# Patient Record
Sex: Female | Born: 1940
Health system: Southern US, Community
[De-identification: ages and names within clinical notes are randomized; demographics above are authoritative.]

## PROBLEM LIST (undated history)

## (undated) DIAGNOSIS — F419 Anxiety disorder, unspecified: Secondary | ICD-10-CM

## (undated) DIAGNOSIS — K449 Diaphragmatic hernia without obstruction or gangrene: Secondary | ICD-10-CM

## (undated) DIAGNOSIS — R234 Changes in skin texture: Secondary | ICD-10-CM

## (undated) DIAGNOSIS — I10 Essential (primary) hypertension: Secondary | ICD-10-CM

## (undated) DIAGNOSIS — G510 Bell's palsy: Secondary | ICD-10-CM

## (undated) DIAGNOSIS — R351 Nocturia: Secondary | ICD-10-CM

## (undated) DIAGNOSIS — E2839 Other primary ovarian failure: Secondary | ICD-10-CM

## (undated) DIAGNOSIS — R011 Cardiac murmur, unspecified: Secondary | ICD-10-CM

## (undated) DIAGNOSIS — L659 Nonscarring hair loss, unspecified: Secondary | ICD-10-CM

## (undated) HISTORY — DX: Anxiety disorder, unspecified: F41.9

## (undated) HISTORY — DX: Bell's palsy: G51.0

## (undated) HISTORY — DX: Changes in skin texture: R23.4

## (undated) HISTORY — PX: ABDOMINAL HYSTERECTOMY: SHX81

## (undated) HISTORY — DX: Other primary ovarian failure: E28.39

## (undated) HISTORY — DX: Diaphragmatic hernia without obstruction or gangrene: K44.9

## (undated) HISTORY — DX: Cardiac murmur, unspecified: R01.1

## (undated) HISTORY — PX: MULTIPLE TOOTH EXTRACTIONS: SHX2053

## (undated) HISTORY — DX: Essential (primary) hypertension: I10

## (undated) HISTORY — DX: Nocturia: R35.1

## (undated) HISTORY — DX: Nonscarring hair loss, unspecified: L65.9

## (undated) HISTORY — PX: TONSILLECTOMY: SUR1361

---

## 2008-04-10 ENCOUNTER — Emergency Department: Payer: Self-pay | Admitting: Internal Medicine

## 2008-04-23 ENCOUNTER — Ambulatory Visit: Payer: Self-pay | Admitting: Family Medicine

## 2008-05-19 ENCOUNTER — Ambulatory Visit: Payer: Self-pay | Admitting: Family Medicine

## 2008-06-10 ENCOUNTER — Ambulatory Visit: Payer: Self-pay | Admitting: Family Medicine

## 2008-08-01 ENCOUNTER — Inpatient Hospital Stay: Payer: Self-pay | Admitting: Internal Medicine

## 2008-12-15 ENCOUNTER — Ambulatory Visit: Payer: Self-pay | Admitting: Family Medicine

## 2009-03-09 ENCOUNTER — Ambulatory Visit: Payer: Self-pay | Admitting: Gastroenterology

## 2009-04-17 LAB — HM COLONOSCOPY: HM Colonoscopy: NORMAL

## 2009-05-21 ENCOUNTER — Ambulatory Visit: Payer: Self-pay | Admitting: Gastroenterology

## 2010-04-12 ENCOUNTER — Emergency Department: Payer: Self-pay | Admitting: Emergency Medicine

## 2011-10-25 ENCOUNTER — Ambulatory Visit: Payer: Self-pay | Admitting: Family Medicine

## 2013-01-01 ENCOUNTER — Ambulatory Visit: Payer: Self-pay | Admitting: Family Medicine

## 2013-01-01 LAB — HM MAMMOGRAPHY: HM Mammogram: NORMAL

## 2013-10-22 LAB — LIPID PANEL
Cholesterol: 186 mg/dL (ref 0–200)
HDL: 63 mg/dL (ref 35–70)
LDL CALC: 108 mg/dL
Triglycerides: 74 mg/dL (ref 40–160)

## 2014-10-27 ENCOUNTER — Ambulatory Visit (INDEPENDENT_AMBULATORY_CARE_PROVIDER_SITE_OTHER): Payer: Medicare PPO | Admitting: Family Medicine

## 2014-10-27 ENCOUNTER — Encounter: Payer: Self-pay | Admitting: Family Medicine

## 2014-10-27 VITALS — BP 126/64 | HR 73 | Temp 98.4°F | Resp 16 | Ht 62.0 in | Wt 142.4 lb

## 2014-10-27 DIAGNOSIS — L658 Other specified nonscarring hair loss: Secondary | ICD-10-CM

## 2014-10-27 DIAGNOSIS — R351 Nocturia: Secondary | ICD-10-CM | POA: Diagnosis not present

## 2014-10-27 DIAGNOSIS — Z1239 Encounter for other screening for malignant neoplasm of breast: Secondary | ICD-10-CM

## 2014-10-27 DIAGNOSIS — G510 Bell's palsy: Secondary | ICD-10-CM | POA: Insufficient documentation

## 2014-10-27 DIAGNOSIS — Z8781 Personal history of (healed) traumatic fracture: Secondary | ICD-10-CM | POA: Insufficient documentation

## 2014-10-27 DIAGNOSIS — Z0000 Encounter for general adult medical examination without abnormal findings: Secondary | ICD-10-CM | POA: Diagnosis not present

## 2014-10-27 DIAGNOSIS — D692 Other nonthrombocytopenic purpura: Secondary | ICD-10-CM | POA: Insufficient documentation

## 2014-10-27 DIAGNOSIS — I10 Essential (primary) hypertension: Secondary | ICD-10-CM | POA: Diagnosis not present

## 2014-10-27 DIAGNOSIS — Z23 Encounter for immunization: Secondary | ICD-10-CM

## 2014-10-27 DIAGNOSIS — K449 Diaphragmatic hernia without obstruction or gangrene: Secondary | ICD-10-CM | POA: Insufficient documentation

## 2014-10-27 DIAGNOSIS — F411 Generalized anxiety disorder: Secondary | ICD-10-CM | POA: Insufficient documentation

## 2014-10-27 MED ORDER — LISINOPRIL 20 MG PO TABS: 20.0000 mg | ORAL_TABLET | Freq: Every day | ORAL | Status: DC

## 2014-10-27 NOTE — Progress Notes (Signed)
Name: Sandra [name]   MRN: [id number]    DOB: 10-11-1940   Date:10/27/2014       Progress Note  Subjective  Chief Complaint  Chief Complaint  Patient presents with  . Annual Exam    HPI  Functional ability/safety issues: No Issues Hearing issues: Addressed  Activities of daily living: Discussed Home safety issues: Advised to remove rug from the kitchen  End Of Life Planning: Offered verbal information regarding advanced directives, healthcare power of attorney.  Preventative care, Health maintenance, Preventative health measures discussed.  Preventative screenings discussed today: lab work, colonoscopy, PAP- no need , mammogram, DEXA - refused.  Low Dose CT Chest recommended if Age 58-80 years, 30 pack-year currently smoking OR have quit w/in 15years.   Lifestyle risk factor issued reviewed: Diet, exercise, weight management, advised patient smoking is not healthy, nutrition/diet.  Preventative health measures discussed (5-10 year plan).  Reviewed and recommended vaccinations: - Pneumovax  - Prevnar  - Annual Influenza  - Zostavax - Tdap   Depression screening: Done Fall risk screening: Done Discuss ADLs/IADLs: Done  Current medical providers: See HPI  Other health risk factors identified this visit: No other issues Cognitive impairment issues: None identified  All above discussed with patient. Appropriate education, counseling and referral will be made based upon the above.    HTN: patient is doing well, bp is at goal, very seldom gets a little dizzy if she gets up from sitting position quickly. She denies chest pain, palpitation, no dry cough  Nocturia: it has been going on for many years, seen by Urologist, does not want to take medications at this time, states able to fall back asleep after voiding.    Patient Active Problem List   Diagnosis Date Noted  . Hiatal hernia 10/27/2014  . Hypertension, benign 10/27/2014  . Female pattern hair loss  10/27/2014  . Nocturia 10/27/2014  . Left-sided Bell's palsy 10/27/2014  . Generalized anxiety disorder 10/27/2014  . Senile purpura (HCC) 10/27/2014    Past Surgical History  Procedure Laterality Date  . Abdominal hysterectomy      Family History  Problem Relation Age of Onset  . Hypertension Daughter   . Hypertension Sister   . Hypertension Brother     Social History   Social History  . Marital Status: Single    Spouse Name: N/A  . Number of Children: N/A  . Years of Education: N/A   Occupational History  . Not on file.   Social History Main Topics  . Smoking status: Never Smoker   . Smokeless tobacco: Never Used  . Alcohol Use: No  . Drug Use: No  . Sexual Activity: Not Currently   Other Topics Concern  . Not on file   Social History Narrative     Current outpatient prescriptions:  .  aspirin 81 MG tablet, Take 81 mg by mouth daily., Disp: , Rfl:  .  lisinopril (PRINIVIL,ZESTRIL) 20 MG tablet, Take 1 tablet (20 mg total) by mouth daily., Disp: 90 tablet, Rfl: 1 .  Multiple Vitamin (MULTIVITAMIN) tablet, Take 1 tablet by mouth daily., Disp: , Rfl:   No Known Allergies   ROS  Constitutional: Negative for fever or weight change.  Respiratory: Negative for cough and shortness of breath.   Cardiovascular: Negative for chest pain or palpitations.  Gastrointestinal: Negative for abdominal pain, no bowel changes.  Musculoskeletal: Negative for gait problem or joint swelling.  Skin: Negative for rash.  Neurological: Negative for dizziness or headache.  No  other specific complaints in a complete review of systems (except as listed in HPI above).  Objective  Filed Vitals:   10/27/14 1145  BP: 126/64  Pulse: 73  Temp: 98.4 F (36.9 C)  TempSrc: Oral  Resp: 16  Height:  (1.575 m)  Weight: 142 lb 6.4 oz (64.592 kg)  SpO2: 99%    Body mass index is 26.04 kg/(m^2).  Physical Exam  Constitutional: Patient appears well-developed and  well-nourished. No distress.  HENT: Head: Normocephalic and atraumatic. Ears: B TMs ok, no erythema or effusion; Nose: Nose normal. Mouth/Throat: Oropharynx is clear and moist. No oropharyngeal exudate.  Eyes: Conjunctivae and EOM are normal. Pupils are equal, round, and reactive to light. No scleral icterus.  Neck: Normal range of motion. Neck supple. No JVD present. No thyromegaly present.  Cardiovascular: Normal rate, regular rhythm and normal heart sounds.  No murmur heard. No BLE edema. Pulmonary/Chest: Effort normal and breath sounds normal. No respiratory distress. Abdominal: Soft. Bowel sounds are normal, no distension. There is no tenderness. no masses Breast: no lumps or masses, no nipple discharge or rashes FEMALE GENITALIA:  Not done RECTAL: not done Musculoskeletal: Normal range of motion, no joint effusions. No gross deformities Neurological: he is alert and oriented to person, place, and time. No cranial nerve deficit. Coordination, balance, strength, speech and gait are normal.  Skin: Skin is warm and dry. Thin skin, and ecchymosis noticed on right arm Psychiatric: Patient has a normal mood and affect. behavior is normal. Judgment and thought content normal.   PHQ2/9: Depression screen PHQ 2/9 10/27/2014  Decreased Interest 0  Down, Depressed, Hopeless 0  PHQ - 2 Score 0    Fall Risk: Fall Risk  10/27/2014  Falls in the past year? No    Functional Status Survey: Is the patient deaf or have difficulty hearing?: No Does the patient have difficulty seeing, even when wearing glasses/contacts?: Yes (glasses) Does the patient have difficulty concentrating, remembering, or making decisions?: No Does the patient have difficulty walking or climbing stairs?: No Does the patient have difficulty dressing or bathing?: No Does the patient have difficulty doing errands alone such as visiting a doctor's office or shopping?: No    Assessment & Plan  1. Medicare annual wellness  visit, subsequent  Discussed importance of 150 minutes of physical activity weekly, eat two servings of fish weekly, eat one serving of tree nuts ( cashews, pistachios, pecans, almonds.Marland Kitchen) every other day, eat 6 servings of fruit/vegetables daily and drink plenty of water and avoid sweet beverages.   2. Needs flu shot  - Flu vaccine HIGH DOSE PF (Fluzone High dose) - refused  3. Senile purpura (HCC)  4. Hypertension, benign   at goal, check labs - Comprehensive metabolic panel - CBC with Differential/Platelet - Lipid panel - lisinopril (PRINIVIL,ZESTRIL) 20 MG tablet; Take 1 tablet (20 mg total) by mouth daily.  Dispense: 90 tablet; Refill: 1  5. Female pattern hair loss  stable  6. Nocturia  No dysuria and stable  7. Breast cancer screening  - MM Digital Screening; Future  8. Need for shingles vaccine  - Varicella-zoster vaccine subcutaneous - refused 9. Need for pneumococcal vaccination  - Pneumococcal conjugate vaccine 13-valent IM   10. Need for Tdap vaccination  - Tdap vaccine greater than or equal to 7yo IM -refused

## 2014-10-28 LAB — CBC WITH DIFFERENTIAL/PLATELET
Basophils Absolute: 0 10*3/uL (ref 0.0–0.2)
Basos: 0 %
EOS (ABSOLUTE): 0 10*3/uL (ref 0.0–0.4)
EOS: 0 %
Hematocrit: 38.6 % (ref 34.0–46.6)
Hemoglobin: 13.1 g/dL (ref 11.1–15.9)
IMMATURE GRANULOCYTES: 0 %
Immature Grans (Abs): 0 10*3/uL (ref 0.0–0.1)
Lymphocytes Absolute: 2 10*3/uL (ref 0.7–3.1)
Lymphs: 27 %
MCH: 30.8 pg (ref 26.6–33.0)
MCHC: 33.9 g/dL (ref 31.5–35.7)
MCV: 91 fL (ref 79–97)
Monocytes Absolute: 0.7 10*3/uL (ref 0.1–0.9)
Monocytes: 9 %
NEUTROS PCT: 64 %
Neutrophils Absolute: 4.6 10*3/uL (ref 1.4–7.0)
PLATELETS: 240 10*3/uL (ref 150–379)
RBC: 4.25 x10E6/uL (ref 3.77–5.28)
RDW: 13.4 % (ref 12.3–15.4)
WBC: 7.3 10*3/uL (ref 3.4–10.8)

## 2014-10-28 LAB — COMPREHENSIVE METABOLIC PANEL
ALBUMIN: 4.5 g/dL (ref 3.5–4.8)
ALT: 14 IU/L (ref 0–32)
AST: 17 IU/L (ref 0–40)
Albumin/Globulin Ratio: 1.6 (ref 1.1–2.5)
Alkaline Phosphatase: 90 IU/L (ref 39–117)
BILIRUBIN TOTAL: 0.4 mg/dL (ref 0.0–1.2)
BUN/Creatinine Ratio: 16 (ref 11–26)
BUN: 11 mg/dL (ref 8–27)
CO2: 22 mmol/L (ref 18–29)
CREATININE: 0.7 mg/dL (ref 0.57–1.00)
Calcium: 9.3 mg/dL (ref 8.7–10.3)
Chloride: 98 mmol/L (ref 97–108)
GFR calc Af Amer: 97 mL/min/{1.73_m2} (ref >59)
GFR calc non Af Amer: 84 mL/min/{1.73_m2} (ref >59)
GLOBULIN, TOTAL: 2.8 g/dL (ref 1.5–4.5)
GLUCOSE: 65 mg/dL (ref 65–99)
Potassium: 4.6 mmol/L (ref 3.5–5.2)
SODIUM: 137 mmol/L (ref 134–144)
Total Protein: 7.3 g/dL (ref 6.0–8.5)

## 2014-10-28 LAB — LIPID PANEL
Chol/HDL Ratio: 2.9 ratio units (ref 0.0–4.4)
Cholesterol, Total: 204 mg/dL — ABNORMAL HIGH (ref 100–199)
HDL: 70 mg/dL (ref >39)
LDL Calculated: 116 mg/dL — ABNORMAL HIGH (ref 0–99)
TRIGLYCERIDES: 88 mg/dL (ref 0–149)
VLDL Cholesterol Cal: 18 mg/dL (ref 5–40)

## 2014-10-28 NOTE — Progress Notes (Signed)
Patient notified

## 2015-04-27 ENCOUNTER — Other Ambulatory Visit: Payer: Self-pay

## 2015-04-27 DIAGNOSIS — I10 Essential (primary) hypertension: Secondary | ICD-10-CM

## 2015-04-27 NOTE — Telephone Encounter (Signed)
Cell phone #[phone]  Patient called stating that she recently got her Lisinopril refilled and went to throw the old bottle in the trash but threw the new bottle away by accident. She wanted to know if [facility] [address]  Refill request was sent to Dr. Alba CoryKrichna Sowles for approval and submission.

## 2015-04-28 ENCOUNTER — Other Ambulatory Visit: Payer: Self-pay | Admitting: Family Medicine

## 2015-04-28 MED ORDER — LISINOPRIL 20 MG PO TABS: 20.0000 mg | ORAL_TABLET | Freq: Every day | ORAL | Status: DC

## 2015-04-28 NOTE — Telephone Encounter (Signed)
Patient requesting refill. 

## 2015-05-04 ENCOUNTER — Ambulatory Visit (INDEPENDENT_AMBULATORY_CARE_PROVIDER_SITE_OTHER): Payer: Medicare PPO | Admitting: Family Medicine

## 2015-05-04 ENCOUNTER — Encounter: Payer: Self-pay | Admitting: Family Medicine

## 2015-05-04 VITALS — BP 122/68 | HR 71 | Temp 98.1°F | Resp 16 | Ht 62.0 in | Wt 140.4 lb

## 2015-05-04 DIAGNOSIS — D692 Other nonthrombocytopenic purpura: Secondary | ICD-10-CM

## 2015-05-04 DIAGNOSIS — L658 Other specified nonscarring hair loss: Secondary | ICD-10-CM | POA: Diagnosis not present

## 2015-05-04 DIAGNOSIS — I10 Essential (primary) hypertension: Secondary | ICD-10-CM

## 2015-05-04 DIAGNOSIS — R350 Frequency of micturition: Secondary | ICD-10-CM | POA: Diagnosis not present

## 2015-05-04 LAB — POCT URINALYSIS DIPSTICK
BILIRUBIN UA: NEGATIVE
Blood, UA: NEGATIVE
GLUCOSE UA: NEGATIVE
Ketones, UA: NEGATIVE
LEUKOCYTES UA: NEGATIVE
NITRITE UA: NEGATIVE
Protein, UA: NEGATIVE
Spec Grav, UA: 1.015
Urobilinogen, UA: NEGATIVE
pH, UA: 6

## 2015-05-04 NOTE — Progress Notes (Signed)
Name: Sandra [name]   MRN: [id number]    DOB: [birth year]   Date:05/04/2015       Progress Note  Subjective  Chief Complaint  Chief Complaint  Patient presents with  . Hypertension    Checks BP at home every so often and gets a normal reading.  . Medication Refill    6 month F/U  . Urinary Frequency    Onset-months, unchanged. Patient states she does drink alot of water and goes to bathroom every 3 hours.     HPI  HTN: patient is doing well, bp is at goal, very seldom gets a little dizzy if she gets up from sitting position quickly. She denies chest pain, palpitation, no dry cough, no SOB  Nocturia: it has been going on for many years, seen by Urologist, does not want to take medications at this time, states able to fallsback asleep after voiding. She states she has urinary frequency because she drinks a lot of water, but wants to have urinalysis that was normal today. She denies dysuria or hematuria.    Patient Active Problem List   Diagnosis Date Noted  . Hiatal hernia 10/27/2014  . Hypertension, benign 10/27/2014  . Female pattern hair loss 10/27/2014  . Nocturia 10/27/2014  . Left-sided Bell's palsy 10/27/2014  . Generalized anxiety disorder 10/27/2014  . Senile purpura (HCC) 10/27/2014    Past Surgical History  Procedure Laterality Date  . Abdominal hysterectomy      Family History  Problem Relation Age of Onset  . Hypertension Daughter   . Hypertension Sister   . Hypertension Brother     Social History   Social History  . Marital Status: Single    Spouse Name: N/A  . Number of Children: N/A  . Years of Education: N/A   Occupational History  . Not on file.   Social History Main Topics  . Smoking status: Never Smoker   . Smokeless tobacco: Never Used  . Alcohol Use: No  . Drug Use: No  . Sexual Activity: Not Currently   Other Topics Concern  . Not on file   Social History Narrative     Current outpatient prescriptions:  .  aspirin 81 MG  tablet, Take 81 mg by mouth daily., Disp: , Rfl:  .  lisinopril (PRINIVIL,ZESTRIL) 20 MG tablet, Take 1 tablet (20 mg total) by mouth daily., Disp: 90 tablet, Rfl: 1 .  Multiple Vitamin (MULTIVITAMIN) tablet, Take 1 tablet by mouth daily., Disp: , Rfl:   No Known Allergies   ROS  Ten systems reviewed and is negative except as mentioned in HPI   Objective  Filed Vitals:   05/04/15 1147  BP: 122/68  Pulse: 71  Temp: 98.1 F (36.7 C)  TempSrc: Oral  Resp: 16  Height:  (1.575 m)  Weight: 140 lb 6.4 oz (63.685 kg)  SpO2: 98%    Body mass index is 25.67 kg/(m^2).  Physical Exam  Constitutional: Patient appears well-developed and well-nourished.  No distress.  HEENT: head atraumatic, normocephalic, pupils equal and reactive to light, neck supple, throat within normal limits Cardiovascular: Normal rate, regular rhythm and normal heart sounds.  No murmur heard. No BLE edema. Pulmonary/Chest: Effort normal and breath sounds normal. No respiratory distress. Abdominal: Soft.  There is no tenderness. Psychiatric: Patient has a normal mood and affect. behavior is normal. Judgment and thought content normal. Skin: she refused to removed her hat  Recent Results (from the past 2160 hour(s))  POCT Urinalysis Dipstick  Status: Normal   Collection Time: 05/04/15 12:02 PM  Result Value Ref Range   Color, UA yellow    Clarity, UA clear    Glucose, UA neg    Bilirubin, UA neg    Ketones, UA neg    Spec Grav, UA 1.015    Blood, UA neg    pH, UA 6.0    Protein, UA neg    Urobilinogen, UA negative    Nitrite, UA neg    Leukocytes, UA Negative Negative     PHQ2/9: [facility] screen [facility] 05/04/2015 10/27/2014  Decreased Interest 0 0  Down, Depressed, Hopeless 0 0  PHQ - 2 Score 0 0     Fall Risk: Fall Risk  05/04/2015 10/27/2014  Falls in the past year? No No      Functional Status Survey: Is the patient deaf or have difficulty hearing?: No Does the patient have  difficulty seeing, even when wearing glasses/contacts?: No Does the patient have difficulty concentrating, remembering, or making decisions?: No Does the patient have difficulty walking or climbing stairs?: No Does the patient have difficulty dressing or bathing?: No Does the patient have difficulty doing errands alone such as visiting a doctor's office or shopping?: No    Assessment & Plan  1. Urinary frequency  Normal ua - POCT Urinalysis Dipstick  2. Senile purpura (HCC)  stable  3. Hypertension, benign  bp is controlled  4. Female pattern hair loss  Wears a hat , does not want to see Dermatologist

## 2015-10-26 ENCOUNTER — Other Ambulatory Visit: Payer: Self-pay | Admitting: Family Medicine

## 2015-10-26 ENCOUNTER — Telehealth: Payer: Self-pay | Admitting: Family Medicine

## 2015-10-26 DIAGNOSIS — I10 Essential (primary) hypertension: Secondary | ICD-10-CM

## 2015-10-26 MED ORDER — LISINOPRIL 20 MG PO TABS: 20.0000 mg | ORAL_TABLET | Freq: Every day | ORAL | 0 refills | Status: DC

## 2015-10-26 NOTE — Telephone Encounter (Signed)
done

## 2015-10-26 NOTE — Telephone Encounter (Signed)
Patient has upcoming appointment (cpe) for 11/02/15. Patient is completely out of Lisinopirl 20mg . Asking that you please send refills to [facility].

## 2015-11-02 ENCOUNTER — Ambulatory Visit (INDEPENDENT_AMBULATORY_CARE_PROVIDER_SITE_OTHER): Payer: Medicare PPO | Admitting: Family Medicine

## 2015-11-02 ENCOUNTER — Encounter: Payer: Self-pay | Admitting: Family Medicine

## 2015-11-02 VITALS — BP 128/60 | HR 86 | Temp 98.4°F | Resp 16 | Ht 62.0 in | Wt 138.8 lb

## 2015-11-02 DIAGNOSIS — D692 Other nonthrombocytopenic purpura: Secondary | ICD-10-CM | POA: Diagnosis not present

## 2015-11-02 DIAGNOSIS — R351 Nocturia: Secondary | ICD-10-CM

## 2015-11-02 DIAGNOSIS — Z0000 Encounter for general adult medical examination without abnormal findings: Secondary | ICD-10-CM

## 2015-11-02 DIAGNOSIS — Z79899 Other long term (current) drug therapy: Secondary | ICD-10-CM | POA: Diagnosis not present

## 2015-11-02 DIAGNOSIS — I10 Essential (primary) hypertension: Secondary | ICD-10-CM | POA: Diagnosis not present

## 2015-11-02 MED ORDER — LISINOPRIL 20 MG PO TABS: 20.0000 mg | ORAL_TABLET | Freq: Every day | ORAL | 0 refills | Status: DC

## 2015-11-02 NOTE — Patient Instructions (Signed)
  [name] , Thank you for taking time to come for your Medicare Wellness Visit. I appreciate your ongoing commitment to your health goals. Please review the following plan we discussed and let me know if I can assist you in the future.   Consider getting immunizations as recommended Increase fish in diet   This is a list of the screening recommended for you and due dates:  Health Maintenance  Topic Date Due  . Flu Shot  08/24/2016*  . Tetanus Vaccine  09/07/2016*  . Shingles Vaccine  09/08/2016*  . DEXA scan (bone density measurement)  01/02/2019*  . Pneumonia vaccines  Completed  *Topic was postponed. The date shown is not the original due date.

## 2015-11-02 NOTE — Progress Notes (Signed)
Name: Sandra [name]   MRN: [id number]    DOB: 01/21/1940   Date:11/02/2015       Progress Note  Subjective  Chief Complaint  Chief Complaint  Patient presents with  . Annual Exam    HPI  Functional ability/safety issues: No Issues Hearing issues: Addressed  Activities of daily living: Discussed Home safety issues: No Issues  End Of Life Planning: Offered verbal information regarding advanced directives, healthcare power of attorney.  Preventative care, Health maintenance, Preventative health measures discussed.  Preventative screenings discussed today: lab work, colonoscopy,  mammogram, DEXA.  Low Dose CT Chest recommended if Age 2-80 years, 30 pack-year currently smoking OR have quit w/in 15years.   Lifestyle risk factor issued reviewed: Diet, exercise, weight management, advised patient smoking is not healthy, nutrition/diet.  Preventative health measures discussed (5-10 year plan).  Reviewed and recommended vaccinations: - Pneumovax  - Prevnar  - Annual Influenza -refused - Zostavax -refused  - Tdap -refused  Depression screening: Done Fall risk screening: Done Discuss ADLs/IADLs: Done  Current medical providers: See HPI  Other health risk factors identified this visit: No other issues Cognitive impairment issues: None identified  All above discussed with patient. Appropriate education, counseling and referral will be made based upon the above.   HTN: she takes lisinopril daily, no chest pain or palpitation, denies decrease in exercise tolerance.  Senile Purpura: stable, small one on right arm, reassurance given to the patient  Nocturia: chronic, twice per night, she states she water at night, we have checked for UTI many times. She denies dysuria, denies odor on her urine  Patient Active Problem List   Diagnosis Date Noted  . Hiatal hernia 10/27/2014  . Hypertension, benign 10/27/2014  . Female pattern hair loss 10/27/2014  . Nocturia 10/27/2014  .  Left-sided Bell's palsy 10/27/2014  . Generalized anxiety disorder 10/27/2014  . Senile purpura (HCC) 10/27/2014    Past Surgical History:  Procedure Laterality Date  . ABDOMINAL HYSTERECTOMY      Family History  Problem Relation Age of Onset  . Hypertension Daughter   . Hypertension Sister   . Hypertension Brother     Social History   Social History  . Marital status: Single    Spouse name: N/A  . Number of children: N/A  . Years of education: N/A   Occupational History  . Not on file.   Social History Main Topics  . Smoking status: Never Smoker  . Smokeless tobacco: Never Used  . Alcohol use No  . Drug use: No  . Sexual activity: Not Currently   Other Topics Concern  . Not on file   Social History Narrative  . No narrative on file     Current Outpatient Prescriptions:  .  aspirin 81 MG tablet, Take 81 mg by mouth daily., Disp: , Rfl:  .  lisinopril (PRINIVIL,ZESTRIL) 20 MG tablet, Take 1 tablet (20 mg total) by mouth daily., Disp: 90 tablet, Rfl: 0 .  Multiple Vitamin (MULTIVITAMIN) tablet, Take 1 tablet by mouth daily., Disp: , Rfl:   No Known Allergies   ROS  Constitutional: Negative for fever or weight change.  Respiratory: Negative for cough and shortness of breath.   Cardiovascular: Negative for chest pain or palpitations.  Gastrointestinal: Negative for abdominal pain, no bowel changes.  Musculoskeletal: Negative for gait problem or joint swelling.  Skin: Negative for rash.  Neurological: Negative for dizziness or headache.  No other specific complaints in a complete review of systems (except as  listed in HPI above).  Objective  Vitals:   11/02/15 1122  BP: 128/60  Pulse: 86  Resp: 16  Temp: 98.4 F (36.9 C)  TempSrc: Oral  SpO2: 98%  Weight: 138 lb 12.8 oz (63 kg)  Height: 5\' 2"  (1.575 m)    Body mass index is 25.39 kg/m.  Physical Exam  Constitutional: Patient appears well-developed and well-nourished. No distress.  HENT:  Head: Normocephalic and atraumatic. Ears: B TMs ok, no erythema or effusion; Nose: Nose normal. Mouth/Throat: Oropharynx is clear and moist. No oropharyngeal exudate.  Eyes: Conjunctivae and EOM are normal. Pupils are equal, round, and reactive to light. No scleral icterus.  Neck: Normal range of motion. Neck supple. No JVD present. No thyromegaly present.  Cardiovascular: Normal rate, regular rhythm and normal heart sounds.  SEM 1/6. No BLE edema. Pulmonary/Chest: Effort normal and breath sounds normal. No respiratory distress. Abdominal: Soft. Bowel sounds are normal, no distension. There is no tenderness. no masses Breast: no lumps or masses, no nipple discharge or rashes FEMALE GENITALIA:  External genitalia normal External urethra normal Pelvic exam not done RECTAL: normal external exam, rectal not done  Musculoskeletal: Normal range of motion, no joint effusions. No gross deformities Neurological: he is alert and oriented to person, place, and time. No cranial nerve deficit. Coordination, balance, strength, speech and gait are normal.  Skin: Skin is warm and dry. No rash noted. No erythema.  Psychiatric: Patient has a normal mood and affect. behavior is normal. Judgment and thought content normal.  PHQ2/9: Depression screen [facility] 2/9 11/02/2015 05/04/2015 10/27/2014  Decreased Interest 0 0 0  Down, Depressed, Hopeless 0 0 0  PHQ - 2 Score 0 0 0    Fall Risk: Fall Risk  11/02/2015 05/04/2015 10/27/2014  Falls in the past year? No No No    Functional Status Survey: Is the patient deaf or have difficulty hearing?: No Does the patient have difficulty seeing, even when wearing glasses/contacts?: No Does the patient have difficulty concentrating, remembering, or making decisions?: No Does the patient have difficulty walking or climbing stairs?: No Does the patient have difficulty dressing or bathing?: No Does the patient have difficulty doing errands alone such as visiting a doctor's  office or shopping?: No    Assessment & Plan  1. Medicare annual wellness visit, subsequent  Discussed importance of 150 minutes of physical activity weekly, eat two servings of fish weekly, eat one serving of tree nuts ( cashews, pistachios, pecans, almonds.Marland Kitchen) every other day, eat 6 servings of fruit/vegetables daily and drink plenty of water and avoid sweet beverages.   2. Senile purpura (HCC)  reassurance  3. Hypertension, benign  - lisinopril (PRINIVIL,ZESTRIL) 20 MG tablet; Take 1 tablet (20 mg total) by mouth daily.  Dispense: 90 tablet; Refill: 0 - COMPLETE METABOLIC PANEL WITH GFR  4. Long-term use of high-risk medication  - COMPLETE METABOLIC PANEL WITH GFR   5. Nocturia  - Urine Culture

## 2015-11-03 LAB — URINE CULTURE

## 2015-11-05 ENCOUNTER — Other Ambulatory Visit: Payer: Self-pay

## 2015-11-05 DIAGNOSIS — E871 Hypo-osmolality and hyponatremia: Secondary | ICD-10-CM

## 2015-11-05 LAB — COMPLETE METABOLIC PANEL WITH GFR
ALBUMIN: 4.1 g/dL (ref 3.6–5.1)
ALK PHOS: 68 U/L (ref 33–130)
ALT: 14 U/L (ref 6–29)
AST: 18 U/L (ref 10–35)
BILIRUBIN TOTAL: 0.5 mg/dL (ref 0.2–1.2)
BUN: 13 mg/dL (ref 7–25)
CALCIUM: 9.2 mg/dL (ref 8.6–10.4)
CO2: 23 mmol/L (ref 20–31)
Chloride: 97 mmol/L — ABNORMAL LOW (ref 98–110)
Creat: 0.85 mg/dL (ref 0.60–0.93)
GFR, EST AFRICAN AMERICAN: 76 mL/min (ref >=60)
GFR, EST NON AFRICAN AMERICAN: 66 mL/min (ref >=60)
Glucose, Bld: 94 mg/dL (ref 65–99)
POTASSIUM: 4.6 mmol/L (ref 3.5–5.3)
SODIUM: 132 mmol/L — AB (ref 135–146)
TOTAL PROTEIN: 7.1 g/dL (ref 6.1–8.1)

## 2015-11-16 DIAGNOSIS — E871 Hypo-osmolality and hyponatremia: Secondary | ICD-10-CM | POA: Diagnosis not present

## 2015-11-17 LAB — SODIUM: Sodium: 132 mmol/L — ABNORMAL LOW (ref 135–146)

## 2015-11-23 ENCOUNTER — Other Ambulatory Visit: Payer: Self-pay | Admitting: Family Medicine

## 2015-11-23 DIAGNOSIS — E871 Hypo-osmolality and hyponatremia: Secondary | ICD-10-CM

## 2015-12-22 DIAGNOSIS — I10 Essential (primary) hypertension: Secondary | ICD-10-CM | POA: Diagnosis not present

## 2015-12-22 DIAGNOSIS — E871 Hypo-osmolality and hyponatremia: Secondary | ICD-10-CM | POA: Diagnosis not present

## 2016-03-08 DIAGNOSIS — E871 Hypo-osmolality and hyponatremia: Secondary | ICD-10-CM | POA: Diagnosis not present

## 2016-03-08 DIAGNOSIS — I10 Essential (primary) hypertension: Secondary | ICD-10-CM | POA: Diagnosis not present

## 2016-03-15 DIAGNOSIS — E871 Hypo-osmolality and hyponatremia: Secondary | ICD-10-CM | POA: Diagnosis not present

## 2016-03-15 DIAGNOSIS — I10 Essential (primary) hypertension: Secondary | ICD-10-CM | POA: Diagnosis not present

## 2016-04-04 ENCOUNTER — Other Ambulatory Visit: Payer: Self-pay | Admitting: Family Medicine

## 2016-04-04 DIAGNOSIS — I10 Essential (primary) hypertension: Secondary | ICD-10-CM

## 2016-04-04 MED ORDER — LISINOPRIL 20 MG PO TABS: 20.0000 mg | ORAL_TABLET | Freq: Every day | ORAL | 0 refills | Status: DC

## 2016-04-04 NOTE — Telephone Encounter (Signed)
Pt has appt scheduled but will be completely out of lisinopril before her appt. Asking that you send a refill to [facility]. [phone]

## 2016-05-02 ENCOUNTER — Ambulatory Visit (INDEPENDENT_AMBULATORY_CARE_PROVIDER_SITE_OTHER): Payer: Medicare PPO | Admitting: Family Medicine

## 2016-05-02 ENCOUNTER — Encounter: Payer: Self-pay | Admitting: Family Medicine

## 2016-05-02 VITALS — BP 126/58 | HR 75 | Temp 98.1°F | Resp 16 | Ht 62.0 in | Wt 140.0 lb

## 2016-05-02 DIAGNOSIS — I10 Essential (primary) hypertension: Secondary | ICD-10-CM | POA: Diagnosis not present

## 2016-05-02 DIAGNOSIS — R351 Nocturia: Secondary | ICD-10-CM | POA: Diagnosis not present

## 2016-05-02 DIAGNOSIS — E871 Hypo-osmolality and hyponatremia: Secondary | ICD-10-CM | POA: Diagnosis not present

## 2016-05-02 DIAGNOSIS — D692 Other nonthrombocytopenic purpura: Secondary | ICD-10-CM | POA: Diagnosis not present

## 2016-05-02 MED ORDER — LISINOPRIL 20 MG PO TABS: 20.0000 mg | ORAL_TABLET | Freq: Every day | ORAL | 1 refills | Status: DC

## 2016-05-02 NOTE — Progress Notes (Signed)
Name: Sandra [name]   MRN: [id number]    DOB: Jan 28, 1940   Date:05/02/2016       Progress Note  Subjective  Chief Complaint  Chief Complaint  Patient presents with  . Medication Refill    6 month F/U  . Hypertension    Denies any symptoms    HPI   HTN: she takes lisinopril daily, no chest pain or palpitation, denies decrease in exercise tolerance. She denies orthostatic changes. BP at home 120-130's . She is off HCTZ  Senile Purpura: stable,  reassurance given to the patient  Nocturia: chronic, twice per night, she states drinks  water at night, we have checked for UTI many times, always negative. She denies dysuria, denies odor on her urine, no change in urinary frequency  Hyponatremia: she saw Dr. Cherylann Ratel, advised to decrease water intake and increase salt intake, she will follow up with him.   Patient Active Problem List   Diagnosis Date Noted  . Hiatal hernia 10/27/2014  . Hypertension, benign 10/27/2014  . Female pattern hair loss 10/27/2014  . Nocturia 10/27/2014  . Left-sided Bell's palsy 10/27/2014  . Generalized anxiety disorder 10/27/2014  . Senile purpura (HCC) 10/27/2014    Past Surgical History:  Procedure Laterality Date  . ABDOMINAL HYSTERECTOMY      Family History  Problem Relation Age of Onset  . Hypertension Daughter   . Hypertension Sister   . Hypertension Brother     Social History   Social History  . Marital status: Single    Spouse name: N/A  . Number of children: N/A  . Years of education: N/A   Occupational History  . Not on file.   Social History Main Topics  . Smoking status: Never Smoker  . Smokeless tobacco: Never Used  . Alcohol use No  . Drug use: No  . Sexual activity: Not Currently   Other Topics Concern  . Not on file   Social History Narrative   She is watching her 78 yo great-great grandson      Current Outpatient Prescriptions:  .  aspirin 81 MG tablet, Take 81 mg by mouth daily., Disp: , Rfl:  .   lisinopril (PRINIVIL,ZESTRIL) 20 MG tablet, Take 1 tablet (20 mg total) by mouth daily., Disp: 90 tablet, Rfl: 1 .  Multiple Vitamin (MULTIVITAMIN) tablet, Take 1 tablet by mouth daily., Disp: , Rfl:   No Known Allergies   ROS  Constitutional: Negative for fever or weight change.  Respiratory: Negative for cough and shortness of breath.   Cardiovascular: Negative for chest pain or palpitations.  Gastrointestinal: Negative for abdominal pain, no bowel changes.  Musculoskeletal: Negative for gait problem or joint swelling.  Skin: Negative for rash.  Neurological: Negative for dizziness or headache.  No other specific complaints in a complete review of systems (except as listed in HPI above).   Objective  Vitals:   05/02/16 1138  BP: (!) 126/58  Pulse: 75  Resp: 16  Temp: 98.1 F (36.7 C)  TempSrc: Oral  SpO2: 98%  Weight: 140 lb (63.5 kg)  Height:  (1.575 m)    Body mass index is 25.61 kg/m.  Physical Exam  Constitutional: Patient appears well-developed and well-nourished. No distress.  HEENT: head atraumatic, normocephalic, pupils equal and reactive to light,  neck supple, throat within normal limits Cardiovascular: Normal rate, regular rhythm and normal heart sounds.  No murmur heard. Trace BLE edema. Pulmonary/Chest: Effort normal and breath sounds normal. No respiratory distress. Abdominal: Soft.  There is no tenderness. Psychiatric: Patient has a normal mood and affect. behavior is normal. Judgment and thought content normal. Skin: purpura senile on right dorsal hand  PHQ2/9: Depression screen [facility] 2/9 05/02/2016 11/02/2015 05/04/2015 10/27/2014  Decreased Interest 0 0 0 0  Down, Depressed, Hopeless 0 0 0 0  PHQ - 2 Score 0 0 0 0     Fall Risk: Fall Risk  05/02/2016 11/02/2015 05/04/2015 10/27/2014  Falls in the past year? No No No No     Functional Status Survey: Is the patient deaf or have difficulty hearing?: No Does the patient have difficulty  seeing, even when wearing glasses/contacts?: No Does the patient have difficulty concentrating, remembering, or making decisions?: No Does the patient have difficulty walking or climbing stairs?: No Does the patient have difficulty dressing or bathing?: No Does the patient have difficulty doing errands alone such as visiting a doctor's office or shopping?: No    Assessment & Plan  1. Hypertension, benign  bp towards low end of normal but she denies orthostatic changes - lisinopril (PRINIVIL,ZESTRIL) 20 MG tablet; Take 1 tablet (20 mg total) by mouth daily.  Dispense: 90 tablet; Refill: 1  2. Senile purpura (HCC)  Stable, she only takes aspirin a few times weekly because of it  3. Hyponatremia  Seen by nephrologist and advised to increase salt intake She is afraid to do it because of her bp. Trying to buy food with higher salt contacted  4. Nocturia  stable

## 2016-11-08 ENCOUNTER — Ambulatory Visit (INDEPENDENT_AMBULATORY_CARE_PROVIDER_SITE_OTHER): Payer: Medicare PPO | Admitting: Family Medicine

## 2016-11-08 ENCOUNTER — Encounter: Payer: Self-pay | Admitting: Family Medicine

## 2016-11-08 VITALS — BP 130/56 | HR 79 | Temp 98.1°F | Resp 16 | Ht 61.0 in | Wt 145.5 lb

## 2016-11-08 DIAGNOSIS — R351 Nocturia: Secondary | ICD-10-CM

## 2016-11-08 DIAGNOSIS — Z0000 Encounter for general adult medical examination without abnormal findings: Secondary | ICD-10-CM

## 2016-11-08 DIAGNOSIS — Z23 Encounter for immunization: Secondary | ICD-10-CM | POA: Diagnosis not present

## 2016-11-08 DIAGNOSIS — E871 Hypo-osmolality and hyponatremia: Secondary | ICD-10-CM

## 2016-11-08 DIAGNOSIS — E785 Hyperlipidemia, unspecified: Secondary | ICD-10-CM | POA: Diagnosis not present

## 2016-11-08 DIAGNOSIS — Z79899 Other long term (current) drug therapy: Secondary | ICD-10-CM | POA: Diagnosis not present

## 2016-11-08 DIAGNOSIS — I10 Essential (primary) hypertension: Secondary | ICD-10-CM

## 2016-11-08 DIAGNOSIS — D692 Other nonthrombocytopenic purpura: Secondary | ICD-10-CM | POA: Diagnosis not present

## 2016-11-08 LAB — CBC WITH DIFFERENTIAL/PLATELET
BASOS ABS: 39 {cells}/uL (ref 0–200)
Basophils Relative: 0.6 %
Eosinophils Absolute: 20 cells/uL (ref 15–500)
Eosinophils Relative: 0.3 %
HEMATOCRIT: 36 % (ref 35.0–45.0)
Hemoglobin: 12.3 g/dL (ref 11.7–15.5)
LYMPHS ABS: 1658 {cells}/uL (ref 850–3900)
MCH: 31.1 pg (ref 27.0–33.0)
MCHC: 34.2 g/dL (ref 32.0–36.0)
MCV: 90.9 fL (ref 80.0–100.0)
MPV: 10.2 fL (ref 7.5–12.5)
Monocytes Relative: 9.4 %
NEUTROS PCT: 64.2 %
Neutro Abs: 4173 cells/uL (ref 1500–7800)
Platelets: 234 10*3/uL (ref 140–400)
RBC: 3.96 10*6/uL (ref 3.80–5.10)
RDW: 12.1 % (ref 11.0–15.0)
Total Lymphocyte: 25.5 %
WBC: 6.5 10*3/uL (ref 3.8–10.8)
WBCMIX: 611 {cells}/uL (ref 200–950)

## 2016-11-08 LAB — COMPLETE METABOLIC PANEL WITH GFR
AG RATIO: 1.6 (calc) (ref 1.0–2.5)
ALBUMIN MSPROF: 4.2 g/dL (ref 3.6–5.1)
ALKALINE PHOSPHATASE (APISO): 71 U/L (ref 33–130)
ALT: 14 U/L (ref 6–29)
AST: 18 U/L (ref 10–35)
BUN: 14 mg/dL (ref 7–25)
CALCIUM: 9.1 mg/dL (ref 8.6–10.4)
CHLORIDE: 98 mmol/L (ref 98–110)
CO2: 24 mmol/L (ref 20–32)
Creat: 0.79 mg/dL (ref 0.60–0.93)
GFR, EST NON AFRICAN AMERICAN: 72 mL/min/{1.73_m2} (ref >=60)
GFR, Est African American: 83 mL/min/{1.73_m2} (ref >=60)
GLOBULIN: 2.7 g/dL (ref 1.9–3.7)
Glucose, Bld: 86 mg/dL (ref 65–139)
POTASSIUM: 4.5 mmol/L (ref 3.5–5.3)
Sodium: 132 mmol/L — ABNORMAL LOW (ref 135–146)
Total Bilirubin: 0.6 mg/dL (ref 0.2–1.2)
Total Protein: 6.9 g/dL (ref 6.1–8.1)

## 2016-11-08 MED ORDER — LISINOPRIL 20 MG PO TABS: 20.0000 mg | ORAL_TABLET | Freq: Every day | ORAL | 1 refills | Status: DC

## 2016-11-08 NOTE — Progress Notes (Signed)
Patient: Sandra [name], Female    DOB: 09/15/1940, 76 y.o.   MRN: [id number]  Visit Date: 11/08/2016  Today's Provider: Ruel FavorsKrichna F Keelin Sheridan, MD   Chief Complaint  Patient presents with  . Medicare Wellness    Subjective:    HPI Sandra [name] is a 76 y.o. female who presents today for her Subsequent Annual Wellness Visit and follow up.  Patient/Caregiver input:    HTN: she is taking mediation as prescribed, bp is at goal , denies side effects. She has a long history of nocturia but is chronic.   Dyslipidemia: not interested in medications or having labs done  Hyponatremia : last level was low around 132  She states that she drinks 32 ounces of water daily. No headaches , she occasionally gets dizzy when she gets up quickly.    Review of Systems  Constitutional: Negative for fever or weight change.  Respiratory: Negative for cough and shortness of breath.   Cardiovascular: Negative for chest pain or palpitations.  Gastrointestinal: Negative for abdominal pain, no bowel changes.  Musculoskeletal: Negative for gait problem or joint swelling.  Skin: Negative for rash.  Neurological: Negative for dizziness or headache.  No other specific complaints in a complete review of systems (except as listed in HPI above).  Past Medical History:  Diagnosis Date  . Anxiety   . Bell's palsy   . Changes in skin texture   . Hair loss   . Hiatal hernia   . Hypertension   . Murmur, cardiac   . Nocturia   . Ovarian failure     Past Surgical History:  Procedure Laterality Date  . ABDOMINAL HYSTERECTOMY    . TONSILLECTOMY      Family History  Problem Relation Age of Onset  . Hypertension Daughter   . Hypertension Sister   . Hypertension Brother     Social History   Social History  . Marital status: Single    Spouse name: N/A  . Number of children: N/A  . Years of education: N/A   Occupational History  . Not on file.   Social History Main Topics  . Smoking status:  Never Smoker  . Smokeless tobacco: Never Used  . Alcohol use No  . Drug use: No  . Sexual activity: Not Currently   Other Topics Concern  . Not on file   Social History Narrative   [facility] independent living [facility]       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       She is watching her 78 yo great-great grandson- she goes to his house in [place] and spends all week there    Outpatient Encounter Prescriptions as of 11/08/2016  Medication Sig  . aspirin 81 MG tablet Take 81 mg by mouth daily.  Marland Kitchen. lisinopril (PRINIVIL,ZESTRIL) 20 MG tablet Take 1 tablet (20 mg total) by mouth daily.  . Multiple Vitamin (MULTIVITAMIN) tablet Take 1 tablet by mouth daily.  . [DISCONTINUED] lisinopril (PRINIVIL,ZESTRIL) 20 MG tablet Take 1 tablet (20 mg total) by mouth daily.   No facility-administered encounter medications on file as of 11/08/2016.     No Known Allergies  Care Team Updated in EHR: Yes  Last Vision Exam: [facility], not sure of date,  Wears corrective lenses: Yes Last Dental Exam: dentures  Last Hearing Exam: not recently Wears Hearing Aids: No  Functional Ability / Safety Screening 1.  Was the timed Get Up and Go test  shorter than 30 seconds?  yes 2.  Does the patient need help with the phone, transportation, shopping,      preparing meals, housework, laundry, medications, or managing money?  no 3.  Is the patient's home free of loose throw rugs in walkways, pet beds, electrical cords, etc?   yes      Grab bars in the bathroom? yes      Handrails on the stairs?   yes      Adequate lighting?   yes 4.  Has the patient noticed any hearing difficulties?   no  Diet Recall and Exercise Regimen:  Current Exercise Habits: Home exercise routine Andree Elk three old grandson), Type of exercise: walking   Usually eats healthy and balanced  Advanced Care Planning: A voluntary discussion about advance care planning including the explanation and discussion  of advance directives.  Discussed health care proxy and Living will, and the patient was able to identify a health care proxy as daughter Evern Bio) and son Charyl Bigger ) .  Patient does not have a living will at present time. If patient does have living will, I have requested they bring this to the clinic to be scanned in to their chart. Does patient have a HCPOA?    no If yes, name and contact information:  Does patient have a living will or MOST form? No   Cancer Screenings:  Lung:  Low Dose CT Chest recommended if Age 49-80 years, 30 pack-year currently smoking OR have quit w/in 15years. Patient does not qualify. Breast: she refused  Up to date on Mammogram? No- refused   Up to date of Bone Density/Dexa? No - refuses Colon: she will live 10 plus years, but not interested in having colonoscopy   Additional Screenings:   Hepatitis C Screening: done Intimate Partner Violence: N/A  Objective:   Vitals: BP (!) 130/56 (BP Location: Right Arm, Patient Position: Sitting, Cuff Size: Large)   Pulse 79   Temp 98.1 F (36.7 C) (Oral)   Resp 16   Ht 5\' 1"  (1.549 m)   Wt 145 lb 8 oz (66 kg)   SpO2 97%   BMI 27.49 kg/m  Body mass index is 27.49 kg/m.  No exam data present  Physical Exam Constitutional: Patient appears well-developed and well-nourished. No distress.  HEENT: head atraumatic, normocephalic, pupils equal and reactive to light,  neck supple, throat within normal limits Cardiovascular: Normal rate, regular rhythm and normal heart sounds, opening click ( refuses EKG ).  No murmur heard. No BLE edema. Pulmonary/Chest: Effort normal and breath sounds normal. No respiratory distress. Abdominal: Soft.  There is no tenderness. Psychiatric: Patient has a normal mood and affect. behavior is normal. Judgment and thought content normal.  Cognitive Testing - 6-CIT  Correct? Score   What year is it? yes 0 Yes = 0    No = 4  What month is it? yes 0 Yes = 0    No = 3  Remember:      Floyde Parkins, [address], [place]     What time is it? yes 0 Yes = 0    No = 3  Count backwards from 20 to 1 yes 0 Correct = 0    1 error = 2   More than 1 error = 4  Say the months of the year in reverse. yes 0 Correct = 0    1 error = 2   More than 1 error = 4  What address did I ask you to remember? yes 0 Correct = 0  1 error = 2    2 error = 4    3 error = 6    4 error = 8    All wrong = 10       TOTAL SCORE  0/28   Interpretation:  0  Normal (0-7) Abnormal (8-28)   Fall Risk: Fall Risk  11/08/2016 05/02/2016 11/02/2015 05/04/2015 10/27/2014  Falls in the past year? No No No No No    Depression Screen Depression screen [facility] 2/9 11/08/2016 05/02/2016 11/02/2015 05/04/2015 10/27/2014  Decreased Interest 0 0 0 0 0  Down, Depressed, Hopeless 0 0 0 0 0  PHQ - 2 Score 0 0 0 0 0    No results found for this or any previous visit (from the past 2160 hour(s)).  Assessment & Plan:    1. Medicare annual wellness visit, subsequent  Discussed importance of 150 minutes of physical activity weekly, eat two servings of fish weekly, eat one serving of tree nuts ( cashews, pistachios, pecans, almonds.Marland Kitchen) every other day, eat 6 servings of fruit/vegetables daily and drink plenty of water and avoid sweet beverages.   2. Need for immunization against influenza  refused  3. Senile purpura (HCC)  stable  4. Long-term use of high-risk medication  -comp panel  -CBC   5. Hypertension, benign  - lisinopril (PRINIVIL,ZESTRIL) 20 MG tablet; Take 1 tablet (20 mg total) by mouth daily.  Dispense: 90 tablet; Refill: 1 - CBC with Differential/Platelet - COMPLETE METABOLIC PANEL WITH GFR  6. Nocturia  stable  7. Hyponatremia  Recheck labs  8. Dyslipidemia  Refuses labs or medications  9. Needs flu shot  refused  10. Need for diphtheria-tetanus-pertussis (Tdap) vaccine  Refused   Exercise Activities and Dietary recommendations  - Discussed health benefits of physical  activity, and encouraged her to engage in regular exercise appropriate for her age and condition.   Immunization History  Administered Date(s) Administered  . Pneumococcal Conjugate-13 10/27/2014  . Pneumococcal Polysaccharide-23 11/22/2010    Health Maintenance  Topic Date Due  . TETANUS/TDAP  04/18/1957  . INFLUENZA VACCINE  12/16/2016 (Originally 08/17/2016)  . DEXA SCAN  01/02/2019 (Originally 04/19/2003)  . PNA vac Low Risk Adult  Completed    Meds ordered this encounter  Medications  . lisinopril (PRINIVIL,ZESTRIL) 20 MG tablet    Sig: Take 1 tablet (20 mg total) by mouth daily.    Dispense:  90 tablet    Refill:  1    Current Outpatient Prescriptions:  .  aspirin 81 MG tablet, Take 81 mg by mouth daily., Disp: , Rfl:  .  lisinopril (PRINIVIL,ZESTRIL) 20 MG tablet, Take 1 tablet (20 mg total) by mouth daily., Disp: 90 tablet, Rfl: 1 .  Multiple Vitamin (MULTIVITAMIN) tablet, Take 1 tablet by mouth daily., Disp: , Rfl:  Medications Discontinued During This Encounter  Medication Reason  . lisinopril (PRINIVIL,ZESTRIL) 20 MG tablet Reorder    I have personally reviewed and addressed the Medicare Annual Wellness health risk assessment questionnaire and have noted the following in the patient's chart:  A.         Medical and social history & family history B.         Use of alcohol, tobacco, and illicit drugs  C.         Current medications and supplements D.         Functional and Cognitive ability and status E.  Nutritional status F.         Physical activity G.        Advance directives H.         List of other physicians I.          Hospitalizations, surgeries, and ER visits in previous 12 months J.         Vitals K.         Screenings such as hearing, vision, cognitive function, and depression L.         Referrals and appointments: none  In addition, I have reviewed and discussed with patient certain preventive protocols, quality metrics, and best practice  recommendations. A written personalized care plan for preventive services as well as general preventive health recommendations were provided to patient.  See attached scanned questionnaire for additional information.   6 months

## 2016-11-08 NOTE — Patient Instructions (Signed)
Preventive Care 65 Years and Older, Female Preventive care refers to lifestyle choices and visits with your health care provider that can promote health and wellness. What does preventive care include?  A yearly physical exam. This is also called an annual well check.  Dental exams once or twice a year.  Routine eye exams. Ask your health care provider how often you should have your eyes checked.  Personal lifestyle choices, including: ? Daily care of your teeth and gums. ? Regular physical activity. ? Eating a healthy diet. ? Avoiding tobacco and drug use. ? Limiting alcohol use. ? Practicing safe sex. ? Taking low-dose aspirin every day. ? Taking vitamin and mineral supplements as recommended by your health care provider. What happens during an annual well check? The services and screenings done by your health care provider during your annual well check will depend on your age, overall health, lifestyle risk factors, and family history of disease. Counseling Your health care provider may ask you questions about your:  Alcohol use.  Tobacco use.  Drug use.  Emotional well-being.  Home and relationship well-being.  Sexual activity.  Eating habits.  History of falls.  Memory and ability to understand (cognition).  Work and work environment.  Reproductive health.  Screening You may have the following tests or measurements:  Height, weight, and BMI.  Blood pressure.  Lipid and cholesterol levels. These may be checked every 5 years, or more frequently if you are over 50 years old.  Skin check.  Lung cancer screening. You may have this screening every year starting at age 55 if you have a 30-pack-year history of smoking and currently smoke or have quit within the past 15 years.  Fecal occult blood test (FOBT) of the stool. You may have this test every year starting at age 50.  Flexible sigmoidoscopy or colonoscopy. You may have a sigmoidoscopy every 5 years or  a colonoscopy every 10 years starting at age 50.  Hepatitis C blood test.  Hepatitis B blood test.  Sexually transmitted disease (STD) testing.  Diabetes screening. This is done by checking your blood sugar (glucose) after you have not eaten for a while (fasting). You may have this done every 1-3 years.  Bone density scan. This is done to screen for osteoporosis. You may have this done starting at age 78.  Mammogram. This may be done every 1-2 years. Talk to your health care provider about how often you should have regular mammograms.  Talk with your health care provider about your test results, treatment options, and if necessary, the need for more tests. Vaccines Your health care provider may recommend certain vaccines, such as:  Influenza vaccine. This is recommended every year.  Tetanus, diphtheria, and acellular pertussis (Tdap, Td) vaccine. You may need a Td booster every 10 years.  Varicella vaccine. You may need this if you have not been vaccinated.  Zoster vaccine. You may need this after age 60.  Measles, mumps, and rubella (MMR) vaccine. You may need at least one dose of MMR if you were born in 1957 or later. You may also need a second dose.  Pneumococcal 13-valent conjugate (PCV13) vaccine. One dose is recommended after age 78.  Pneumococcal polysaccharide (PPSV23) vaccine. One dose is recommended after age 78.  Meningococcal vaccine. You may need this if you have certain conditions.  Hepatitis A vaccine. You may need this if you have certain conditions or if you travel or work in places where you may be exposed to hepatitis   A.  Hepatitis B vaccine. You may need this if you have certain conditions or if you travel or work in places where you may be exposed to hepatitis B.  Haemophilus influenzae type b (Hib) vaccine. You may need this if you have certain conditions.  Talk to your health care provider about which screenings and vaccines you need and how often you  need them. This information is not intended to replace advice given to you by your health care provider. Make sure you discuss any questions you have with your health care provider. Document Released: 01/30/2015 Document Revised: 09/23/2015 Document Reviewed: 11/04/2014 Elsevier Interactive Patient Education  2017 [employer].

## 2016-11-09 ENCOUNTER — Telehealth: Payer: Self-pay

## 2016-11-09 NOTE — Telephone Encounter (Signed)
Unable to reach patient by phone to go over lab results.

## 2016-11-09 NOTE — Telephone Encounter (Signed)
-----   Message from Alba CoryKrichna Sowles, MD sent at 11/08/2016  8:30 PM EDT ----- Normal CBC Normal glucose , kidney and liver function test Sodium is still slightly low, it is recommended to have CXR and order some other labs, please ask if she is interested.

## 2016-11-09 NOTE — Telephone Encounter (Signed)
-----   Message from Krichna Sowles, MD sent at 11/08/2016  8:30 PM EDT ----- Normal CBC Normal glucose , kidney and liver function test Sodium is still slightly low, it is recommended to have CXR and order some other labs, please ask if she is interested. 

## 2016-11-10 ENCOUNTER — Telehealth: Payer: Self-pay | Admitting: Family Medicine

## 2016-11-10 NOTE — Telephone Encounter (Signed)
Copied from CRM [phone]. Topic: Inquiry >> Nov 10, 2016 12:50 PM Cipriano BunkerLambe, Annette S wrote: Reason for CRM: she had spoke to nurse regarding test results. She want to only speak to Dr. Carlynn PurlSowles nurse

## 2016-11-11 ENCOUNTER — Ambulatory Visit: Payer: Self-pay

## 2016-11-11 ENCOUNTER — Telehealth: Payer: Self-pay | Admitting: Family Medicine

## 2016-11-11 NOTE — Telephone Encounter (Deleted)
Lab results given to pt.Telephone   [facility]/2016 [facility]  Phineas SemenJohnson, Tiffany, [place]   Results   Reason for call   Conversation: Results  (Newest Message First)  Phineas SemenJohnson, Tiffany, [facility]      11/09/16 8:28 AM  Note    ----- Message from Alba CoryKrichna Sowles, MD sent at 11/08/2016  8:30 PM EDT ----- Normal CBC Normal glucose , kidney and liver function test Sodium is still slightly low, it is recommended to have CXR and order some other labs, please ask if she is interested.     Phineas SemenJohnson, Tiffany, [facility]      11/09/16 8:26 AM  Note    Unable to reach patient by phone to go over lab results.          11/09/16 8:26 AM    Phineas SemenJohnson, Tiffany, CMA attempted to contact [place] (Not Available)  Phineas SemenJohnson, Tiffany, [facility]      11/09/16 8:26 AM  Note    ----- Message from Alba CoryKrichna Sowles, MD sent at 11/08/2016  8:30 PM EDT ----- Normal CBC Normal glucose , kidney and liver function test Sodium is still slightly low, it is recommended to have CXR and order some other labs, please ask if she is interested.     Additional Documentation   Encounter Info:   Billing Info,   History,   Allergies,   Detailed Report     Orders Placed    None  Medication Renewals and Changes     None    Medication List  Visit Diagnoses     None    Problem List

## 2016-11-11 NOTE — Telephone Encounter (Deleted)
Pt.  Sandra [name], Sandra [name] Preferred Name:  None Female, 76 y.o., 07/31/1940 Last Weight:  145 lb 8 oz (66 kg) Weight:  145 lb 8 oz (66 kg) Phone:  [phone] (Home Phone) PCP:  Alba CorySowles, Krichna, MD Language:  Lenox PondsEnglish Need Interpreter:  None Allergies:  No Known Allergies Health Maintenance Due?:  Health Maintenance Active FYIs:  None Primary Ins.:  None MRN:  [id number] MyChart:  Pending Next Appt:  05/16/2017     Results (labs)   [name], [name] routed conversation to [facility] 8 minutes ago (8:03 AM)    [name], [name] 9 minutes ago (8:03 AM)      Copied from CRM [id number]. Topic: Quick Communication - See Telephone Encounter >> Nov 11, 2016  8:02 AM [name], [name] wrote: CRM for notification. See Telephone encounter for: 11/11/16. Patient called to know what her lab results are.       Documentation     Sandra [name] [phone]  [name], [name] 9 minutes ago (8:02 AM)             Called to get lab results.

## 2016-11-11 NOTE — Telephone Encounter (Signed)
Pt. Called to report she definitely does not want a CXR.

## 2016-11-11 NOTE — Telephone Encounter (Signed)
Pt. Given CBC,CMP results.Pt. Not interested in CXR unless it is necessary.   ----- Message from Alba CoryKrichna Sowles, MD sent at 11/08/2016  8:30 PM EDT ----- Normal CBC Normal glucose , kidney and liver function test Sodium is still slightly low, it is recommended to have CXR and order some other labs, please ask if she is interested.

## 2016-11-11 NOTE — Telephone Encounter (Signed)
Pt. Called back to report she definitely does not want a CXR.

## 2016-11-11 NOTE — Telephone Encounter (Signed)
Copied from CRM [id number]. Topic: Quick Communication - See Telephone Encounter >> Nov 11, 2016  8:02 AM [name], [name] wrote: CRM for notification. See Telephone encounter for: 11/11/16. Patient called to know what her lab results are.

## 2016-11-18 ENCOUNTER — Telehealth: Payer: Self-pay | Admitting: Family Medicine

## 2016-11-18 NOTE — Telephone Encounter (Signed)
Called patient with lab results no answer.

## 2016-11-18 NOTE — Telephone Encounter (Signed)
Copied from CRM [id number]. Topic: Quick Communication - See Telephone Encounter >> Nov 18, 2016  8:38 AM Diana EvesHoyt, Maryann B wrote: CRM for notification. See Telephone encounter for:  11/18/16. Pt got a call yesterday and didn't know what it was about. Pt wanting dr Carlynn Purlsowles nurse to call her back

## 2017-05-16 ENCOUNTER — Encounter: Payer: Self-pay | Admitting: Family Medicine

## 2017-05-16 ENCOUNTER — Ambulatory Visit (INDEPENDENT_AMBULATORY_CARE_PROVIDER_SITE_OTHER): Payer: Medicare PPO | Admitting: Family Medicine

## 2017-05-16 VITALS — BP 116/64 | HR 105 | Temp 98.3°F | Resp 16 | Ht 61.0 in | Wt 144.8 lb

## 2017-05-16 DIAGNOSIS — E785 Hyperlipidemia, unspecified: Secondary | ICD-10-CM

## 2017-05-16 DIAGNOSIS — E871 Hypo-osmolality and hyponatremia: Secondary | ICD-10-CM

## 2017-05-16 DIAGNOSIS — D692 Other nonthrombocytopenic purpura: Secondary | ICD-10-CM

## 2017-05-16 DIAGNOSIS — I10 Essential (primary) hypertension: Secondary | ICD-10-CM

## 2017-05-16 MED ORDER — LISINOPRIL 10 MG PO TABS: 10.0000 mg | ORAL_TABLET | Freq: Every day | ORAL | 1 refills | Status: DC
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
## 2017-05-17 ENCOUNTER — Telehealth: Payer: Self-pay | Admitting: Family Medicine

## 2017-05-17 NOTE — Telephone Encounter (Signed)
Copied from CRM [id number]. Topic: Quick Communication - See Telephone Encounter >> May 17, 2017 10:26 AM Lorrine Kin, NT wrote: CRM for notification. See Telephone encounter for: 05/17/17. Patient states that her blood pressure is already too low(116/64). States she has 2 months left of the lisinopril . She states she does not think she needs to take the rest of the lisinopril . She would like a call to discuss discontinuing the medication and start taking the medication that is at the pharmacy(Lisinopril ). CB#: [phone]

## 2017-05-17 NOTE — Telephone Encounter (Signed)
Patient notified

## 2017-05-17 NOTE — Telephone Encounter (Signed)
Needs to take lisinopril 10 mg before stopping

## 2017-07-25 ENCOUNTER — Other Ambulatory Visit: Payer: Self-pay | Admitting: Family Medicine

## 2017-07-25 DIAGNOSIS — I10 Essential (primary) hypertension: Secondary | ICD-10-CM

## 2017-07-25 MED ORDER — LISINOPRIL 10 MG PO TABS: 10.0000 mg | ORAL_TABLET | Freq: Every day | ORAL | 1 refills | Status: DC

## 2017-07-25 NOTE — Telephone Encounter (Signed)
Copied from CRM [id number]. Topic: Quick Communication - See Telephone Encounter >> Jul 25, 2017  9:19 AM Windy KalataMichael, Rosela Supak L, NT wrote: CRM for notification. See Telephone encounter for: 07/25/17.  Patient is calling and requesting a refill on lisinopril (PRINIVIL,ZESTRIL) 10 MG tablet. Please advise.  [facility] [address] - , [place] - [address] [place] [id number] Phone: [phone] Fax: [phone]

## 2017-11-14 ENCOUNTER — Ambulatory Visit (INDEPENDENT_AMBULATORY_CARE_PROVIDER_SITE_OTHER): Payer: Medicare PPO | Admitting: Family Medicine

## 2017-11-14 ENCOUNTER — Ambulatory Visit: Payer: Self-pay | Admitting: Family Medicine

## 2017-11-14 ENCOUNTER — Ambulatory Visit: Payer: Self-pay

## 2017-11-14 ENCOUNTER — Encounter: Payer: Self-pay | Admitting: Family Medicine

## 2017-11-14 VITALS — BP 120/62 | HR 73 | Temp 97.9°F | Resp 14 | Ht 61.0 in | Wt 141.7 lb

## 2017-11-14 DIAGNOSIS — Z0000 Encounter for general adult medical examination without abnormal findings: Secondary | ICD-10-CM | POA: Diagnosis not present

## 2017-11-14 DIAGNOSIS — D692 Other nonthrombocytopenic purpura: Secondary | ICD-10-CM | POA: Diagnosis not present

## 2017-11-14 DIAGNOSIS — I10 Essential (primary) hypertension: Secondary | ICD-10-CM

## 2017-11-14 DIAGNOSIS — E871 Hypo-osmolality and hyponatremia: Secondary | ICD-10-CM | POA: Diagnosis not present

## 2017-11-14 DIAGNOSIS — E785 Hyperlipidemia, unspecified: Secondary | ICD-10-CM

## 2017-11-14 MED ORDER — LISINOPRIL 10 MG PO TABS: 10.0000 mg | ORAL_TABLET | Freq: Every day | ORAL | 1 refills | Status: DC

## 2017-11-14 NOTE — Patient Instructions (Signed)
Preventive Care 79 Years and Older, Female Preventive care refers to lifestyle choices and visits with your health care provider that can promote health and wellness. What does preventive care include?  A yearly physical exam. This is also called an annual well check.  Dental exams once or twice a year.  Routine eye exams. Ask your health care provider how often you should have your eyes checked.  Personal lifestyle choices, including: ? Daily care of your teeth and gums. ? Regular physical activity. ? Eating a healthy diet. ? Avoiding tobacco and drug use. ? Limiting alcohol use. ? Practicing safe sex. ? Taking low-dose aspirin every day. ? Taking vitamin and mineral supplements as recommended by your health care provider. What happens during an annual well check? The services and screenings done by your health care provider during your annual well check will depend on your age, overall health, lifestyle risk factors, and family history of disease. Counseling Your health care provider may ask you questions about your:  Alcohol use.  Tobacco use.  Drug use.  Emotional well-being.  Home and relationship well-being.  Sexual activity.  Eating habits.  History of falls.  Memory and ability to understand (cognition).  Work and work environment.  Reproductive health.  Screening You may have the following tests or measurements:  Height, weight, and BMI.  Blood pressure.  Lipid and cholesterol levels. These may be checked every 5 years, or more frequently if you are over 50 years old.  Skin check.  Lung cancer screening. You may have this screening every year starting at age 55 if you have a 30-pack-year history of smoking and currently smoke or have quit within the past 15 years.  Fecal occult blood test (FOBT) of the stool. You may have this test every year starting at age 50.  Flexible sigmoidoscopy or colonoscopy. You may have a sigmoidoscopy every 5 years or  a colonoscopy every 10 years starting at age 50.  Hepatitis C blood test.  Hepatitis B blood test.  Sexually transmitted disease (STD) testing.  Diabetes screening. This is done by checking your blood sugar (glucose) after you have not eaten for a while (fasting). You may have this done every 1-3 years.  Bone density scan. This is done to screen for osteoporosis. You may have this done starting at age 65.  Mammogram. This may be done every 1-2 years. Talk to your health care provider about how often you should have regular mammograms.  Talk with your health care provider about your test results, treatment options, and if necessary, the need for more tests. Vaccines Your health care provider may recommend certain vaccines, such as:  Influenza vaccine. This is recommended every year.  Tetanus, diphtheria, and acellular pertussis (Tdap, Td) vaccine. You may need a Td booster every 10 years.  Varicella vaccine. You may need this if you have not been vaccinated.  Zoster vaccine. You may need this after age 60.  Measles, mumps, and rubella (MMR) vaccine. You may need at least one dose of MMR if you were born in 1957 or later. You may also need a second dose.  Pneumococcal 13-valent conjugate (PCV13) vaccine. One dose is recommended after age 65.  Pneumococcal polysaccharide (PPSV23) vaccine. One dose is recommended after age 65.  Meningococcal vaccine. You may need this if you have certain conditions.  Hepatitis A vaccine. You may need this if you have certain conditions or if you travel or work in places where you may be exposed to hepatitis   A.  Hepatitis B vaccine. You may need this if you have certain conditions or if you travel or work in places where you may be exposed to hepatitis B.  Haemophilus influenzae type b (Hib) vaccine. You may need this if you have certain conditions.  Talk to your health care provider about which screenings and vaccines you need and how often you  need them. This information is not intended to replace advice given to you by your health care provider. Make sure you discuss any questions you have with your health care provider. Document Released: 01/30/2015 Document Revised: 09/23/2015 Document Reviewed: 11/04/2014 Elsevier Interactive Patient Education  2018 Elsevier Inc.  

## 2017-11-14 NOTE — Progress Notes (Signed)
Patient: Sandra [name], Female    DOB: 1940/05/11, 77 y.o.   MRN: [id number]  Visit Date: 11/14/2017  Today's Provider: Ruel Favors, MD   Chief Complaint  Patient presents with  . Medicare Wellness    Subjective:    HPI Sandra [name] is a 77 y.o. female who presents today for her Subsequent Annual Wellness Visit.  Patient/Caregiver input:  And follow up  Senile purpura: stable on her arms, stable  HTN: she is taking mediation as prescribed, bp is at goal, denies any dizziness, chest pain or palpitation at this time  Dyslipidemia: not interested in medications or having labs done, discussed healthy diet. Unchanged   Hyponatremia : last level was done one year ago. She states that she drinks 32 ounces of water daily. No headaches , no longer having dizziness  Review of Systems  Constitutional: Negative for fever or weight change.  Respiratory: Negative for cough and shortness of breath.   Cardiovascular: Negative for chest pain or palpitations.  Gastrointestinal: Negative for abdominal pain, no bowel changes.  Musculoskeletal: Negative for gait problem or joint swelling.  Skin: Negative for rash.  Neurological: Negative for dizziness or headache.  No other specific complaints in a complete review of systems (except as listed in HPI above).  Past Medical History:  Diagnosis Date  . Anxiety   . Bell's palsy   . Changes in skin texture   . Hair loss   . Hiatal hernia   . Hypertension   . Murmur, cardiac   . Nocturia   . Ovarian failure     Past Surgical History:  Procedure Laterality Date  . ABDOMINAL HYSTERECTOMY    . MULTIPLE TOOTH EXTRACTIONS N/A    patient had all of her bottom teeth pulled about 2-3 weeks ago.  . TONSILLECTOMY      Family History  Problem Relation Age of Onset  . Hypertension Daughter   . Hypertension Sister   . Hypertension Brother     Social History   Socioeconomic History  . Marital status: Widowed    Spouse  name: Not on file  . Number of children: 2  . Years of education: 72  . Highest education level: High school graduate  Occupational History  . Not on file  Social Needs  . Financial resource strain: Not hard at all  . Food insecurity:    Worry: Never true    Inability: Never true  . Transportation needs:    Medical: No    Non-medical: No  Tobacco Use  . Smoking status: Never Smoker  . Smokeless tobacco: Never Used  Substance and Sexual Activity  . Alcohol use: No    Alcohol/week: 0.0 standard drinks  . Drug use: No  . Sexual activity: Not Currently  Lifestyle  . Physical activity:    Days per week: 3 days    Minutes per session: 60 min  . Stress: Not at all  Relationships  . Social connections:    Talks on phone: More than three times a week    Gets together: Twice a week    Attends religious service: More than 4 times per year    Active member of club or organization: No    Attends meetings of clubs or organizations: Never    Relationship status: Widowed  . Intimate partner violence:    Fear of current or ex partner: No    Emotionally abused: No    Physically abused: No    Forced sexual activity:  No  Other Topics Concern  . 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     Outpatient Encounter Medications as of 11/14/2017  Medication Sig  . aspirin 81 MG tablet Take 81 mg by mouth daily.  Marland Kitchen lisinopril (PRINIVIL,ZESTRIL) 10 MG tablet Take 1 tablet (10 mg total) by mouth daily.  . Multiple Vitamin (MULTIVITAMIN) tablet Take 1 tablet by mouth daily.   No facility-administered encounter medications on file as of 11/14/2017.     No Known Allergies  Care Team Updated in EHR: Yes  Last Vision Exam: patient has not been lately Wears corrective lenses: Yes Last Dental Exam: around 2-3 weeks ago Last Hearing Exam:  patient stated that it has been a while Wears Hearing Aids: No  Functional Ability / Safety Screening 1.  Was the timed Get Up and Go test shorter than 30 seconds?  yes 2.  Does the patient need help with the phone, transportation, shopping,      preparing meals, housework, laundry, medications, or managing money?  no 3.  Is the patient's home free of loose throw rugs in walkways, pet beds, electrical cords, etc?   yes      Grab bars in the bathroom? yes      Handrails on the stairs?   Yes, but patient has an elevator in the home so she does not have to use the stairs much      Adequate lighting?   yes 4.  Has the patient noticed any hearing difficulties?   no  Diet Recall and Exercise Regimen: patient has scheduled exercises at the retirement home.  Advanced Care Planning: A voluntary discussion about advance care planning including the explanation and discussion of advance directives.  Discussed health care proxy and Living will, and the patient was able to identify a health care proxy as her children .  Patient does not have a living will at present time. If patient does have living will, I have requested they bring this to the clinic to be scanned in to their chart. Does patient have a HCPOA?    no patient stated that she does not approve of any of that because her children will take care of all of that. If yes, name and contact information:  Does patient have a living will or MOST form?  no  Cancer Screenings: Skin: discussed atypical lesions  Lung: no Low Dose CT Chest recommended if Age 63-80 years, 30 pack-year currently smoking OR have quit w/in 15years. Patient does not qualify. Breast: no Up to date on Mammogram? No  Up to date of Bone Density/Dexa? No  Colon: no - refused  Additional Screenings:  Hepatitis B/HIV/Syphillis: N/A Hepatitis C Screening:  Intimate Partner Violence:   Objective:   Vitals: BP 120/62 (BP Location: Left Arm, Patient Position: Sitting, Cuff Size:  Normal)   Pulse 73   Temp 97.9 F (36.6 C)   Resp 14   Ht 5\' 1"  (1.549 m)   Wt 141 lb 11.2 oz (64.3 kg)   SpO2 95%   BMI 26.77 kg/m  Body mass index is 26.77 kg/m.  No exam data present  Physical Exam Constitutional: Patient appears well-developed and well-nourished. Overweight. No distress.  HEENT: head atraumatic, normocephalic, pupils equal and reactive to light,  neck supple, throat within normal  limits Cardiovascular: Normal rate, regular rhythm and normal heart sounds.  No murmur heard. No BLE edema. Pulmonary/Chest: Effort normal and breath sounds normal. No respiratory distress. Abdominal: Soft.  There is no tenderness. Psychiatric: Patient has a normal mood and affect. behavior is normal. Judgment and thought content normal.  Cognitive Testing - 6-CIT  Correct? Score   What year is it? yes 0 Yes = 0    No = 4  What month is it? yes 0 Yes = 0    No = 3  Remember:     [place] [address]ins[address] [place], [place]     What time is it? yes 0 Yes = 0    No = 3  Count backwards from 20 to 1 yes 0 Correct = 0    1 error = 2   More than 1 error = 4  Say the months of the year in reverse. yes 0 Correct = 0    1 error = 2   More than 1 error = 4  What address did I ask you to remember? yes 0 Correct = 0  1 error = 2    2 error = 4    3 error = 6    4 error = 8    All wrong = 10       TOTAL SCORE  0/28   Interpretation:  Normal  Normal (0-7) Abnormal (8-28)   Fall Risk: Fall Risk  11/14/2017 05/16/2017 11/08/2016 05/02/2016 11/02/2015  Falls in the past year? No No No No No    Depression Screen Depression screen [facility] 2/9 11/14/2017 11/14/2017 05/16/2017 11/08/2016 05/02/2016  Decreased Interest 0 0 0 0 0  Down, Depressed, Hopeless 0 0 0 0 0  PHQ - 2 Score 0 0 0 0 0  Altered sleeping 0 - - - -  Tired, decreased energy 0 - - - -  Change in appetite 0 - - - -  Feeling bad or failure about yourself  0 - - - -  Trouble concentrating 0 - - - -  Moving slowly or fidgety/restless 0 - - -  -  Suicidal thoughts 0 - - - -  PHQ-9 Score 0 - - - -  Difficult doing work/chores Not difficult at all - - - -   GAD 7 : Generalized Anxiety Score 11/14/2017  Nervous, Anxious, on Edge 0  Control/stop worrying 0  Worry too much - different things 0  Trouble relaxing 0  Restless 0  Easily annoyed or irritable 0  Afraid - awful might happen 0  Total GAD 7 Score 0  Anxiety Difficulty Not difficult at all     No results found for this or any previous visit (from the past 2160 hour(s)).  Assessment & Plan:    1. Medicare annual wellness visit, subsequent   2. Hypertension, benign  - COMPLETE METABOLIC PANEL WITH GFR  3. Senile purpura (HCC)  stable  4. Dyslipidemia  - Lipid panel  5. Hyponatremia    There are no diagnoses linked to this encounter.   Exercise Activities and Dietary recommendations  Goals: she is going to [facility] for the winter and will be active with her sister    - Discussed health benefits of physical activity, and encouraged her to engage in regular exercise appropriate for her age and condition.   Immunization History  Administered Date(s) Administered  . Pneumococcal Conjugate-13 10/27/2014  . Pneumococcal Polysaccharide-23 11/22/2010    Health Maintenance  Topic  Date Due  . INFLUENZA VACCINE  04/17/2018 (Originally 08/17/2017)  . TETANUS/TDAP  11/15/2018 (Originally 04/18/1957)  . DEXA SCAN  01/02/2019 (Originally 04/19/2003)  . PNA vac Low Risk Adult  Completed    No orders of the defined types were placed in this encounter.   Current Outpatient Medications:  .  aspirin 81 MG tablet, Take 81 mg by mouth daily., Disp: , Rfl:  .  lisinopril (PRINIVIL,ZESTRIL) 10 MG tablet, Take 1 tablet (10 mg total) by mouth daily., Disp: 90 tablet, Rfl: 1 .  Multiple Vitamin (MULTIVITAMIN) tablet, Take 1 tablet by mouth daily., Disp: , Rfl:  There are no discontinued medications.  I have personally reviewed and addressed the Medicare Annual Wellness  health risk assessment questionnaire and have noted the following in the patient's chart:  A.         Medical and social history & family history B.         Use of alcohol, tobacco, and illicit drugs  C.         Current medications and supplements D.         Functional and Cognitive ability and status E.         Nutritional status F.         Physical activity G.        Advance directives H.         List of other physicians I.          Hospitalizations, surgeries, and ER visits in previous 12 months J.         Vitals K.         Screenings such as hearing, vision, cognitive function, and depression L.         Referrals and appointments: none- refuses   In addition, I have reviewed and discussed with patient certain preventive protocols, quality metrics, and best practice recommendations. A written personalized care plan for preventive services as well as general preventive health recommendations were provided to patient.   See attached scanned questionnaire for additional information.

## 2017-11-15 LAB — COMPLETE METABOLIC PANEL WITH GFR
AG Ratio: 1.4 (calc) (ref 1.0–2.5)
ALKALINE PHOSPHATASE (APISO): 86 U/L (ref 33–130)
ALT: 16 U/L (ref 6–29)
AST: 21 U/L (ref 10–35)
Albumin: 4.2 g/dL (ref 3.6–5.1)
BUN: 10 mg/dL (ref 7–25)
CHLORIDE: 96 mmol/L — AB (ref 98–110)
CO2: 23 mmol/L (ref 20–32)
CREATININE: 0.82 mg/dL (ref 0.60–0.93)
Calcium: 9.3 mg/dL (ref 8.6–10.4)
GFR, Est African American: 79 mL/min/{1.73_m2} (ref >=60)
GFR, Est Non African American: 68 mL/min/{1.73_m2} (ref >=60)
GLUCOSE: 87 mg/dL (ref 65–139)
Globulin: 2.9 g/dL (calc) (ref 1.9–3.7)
Potassium: 4.8 mmol/L (ref 3.5–5.3)
Sodium: 130 mmol/L — ABNORMAL LOW (ref 135–146)
Total Bilirubin: 0.5 mg/dL (ref 0.2–1.2)
Total Protein: 7.1 g/dL (ref 6.1–8.1)

## 2017-11-15 LAB — LIPID PANEL
CHOL/HDL RATIO: 3 (calc) (ref <5.0)
Cholesterol: 189 mg/dL (ref <200)
HDL: 64 mg/dL (ref >50)
LDL CHOLESTEROL (CALC): 109 mg/dL — AB
NON-HDL CHOLESTEROL (CALC): 125 mg/dL (ref <130)
TRIGLYCERIDES: 73 mg/dL (ref <150)

## 2017-11-16 ENCOUNTER — Ambulatory Visit: Payer: Self-pay

## 2017-11-16 NOTE — Telephone Encounter (Signed)
Pt called wanting to know what the normal sodium level should be. Triage reviewed last lab level with patient and recited the normal value range for sodium. Pt was again told to decrease the amount of water she is drinking per lab note of Dr Carlynn Purl on 11/15/17. Pt is leaving for [facility] Monday. Reason for Disposition . General information question, no triage required and triager able to answer question  Answer Assessment - Initial Assessment Questions 1. REASON FOR CALL or QUESTION: "What is your reason for calling today?" or "How can I best help you?" or "What question do you have that I can help answer?"     What is normal Na level  Protocols used: INFORMATION ONLY CALL-A-AH

## 2018-05-14 ENCOUNTER — Encounter: Payer: Self-pay | Admitting: Family Medicine

## 2018-05-14 ENCOUNTER — Ambulatory Visit (INDEPENDENT_AMBULATORY_CARE_PROVIDER_SITE_OTHER): Payer: Medicare PPO | Admitting: Family Medicine

## 2018-05-14 ENCOUNTER — Other Ambulatory Visit: Payer: Self-pay

## 2018-05-14 VITALS — BP 120/76

## 2018-05-14 DIAGNOSIS — E785 Hyperlipidemia, unspecified: Secondary | ICD-10-CM

## 2018-05-14 DIAGNOSIS — D692 Other nonthrombocytopenic purpura: Secondary | ICD-10-CM

## 2018-05-14 DIAGNOSIS — E871 Hypo-osmolality and hyponatremia: Secondary | ICD-10-CM | POA: Diagnosis not present

## 2018-05-14 DIAGNOSIS — I10 Essential (primary) hypertension: Secondary | ICD-10-CM

## 2018-05-14 MED ORDER — LISINOPRIL 10 MG PO TABS: 10.0000 mg | ORAL_TABLET | Freq: Every day | ORAL | 2 refills | Status: DC

## 2018-05-14 NOTE — Progress Notes (Signed)
Name: Sandra [name]   MRN: [id number]    DOB: 20-Jun-1940   Date:05/14/2018       Progress Note  Subjective  Chief Complaint  Chief Complaint  Patient presents with  . Hypertension    medication refill    I connected with  [place] P Schriner on 05/14/18 at  9:20 AM EDT by telephone and verified that I am speaking with the correct person using two identifiers.  I discussed the limitations, risks, security and privacy concerns of performing an evaluation and management service by telephone and the availability of in person appointments. Staff also discussed with the patient that there may be a patient responsible charge related to this service. Patient Location: Home Provider Location: Home Additional Individuals present: None  HPI  Senile purpura: Used to have on her arms, patient reports no spots visible at this time.  HTN: she is taking mediation as prescribed (lisinopril 10mg ), bp was at goal at last visit, denies any dizziness, chest pain, shortness of breath, BLE edema, or palpitation at this time.  She is able to check at home and has been getting 120/76.  Dyslipidemia: not interested in medications or having labs done, discussed healthy diet.  Take 81mg  ASA every other day. Stable, unchanged.   Hyponatremia : last level was done 6 months ago and was 130.  This has been ongoing for 2 years and has remained stable.  Recommend recheck at follow up visit in 6 months.  She states that she drinks 32 ounces of water daily. No headaches , no longer having dizziness, no muscle tremor or irritability.  Patient Active Problem List   Diagnosis Date Noted  . Hiatal hernia 10/27/2014  . Hypertension, benign 10/27/2014  . Female pattern hair loss 10/27/2014  . Nocturia 10/27/2014  . Left-sided Bell's palsy 10/27/2014  . Generalized anxiety disorder 10/27/2014  . Senile purpura (HCC) 10/27/2014    Past Surgical History:  Procedure Laterality Date  . ABDOMINAL HYSTERECTOMY     . MULTIPLE TOOTH EXTRACTIONS N/A    patient had all of her bottom teeth pulled about 2-3 weeks ago.  . TONSILLECTOMY      Family History  Problem Relation Age of Onset  . Hypertension Daughter   . Hypertension Sister   . Hypertension Brother     Social History   Socioeconomic History  . Marital status: Widowed    Spouse name: Not on file  . Number of children: 2  . Years of education: 50  . Highest education level: High school graduate  Occupational History  . Not on file  Social Needs  . Financial resource strain: Not hard at all  . Food insecurity:    Worry: Never true    Inability: Never true  . Transportation needs:    Medical: No    Non-medical: No  Tobacco Use  . Smoking status: Never Smoker  . Smokeless tobacco: Never Used  Substance and Sexual Activity  . Alcohol use: No    Alcohol/week: 0.0 standard drinks  . Drug use: No  . Sexual activity: Not Currently  Lifestyle  . Physical activity:    Days per week: 3 days    Minutes per session: 60 min  . Stress: Not at all  Relationships  . Social connections:    Talks on phone: More than three times a week    Gets together: Twice a week    Attends religious service: More than 4 times per year    Active member  of club or organization: No    Attends meetings of clubs or organizations: Never    Relationship status: Widowed  . Intimate partner violence:    Fear of current or ex partner: No    Emotionally abused: No    Physically abused: No    Forced sexual activity: No  Other Topics Concern  . 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      Current Outpatient Medications:  .  aspirin 81 MG tablet, Take 81 mg by mouth daily., Disp: , Rfl:  .  lisinopril (PRINIVIL,ZESTRIL) 10 MG tablet, Take 1 tablet (10 mg total) by mouth daily., Disp: 90 tablet, Rfl:  1 .  Multiple Vitamin (MULTIVITAMIN) tablet, Take 1 tablet by mouth daily., Disp: , Rfl:   No Known Allergies  I personally reviewed active problem list, medication list, allergies, health maintenance, notes from last encounter, lab results with the patient/caregiver today.   ROS  Constitutional: Negative for fever or weight change.  Respiratory: Negative for cough and shortness of breath.   Cardiovascular: Negative for chest pain or palpitations.  Gastrointestinal: Negative for abdominal pain, no bowel changes.  Musculoskeletal: Negative for gait problem or joint swelling.  Skin: Negative for rash.  Neurological: Negative for dizziness or headache.  No other specific complaints in a complete review of systems (except as listed in HPI above).  Objective  Virtual encounter, vitals not obtained.  There is no height or weight on file to calculate BMI.  Physical Exam  Pulmonary/Chest: Effort normal. No respiratory distress. Speaking in complete sentences Neurological: Pt is alert and oriented to person, place, and time. Speech is normal..  Psychiatric: Patient has a normal mood and affect. behavior is normal. Judgment and thought content normal.  No results found for this or any previous visit (from the past 72 hour(s)).  PHQ2/9: Depression screen [facility] 2/9 05/14/2018 11/14/2017 11/14/2017 05/16/2017 11/08/2016  Decreased Interest 0 0 0 0 0  Down, Depressed, Hopeless 0 0 0 0 0  PHQ - 2 Score 0 0 0 0 0  Altered sleeping 0 0 - - -  Tired, decreased energy 0 0 - - -  Change in appetite 0 0 - - -  Feeling bad or failure about yourself  0 0 - - -  Trouble concentrating 0 0 - - -  Moving slowly or fidgety/restless 0 0 - - -  Suicidal thoughts 0 0 - - -  PHQ-9 Score 0 0 - - -  Difficult doing work/chores Not difficult at all Not difficult at all - - -   PHQ-2/9 Result is negative.    Fall Risk: Fall Risk  05/14/2018 11/14/2017 05/16/2017 11/08/2016 05/02/2016  Falls in the past  year? 0 No No No No  Number falls in past yr: 0 - - - -  Injury with Fall? 0 - - - -  Follow up Falls evaluation completed - - - -    Assessment & Plan  1. Hypertension, benign - DASH diet discussed - lisinopril (ZESTRIL) 10 MG tablet; Take 1 tablet (10 mg total) by mouth daily.  Dispense: 90 tablet; Refill: 2  2. Senile purpura (HCC) - Unable to visually assess, however she reports there are not visible areas at this time. May continue ASA therapy  3. Dyslipidemia - Does  not want medication at this time, and at her advanced age, we will avoid initiation of statin.  Advised may continue with 81mg  ASA daily or every other day per her preference.  4. Hyponatremia - She is being monitored annually, and is not due at this time for laboratory testing.  Stable without symptoms.  I discussed the assessment and treatment plan with the patient. The patient was provided an opportunity to ask questions and all were answered. The patient agreed with the plan and demonstrated an understanding of the instructions.   The patient was advised to call back or seek an in-person evaluation if the symptoms worsen or if the condition fails to improve as anticipated.  I provided 17 minutes of non-face-to-face time during this encounter.  Doren CustardEmily E Boyce, FNP

## 2018-05-22 ENCOUNTER — Ambulatory Visit: Payer: Self-pay | Admitting: Family Medicine

## 2018-08-19 ENCOUNTER — Inpatient Hospital Stay
Admission: EM | Admit: 2018-08-19 | Discharge: 2018-08-24 | DRG: 481 | Disposition: A | Discharge status: Skilled Nursing Facility | Payer: Medicare PPO | Source: Skilled Nursing Facility | Attending: Internal Medicine | Admitting: Internal Medicine

## 2018-08-19 ENCOUNTER — Emergency Department: Payer: Medicare PPO

## 2018-08-19 ENCOUNTER — Other Ambulatory Visit: Payer: Self-pay

## 2018-08-19 ENCOUNTER — Encounter
Admission: EM | Disposition: A | Discharge status: Skilled Nursing Facility | Payer: Self-pay | Source: Skilled Nursing Facility | Attending: Internal Medicine

## 2018-08-19 ENCOUNTER — Inpatient Hospital Stay: Payer: Medicare PPO

## 2018-08-19 ENCOUNTER — Inpatient Hospital Stay: Payer: Medicare PPO | Admitting: Anesthesiology

## 2018-08-19 ENCOUNTER — Encounter: Payer: Self-pay | Admitting: Emergency Medicine

## 2018-08-19 DIAGNOSIS — F411 Generalized anxiety disorder: Secondary | ICD-10-CM | POA: Diagnosis not present

## 2018-08-19 DIAGNOSIS — S72141A Displaced intertrochanteric fracture of right femur, initial encounter for closed fracture: Secondary | ICD-10-CM | POA: Diagnosis not present

## 2018-08-19 DIAGNOSIS — S72001A Fracture of unspecified part of neck of right femur, initial encounter for closed fracture: Secondary | ICD-10-CM | POA: Diagnosis not present

## 2018-08-19 DIAGNOSIS — S299XXA Unspecified injury of thorax, initial encounter: Secondary | ICD-10-CM | POA: Diagnosis not present

## 2018-08-19 DIAGNOSIS — W010XXA Fall on same level from slipping, tripping and stumbling without subsequent striking against object, initial encounter: Secondary | ICD-10-CM | POA: Diagnosis present

## 2018-08-19 DIAGNOSIS — Z20828 Contact with and (suspected) exposure to other viral communicable diseases: Secondary | ICD-10-CM | POA: Diagnosis not present

## 2018-08-19 DIAGNOSIS — S72141D Displaced intertrochanteric fracture of right femur, subsequent encounter for closed fracture with routine healing: Secondary | ICD-10-CM | POA: Diagnosis not present

## 2018-08-19 DIAGNOSIS — K5900 Constipation, unspecified: Secondary | ICD-10-CM | POA: Diagnosis present

## 2018-08-19 DIAGNOSIS — R52 Pain, unspecified: Secondary | ICD-10-CM | POA: Diagnosis not present

## 2018-08-19 DIAGNOSIS — Z79899 Other long term (current) drug therapy: Secondary | ICD-10-CM

## 2018-08-19 DIAGNOSIS — Z7982 Long term (current) use of aspirin: Secondary | ICD-10-CM

## 2018-08-19 DIAGNOSIS — Z8249 Family history of ischemic heart disease and other diseases of the circulatory system: Secondary | ICD-10-CM

## 2018-08-19 DIAGNOSIS — S7291XD Unspecified fracture of right femur, subsequent encounter for closed fracture with routine healing: Secondary | ICD-10-CM | POA: Diagnosis not present

## 2018-08-19 DIAGNOSIS — K08109 Complete loss of teeth, unspecified cause, unspecified class: Secondary | ICD-10-CM | POA: Diagnosis present

## 2018-08-19 DIAGNOSIS — W19XXXD Unspecified fall, subsequent encounter: Secondary | ICD-10-CM | POA: Diagnosis not present

## 2018-08-19 DIAGNOSIS — W19XXXA Unspecified fall, initial encounter: Secondary | ICD-10-CM | POA: Diagnosis not present

## 2018-08-19 DIAGNOSIS — Y92129 Unspecified place in nursing home as the place of occurrence of the external cause: Secondary | ICD-10-CM

## 2018-08-19 DIAGNOSIS — F419 Anxiety disorder, unspecified: Secondary | ICD-10-CM | POA: Diagnosis present

## 2018-08-19 DIAGNOSIS — D72829 Elevated white blood cell count, unspecified: Secondary | ICD-10-CM

## 2018-08-19 DIAGNOSIS — M2550 Pain in unspecified joint: Secondary | ICD-10-CM | POA: Diagnosis not present

## 2018-08-19 DIAGNOSIS — I959 Hypotension, unspecified: Secondary | ICD-10-CM | POA: Diagnosis not present

## 2018-08-19 DIAGNOSIS — Z03818 Encounter for observation for suspected exposure to other biological agents ruled out: Secondary | ICD-10-CM | POA: Diagnosis not present

## 2018-08-19 DIAGNOSIS — D62 Acute posthemorrhagic anemia: Secondary | ICD-10-CM | POA: Diagnosis not present

## 2018-08-19 DIAGNOSIS — E871 Hypo-osmolality and hyponatremia: Secondary | ICD-10-CM | POA: Diagnosis present

## 2018-08-19 DIAGNOSIS — Z1159 Encounter for screening for other viral diseases: Secondary | ICD-10-CM | POA: Diagnosis not present

## 2018-08-19 DIAGNOSIS — Z90710 Acquired absence of both cervix and uterus: Secondary | ICD-10-CM | POA: Diagnosis not present

## 2018-08-19 DIAGNOSIS — S72009A Fracture of unspecified part of neck of unspecified femur, initial encounter for closed fracture: Secondary | ICD-10-CM

## 2018-08-19 DIAGNOSIS — S72001D Fracture of unspecified part of neck of right femur, subsequent encounter for closed fracture with routine healing: Secondary | ICD-10-CM | POA: Diagnosis not present

## 2018-08-19 DIAGNOSIS — I10 Essential (primary) hypertension: Secondary | ICD-10-CM | POA: Diagnosis not present

## 2018-08-19 DIAGNOSIS — Z7401 Bed confinement status: Secondary | ICD-10-CM | POA: Diagnosis not present

## 2018-08-19 DIAGNOSIS — G510 Bell's palsy: Secondary | ICD-10-CM | POA: Diagnosis not present

## 2018-08-19 HISTORY — PX: INTRAMEDULLARY (IM) NAIL INTERTROCHANTERIC: SHX5875

## 2018-08-19 LAB — CBC WITH DIFFERENTIAL/PLATELET
Abs Immature Granulocytes: 0.16 10*3/uL — ABNORMAL HIGH (ref 0.00–0.07)
Basophils Absolute: 0.1 10*3/uL (ref 0.0–0.1)
Basophils Relative: 0 %
Eosinophils Absolute: 0 10*3/uL (ref 0.0–0.5)
Eosinophils Relative: 0 %
HCT: 34.1 % — ABNORMAL LOW (ref 36.0–46.0)
Hemoglobin: 11.7 g/dL — ABNORMAL LOW (ref 12.0–15.0)
Immature Granulocytes: 1 %
Lymphocytes Relative: 8 %
Lymphs Abs: 1.5 10*3/uL (ref 0.7–4.0)
MCH: 31.4 pg (ref 26.0–34.0)
MCHC: 34.3 g/dL (ref 30.0–36.0)
MCV: 91.4 fL (ref 80.0–100.0)
Monocytes Absolute: 1.6 10*3/uL — ABNORMAL HIGH (ref 0.1–1.0)
Monocytes Relative: 8 %
Neutro Abs: 16 10*3/uL — ABNORMAL HIGH (ref 1.7–7.7)
Neutrophils Relative %: 83 %
Platelets: 224 10*3/uL (ref 150–400)
RBC: 3.73 MIL/uL — ABNORMAL LOW (ref 3.87–5.11)
RDW: 12.3 % (ref 11.5–15.5)
WBC: 19.3 10*3/uL — ABNORMAL HIGH (ref 4.0–10.5)
nRBC: 0 % (ref 0.0–0.2)

## 2018-08-19 LAB — URINALYSIS, ROUTINE W REFLEX MICROSCOPIC
Bilirubin Urine: NEGATIVE
Glucose, UA: NEGATIVE mg/dL
Hgb urine dipstick: NEGATIVE
Ketones, ur: 5 mg/dL — AB
Leukocytes,Ua: NEGATIVE
Nitrite: NEGATIVE
Protein, ur: 30 mg/dL — AB
Specific Gravity, Urine: 1.018 (ref 1.005–1.030)
pH: 5 (ref 5.0–8.0)

## 2018-08-19 LAB — COMPREHENSIVE METABOLIC PANEL
ALT: 23 U/L (ref 0–44)
AST: 32 U/L (ref 15–41)
Albumin: 3.7 g/dL (ref 3.5–5.0)
Alkaline Phosphatase: 68 U/L (ref 38–126)
Anion gap: 7 (ref 5–15)
BUN: 14 mg/dL (ref 8–23)
CO2: 24 mmol/L (ref 22–32)
Calcium: 8.9 mg/dL (ref 8.9–10.3)
Chloride: 101 mmol/L (ref 98–111)
Creatinine, Ser: 0.8 mg/dL (ref 0.44–1.00)
GFR calc Af Amer: >60 mL/min (ref >60)
GFR calc non Af Amer: >60 mL/min (ref >60)
Glucose, Bld: 119 mg/dL — ABNORMAL HIGH (ref 70–99)
Potassium: 4.1 mmol/L (ref 3.5–5.1)
Sodium: 132 mmol/L — ABNORMAL LOW (ref 135–145)
Total Bilirubin: 0.6 mg/dL (ref 0.3–1.2)
Total Protein: 6.9 g/dL (ref 6.5–8.1)

## 2018-08-19 LAB — PROTIME-INR
INR: 1 (ref 0.8–1.2)
Prothrombin Time: 12.8 seconds (ref 11.4–15.2)

## 2018-08-19 LAB — APTT: aPTT: 26 seconds (ref 24–36)

## 2018-08-19 LAB — SARS CORONAVIRUS 2 BY RT PCR (HOSPITAL ORDER, PERFORMED IN ~~LOC~~ HOSPITAL LAB): SARS Coronavirus 2: NEGATIVE

## 2018-08-19 LAB — TSH: TSH: 1.07 u[IU]/mL (ref 0.350–4.500)

## 2018-08-19 LAB — ABO/RH: ABO/RH(D): A POS

## 2018-08-19 LAB — SURGICAL PCR SCREEN
MRSA, PCR: NEGATIVE
Staphylococcus aureus: NEGATIVE

## 2018-08-19 SURGERY — FIXATION, FRACTURE, INTERTROCHANTERIC, WITH INTRAMEDULLARY ROD
Anesthesia: Spinal | Laterality: Right

## 2018-08-19 MED ORDER — KETAMINE HCL 50 MG/ML IJ SOLN
INTRAMUSCULAR | Status: DC | PRN
  Administered 2018-08-19: 25 mg via INTRAVENOUS

## 2018-08-19 MED ORDER — ONDANSETRON HCL 4 MG/2ML IJ SOLN: 4.0000 mg | Freq: Once | INTRAMUSCULAR | Status: DC | PRN

## 2018-08-19 MED ORDER — MORPHINE SULFATE (PF) 4 MG/ML IV SOLN
4.0000 mg | INTRAVENOUS | Status: DC | PRN
  Filled 2018-08-19: qty 1

## 2018-08-19 MED ORDER — ENSURE PRE-SURGERY PO LIQD
296.0000 mL | Freq: Once | ORAL | Status: DC
  Filled 2018-08-19: qty 296

## 2018-08-19 MED ORDER — EPHEDRINE SULFATE 50 MG/ML IJ SOLN
INTRAMUSCULAR | Status: AC
  Filled 2018-08-19: qty 1

## 2018-08-19 MED ORDER — MIDAZOLAM HCL 5 MG/5ML IJ SOLN
INTRAMUSCULAR | Status: DC | PRN
  Administered 2018-08-19: 2 mg via INTRAVENOUS

## 2018-08-19 MED ORDER — SODIUM CHLORIDE 0.9 % IV BOLUS
1000.0000 mL | Freq: Once | INTRAVENOUS | Status: AC
  Administered 2018-08-19: 07:00:00 1000 mL via INTRAVENOUS

## 2018-08-19 MED ORDER — BUPIVACAINE HCL (PF) 0.5 % IJ SOLN
INTRAMUSCULAR | Status: AC
  Filled 2018-08-19: qty 10

## 2018-08-19 MED ORDER — CEFAZOLIN SODIUM-DEXTROSE 2-4 GM/100ML-% IV SOLN
2.0000 g | INTRAVENOUS | Status: AC
  Administered 2018-08-19: 19:00:00 2 g via INTRAVENOUS
  Filled 2018-08-19: qty 100

## 2018-08-19 MED ORDER — BUPIVACAINE HCL (PF) 0.5 % IJ SOLN
INTRAMUSCULAR | Status: DC | PRN
  Administered 2018-08-19: 3 mL via INTRATHECAL

## 2018-08-19 MED ORDER — METOPROLOL TARTRATE 5 MG/5ML IV SOLN
INTRAVENOUS | Status: DC | PRN
  Administered 2018-08-19: 2.5 mg via INTRAVENOUS

## 2018-08-19 MED ORDER — PROPOFOL 500 MG/50ML IV EMUL
INTRAVENOUS | Status: DC | PRN
  Administered 2018-08-19: 25 ug/kg/min via INTRAVENOUS

## 2018-08-19 MED ORDER — PHENYLEPHRINE HCL (PRESSORS) 10 MG/ML IV SOLN
INTRAVENOUS | Status: DC | PRN
  Administered 2018-08-19: 200 ug via INTRAVENOUS
  Administered 2018-08-19: 100 ug via INTRAVENOUS
  Administered 2018-08-19 (×2): 200 ug via INTRAVENOUS
  Administered 2018-08-19: 100 ug via INTRAVENOUS
  Administered 2018-08-19 (×5): 200 ug via INTRAVENOUS
  Administered 2018-08-19: 100 ug via INTRAVENOUS
  Administered 2018-08-19 (×2): 200 ug via INTRAVENOUS
  Administered 2018-08-19: 100 ug via INTRAVENOUS
  Administered 2018-08-19 (×2): 200 ug via INTRAVENOUS
  Administered 2018-08-19: 100 ug via INTRAVENOUS

## 2018-08-19 MED ORDER — ACETAMINOPHEN 650 MG RE SUPP: 650.0000 mg | Freq: Four times a day (QID) | RECTAL | Status: DC | PRN

## 2018-08-19 MED ORDER — SODIUM CHLORIDE FLUSH 0.9 % IV SOLN
INTRAVENOUS | Status: AC
  Filled 2018-08-19: qty 10

## 2018-08-19 MED ORDER — TRANEXAMIC ACID-NACL 1000-0.7 MG/100ML-% IV SOLN
1000.0000 mg | INTRAVENOUS | Status: DC
  Filled 2018-08-19: qty 100

## 2018-08-19 MED ORDER — OXYCODONE-ACETAMINOPHEN 5-325 MG PO TABS
1.0000 | ORAL_TABLET | Freq: Four times a day (QID) | ORAL | Status: DC | PRN
  Administered 2018-08-20 – 2018-08-24 (×6): 1 via ORAL
  Filled 2018-08-19 (×7): qty 1

## 2018-08-19 MED ORDER — LIDOCAINE HCL (PF) 2 % IJ SOLN
INTRAMUSCULAR | Status: DC | PRN
  Administered 2018-08-19: 50 mg

## 2018-08-19 MED ORDER — ONDANSETRON HCL 4 MG/2ML IJ SOLN: 4.0000 mg | Freq: Four times a day (QID) | INTRAMUSCULAR | Status: DC | PRN

## 2018-08-19 MED ORDER — EPHEDRINE SULFATE 50 MG/ML IJ SOLN
INTRAMUSCULAR | Status: DC | PRN
  Administered 2018-08-19 (×2): 10 mg via INTRAVENOUS

## 2018-08-19 MED ORDER — SODIUM CHLORIDE 0.9 % IV SOLN
INTRAVENOUS | Status: DC
  Administered 2018-08-19 – 2018-08-20 (×3): via INTRAVENOUS

## 2018-08-19 MED ORDER — CHLORHEXIDINE GLUCONATE 4 % EX LIQD
60.0000 mL | Freq: Once | CUTANEOUS | Status: AC
  Administered 2018-08-19: 4 via TOPICAL

## 2018-08-19 MED ORDER — MORPHINE SULFATE (PF) 2 MG/ML IV SOLN
2.0000 mg | INTRAVENOUS | Status: DC | PRN
  Administered 2018-08-19: 11:00:00 2 mg via INTRAVENOUS
  Filled 2018-08-19: qty 1

## 2018-08-19 MED ORDER — ACETAMINOPHEN 325 MG PO TABS
650.0000 mg | ORAL_TABLET | Freq: Four times a day (QID) | ORAL | Status: DC | PRN
  Administered 2018-08-20 – 2018-08-23 (×9): 650 mg via ORAL
  Filled 2018-08-19 (×9): qty 2

## 2018-08-19 MED ORDER — KETAMINE HCL 50 MG/ML IJ SOLN
INTRAMUSCULAR | Status: AC
  Filled 2018-08-19: qty 10

## 2018-08-19 MED ORDER — DOCUSATE SODIUM 100 MG PO CAPS
100.0000 mg | ORAL_CAPSULE | Freq: Two times a day (BID) | ORAL | Status: DC
  Administered 2018-08-20 – 2018-08-24 (×9): 100 mg via ORAL
  Filled 2018-08-19 (×10): qty 1

## 2018-08-19 MED ORDER — CEFAZOLIN SODIUM-DEXTROSE 1-4 GM/50ML-% IV SOLN
1.0000 g | Freq: Three times a day (TID) | INTRAVENOUS | Status: AC
  Administered 2018-08-20 (×3): 1 g via INTRAVENOUS
  Filled 2018-08-19 (×4): qty 50

## 2018-08-19 MED ORDER — ONDANSETRON HCL 4 MG PO TABS
4.0000 mg | ORAL_TABLET | Freq: Four times a day (QID) | ORAL | Status: DC | PRN
  Administered 2018-08-22: 16:00:00 4 mg via ORAL
  Filled 2018-08-19: qty 1

## 2018-08-19 MED ORDER — GLYCOPYRROLATE 0.2 MG/ML IJ SOLN
INTRAMUSCULAR | Status: AC
  Filled 2018-08-19: qty 1

## 2018-08-19 MED ORDER — METOPROLOL TARTRATE 5 MG/5ML IV SOLN
INTRAVENOUS | Status: AC
  Filled 2018-08-19: qty 5

## 2018-08-19 MED ORDER — SODIUM CHLORIDE 0.9 % IV SOLN
INTRAVENOUS | Status: DC
  Administered 2018-08-19: 05:00:00 via INTRAVENOUS

## 2018-08-19 MED ORDER — PROPOFOL 10 MG/ML IV BOLUS
INTRAVENOUS | Status: DC | PRN
  Administered 2018-08-19: 20 mg via INTRAVENOUS

## 2018-08-19 MED ORDER — FENTANYL CITRATE (PF) 100 MCG/2ML IJ SOLN: 25.0000 ug | INTRAMUSCULAR | Status: DC | PRN

## 2018-08-19 MED ORDER — BACITRACIN 50000 UNITS IM SOLR
INTRAMUSCULAR | Status: AC
  Filled 2018-08-19: qty 1

## 2018-08-19 MED ORDER — MIDAZOLAM HCL 2 MG/2ML IJ SOLN
INTRAMUSCULAR | Status: AC
  Filled 2018-08-19: qty 2

## 2018-08-19 MED ORDER — ENOXAPARIN SODIUM 40 MG/0.4ML ~~LOC~~ SOLN
40.0000 mg | SUBCUTANEOUS | Status: DC
  Administered 2018-08-20 – 2018-08-24 (×5): 40 mg via SUBCUTANEOUS
  Filled 2018-08-19 (×6): qty 0.4

## 2018-08-19 MED ORDER — GLYCOPYRROLATE 0.2 MG/ML IJ SOLN
INTRAMUSCULAR | Status: DC | PRN
  Administered 2018-08-19: 0.2 mg via INTRAVENOUS

## 2018-08-19 SURGICAL SUPPLY — 34 items
BIT DRILL 4.2 (DRILL) ×1 IMPLANT
CANISTER SUCT 1200ML W/VALVE (MISCELLANEOUS) ×3 IMPLANT
DRAPE 3/4 80X56 (DRAPES) ×3 IMPLANT
DRAPE C-ARMOR (DRAPES) ×3 IMPLANT
DRAPE U-SHAPE 47X51 STRL (DRAPES) IMPLANT
DRILL 4.2 (DRILL) ×3
DRSG DERMACEA 8X12 NADH (GAUZE/BANDAGES/DRESSINGS) ×3 IMPLANT
DRSG OPSITE POSTOP 3X4 (GAUZE/BANDAGES/DRESSINGS) ×3 IMPLANT
DRSG OPSITE POSTOP 4X12 (GAUZE/BANDAGES/DRESSINGS) ×3 IMPLANT
DURAPREP 26ML APPLICATOR (WOUND CARE) ×3 IMPLANT
ELECT REM PT RETURN 9FT ADLT (ELECTROSURGICAL) ×3
ELECTRODE REM PT RTRN 9FT ADLT (ELECTROSURGICAL) ×1 IMPLANT
GLOVE BIOGEL M STRL SZ7.5 (GLOVE) ×3 IMPLANT
GLOVE INDICATOR 8.0 STRL GRN (GLOVE) ×3 IMPLANT
GOWN STRL REUS W/ TWL LRG LVL3 (GOWN DISPOSABLE) ×2 IMPLANT
GOWN STRL REUS W/TWL LRG LVL3 (GOWN DISPOSABLE) ×4
GUIDEWIRE 3.2X400 (WIRE) ×6 IMPLANT
KIT TURNOVER CYSTO (KITS) ×3 IMPLANT
MAT ABSORB  FLUID 56X50 GRAY (MISCELLANEOUS) ×2
MAT ABSORB FLUID 56X50 GRAY (MISCELLANEOUS) ×1 IMPLANT
NAIL TI CAN TFNA 12 130 360 RT (Nail) ×3 IMPLANT
NS IRRIG 1000ML POUR BTL (IV SOLUTION) ×3 IMPLANT
PACK HIP COMPR (MISCELLANEOUS) ×3 IMPLANT
REAMER ROD DEEP FLUTE 2.5X950 (INSTRUMENTS) ×6 IMPLANT
SCREW FENES TFNA 100 ST (Screw) ×3 IMPLANT
SCREW LOCK STAR 5X42 (Screw) ×3 IMPLANT
SOL PREP PVP 2OZ (MISCELLANEOUS) ×3
SOLUTION PREP PVP 2OZ (MISCELLANEOUS) ×1 IMPLANT
STAPLER SKIN PROX 35W (STAPLE) ×3 IMPLANT
SUT MON AB 2-0 CT1 36 (SUTURE) ×3 IMPLANT
SUT VIC AB 0 CT1 36 (SUTURE) ×6 IMPLANT
SUT VIC AB 1 CT1 36 (SUTURE) ×3 IMPLANT
SUT VIC AB 2-0 CT1 27 (SUTURE) ×2
SUT VIC AB 2-0 CT1 TAPERPNT 27 (SUTURE) ×1 IMPLANT

## 2018-08-19 NOTE — Op Note (Signed)
IOPERATIVE NOTE  DATE OF SURGERY:  08/19/2018  PATIENT NAME:  Sandra [name]   DOB: 10/30/1940  MRN: [id number]   PRE-OPERATIVE DIAGNOSIS:  Right pertrochanteric femur fracture  POST-OPERATIVE DIAGNOSIS:  Same  PROCEDURE:  Right pertrochanteric femur fracture closed reduction and long cephalomedullary nail  SURGEON:  Nida Boatman, M.D.   ASSISTANT: none  ANESTHESIA: spinal  ESTIMATED BLOOD LOSS: 300 mL  FLUIDS REPLACED: 800 mL of crystalloid  TOURNIQUET TIME: N/a   DRAINS: n/a  IMPLANTS UTILIZED: Sythes 40m 130 deg TFNA x 3649m Synthes cephalomedullary screw x10064m5.0mm12mstal locking screw x1.  INDICATIONS FOR SURGERY: [place] 78 y67. year old female who has sustained a right pertrochanteric femur fracture After discussion of the risks and benefits of surgical intervention, the patient expressed understanding of the risks benefits and agree with plans for right femur cephalomedullary nail placement   PROCEDURE IN DETAIL: The patient was met in the pre-operative holding area and we re-discussed the risks/benefits/alternatives to surgery. We verified the consent and the surgical site was marked. The patient was brought back to the operating room and spinal anesthesia was provided. The patient was then transferred to the fracture table. A surgical time out was performed with all in the room in agreement.   The patients extremity was manipulated under fluoroscopy to show we could get adequate imaging and that near anatomic fracture reduction was possible. The patient's operative extremity was prepped and draped in standard fashion. A surgical time out was performed with all in the room in agreement.   A 5 cm incision was made proximal to the tip of the trochanter and dissection was made through the gluteal fascia until the greater trochanter was palpated. A starting wire was placed at the tip of the greater trochanter in both AP and lateral planes. An opening  reamer was used only in the proximal segment and a guide wire was passed. Sequential reaming up to the appropriate size was performed only reaming the distal segment. The appropriate sized nail was then placed.   At this point the greater trochanteric split was in abduction and flexion. A key elevator was used through an accessory lateral incision to push the lateral wall into place. The guide pin for the head screw was then advanced into this fragment. Next, a weber tenaculum was used as a bone hook to hook the medial neck and aid in reduction. The wire was then advanced on AP and lateral views until into subchondral bone. The screw length was measure. The drill was advanced to appropriate depth and the screw was placed. Once the screw was fully inserted the compression handle was used to further compress the fracture site.   At this point it was noted that the lateral wall had displaced. The screw was removed, the lateral wall was re-reduced and the lateral wall drill was used to make a path for the screw. The screw was then re-inserted with a good bite.   Attention was then taken to the distal interlocking screw. Perfect circle method was used and a distal interlock was placed under fluoroscopic guidance.   Final fluoroscopy showed adequate reduction and hardware placement.   The wounds were copiously irrigated and fascia closed with 0-vycril, and the skin closed with 2-0 monocryl and staples. The wounds were dressed with xeroform, sterile gauze, and tegaderm  The patient was transferred back to the hospital bed and to the PACU without difficulty.    AFTERCARE: The patient will be  WBAT and see therapy for ambulation. DVT prophylaxis will be mobilization, SCDs, and six weeks of chemoprophylaxis. The patient will need a two week follow up for wound check and staple removal.    Nida Boatman, MD.

## 2018-08-19 NOTE — Transfer of Care (Signed)
Immediate Anesthesia Transfer of Care Note  Patient: [name]  Procedure(s) Performed: INTRAMEDULLARY (IM) NAIL INTERTROCHANTRIC (Right )  Patient Location: PACU  Anesthesia Type:Spinal  Level of Consciousness: awake and alert   Airway & Oxygen Therapy: Patient Spontanous Breathing and Patient connected to face mask oxygen  Post-op Assessment: Report given to RN and Post -op Vital signs reviewed and stable  Post vital signs: Reviewed  Last Vitals:  Vitals Value Taken Time  BP 97/48 08/19/18 2117  Temp    Pulse 130 08/19/18 2118  Resp 16 08/19/18 2119  SpO2 85 % 08/19/18 2118  Vitals shown include unvalidated device data.  Last Pain:  Vitals:   08/19/18 1122  TempSrc:   PainSc: 6          Complications: No apparent anesthesia complications

## 2018-08-19 NOTE — ED Notes (Signed)
Attempt iv insertion without success. Dorthula Rue, rn in to attempt iv initiation.

## 2018-08-19 NOTE — Brief Op Note (Signed)
08/19/2018  9:09 PM  PATIENT:  Sandra [name]  78 y.o. female  PRE-OPERATIVE DIAGNOSIS:  right hip fracture  POST-OPERATIVE DIAGNOSIS:  same  PROCEDURE:  Procedure(s): INTRAMEDULLARY (IM) NAIL INTERTROCHANTRIC (Right)  SURGEON:  Surgeon(s) and Role:    * Kylin Genna, Ted Mcalpine, MD - Primary  PHYSICIAN ASSISTANT:   ASSISTANTS: none   ANESTHESIA:   spinal  EBL:  300 mL   BLOOD ADMINISTERED:none  DRAINS: none   LOCAL MEDICATIONS USED:  NONE  SPECIMEN:  No Specimen  DISPOSITION OF SPECIMEN:  N/A  COUNTS:  YES  TOURNIQUET:  * No tourniquets in log *  DICTATION: .Note written in EPIC  PLAN OF CARE: Admit to inpatient   PATIENT DISPOSITION:  PACU - hemodynamically stable.   Delay start of Pharmacological VTE agent (>24hrs) due to surgical blood loss or risk of bleeding: no
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
## 2018-08-19 NOTE — ED Notes (Signed)
purewick placed for comfort
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
## 2018-08-19 NOTE — Progress Notes (Signed)
[facility] [place] [facility]   PATIENT NAME: Sandra BaconVirginia [name]    MR#:  [id number]  DATE OF BIRTH:  12/05/1940  SUBJECTIVE:  CHIEF COMPLAINT: Patient is resting comfortably reporting hip pain agreeable with the surgery today  REVIEW OF SYSTEMS:  CONSTITUTIONAL: No fever, fatigue or weakness.  EYES: No blurred or double vision.  EARS, NOSE, AND THROAT: No tinnitus or ear pain.  RESPIRATORY: No cough, shortness of breath, wheezing or hemoptysis.  CARDIOVASCULAR: No chest pain, orthopnea, edema.  GASTROINTESTINAL: No nausea, vomiting, diarrhea or abdominal pain.  GENITOURINARY: No dysuria, hematuria.  ENDOCRINE: No polyuria, nocturia,  HEMATOLOGY: No anemia, easy bruising or bleeding SKIN: No rash or lesion. MUSCULOSKELETAL: Right hip pain NEUROLOGIC: No tingling, numbness, weakness.  PSYCHIATRY: No anxiety or depression.   DRUG ALLERGIES:  No Known Allergies  VITALS:  Blood pressure (!) 100/48, pulse (!) 57, temperature 97.8 F (36.6 C), temperature source Oral, resp. rate (!) 22, height 5\' 7"  (1.702 m), weight 65.8 kg, SpO2 100 %.  PHYSICAL EXAMINATION:  GENERAL:  78 y.o.-year-old patient lying in the bed with no acute distress.  EYES: Pupils equal, round, reactive to light and accommodation. No scleral icterus. Extraocular muscles intact.  HEENT: Head atraumatic, normocephalic. Oropharynx and nasopharynx clear.  NECK:  Supple, no jugular venous distention. No thyroid enlargement, no tenderness.  LUNGS: Normal breath sounds bilaterally, no wheezing, rales,rhonchi or crepitation. No use of accessory muscles of respiration.  CARDIOVASCULAR: S1, S2 normal. No murmurs, rubs, or gallops.  ABDOMEN: Soft, nontender, nondistended. Bowel sounds present.  EXTREMITIES: Right hip is tender externally rotated no pedal edema, cyanosis, or clubbing.  NEUROLOGIC: Cranial nerves II through XII are intact. Sensation intact. Gait not checked.  PSYCHIATRIC: The  patient is alert and oriented x 3.  SKIN: No obvious rash, lesion, or ulcer.    LABORATORY PANEL:   CBC Recent Labs  Lab 08/19/18 0506  WBC 19.3*  HGB 11.7*  HCT 34.1*  PLT 224   ------------------------------------------------------------------------------------------------------------------  Chemistries  Recent Labs  Lab 08/19/18 0506  NA 132*  K 4.1  CL 101  CO2 24  GLUCOSE 119*  BUN 14  CREATININE 0.80  CALCIUM 8.9  AST 32  ALT 23  ALKPHOS 68  BILITOT 0.6   ------------------------------------------------------------------------------------------------------------------  Cardiac Enzymes No results for input(s): TROPONINI in the last 168 hours. ------------------------------------------------------------------------------------------------------------------  RADIOLOGY:  Dg Chest Port 1 View  Result Date: 08/19/2018 CLINICAL DATA:  Status post fall. EXAM: PORTABLE CHEST 1 VIEW COMPARISON:  Chest radiograph 08/01/2008 FINDINGS: Normal cardiac and mediastinal contours. No consolidative pulmonary opacities. No pleural effusion or pneumothorax. Thoracic spine degenerative changes. IMPRESSION: No acute cardiopulmonary process. Electronically Signed   By: Annia Beltrew  Davis M.D.   On: 08/19/2018 04:53   Dg Hip Unilat  With Pelvis 2-3 Views Right  Result Date: 08/19/2018 CLINICAL DATA:  Patient status post fall.  Initial encounter. EXAM: DG HIP (WITH OR WITHOUT PELVIS) 2-3V RIGHT COMPARISON:  None. FINDINGS: There is a comminuted displaced intertrochanteric fracture through the proximal right femur. SI joints are unremarkable. Lumbar spine degenerative change. IMPRESSION: Comminuted displaced right intertrochanteric proximal femur fracture. Electronically Signed   By: Annia Beltrew  Davis M.D.   On: 08/19/2018 04:52   Dg Femur Min 2 Views Right  Result Date: 08/19/2018 CLINICAL DATA:  Right hip fracture.  Evaluate distal femur. EXAM: RIGHT FEMUR 2 VIEWS COMPARISON:  Hip radiograph  08/19/2018 FINDINGS: Two radiographic images of the distal femur were obtained demonstrating no evidence for displaced  fracture. Mild degenerative changes of the knee joint. Regional soft tissues unremarkable. Known intertrochanteric proximal right femur fracture demonstrated on prior hip films. IMPRESSION: No acute process involving the distal right femur. Patient has known proximal right femur fracture. Electronically Signed   By: Lovey Newcomer M.D.   On: 08/19/2018 06:01    EKG:   Orders placed or performed during the hospital encounter of 08/19/18  . ED EKG  . ED EKG    ASSESSMENT AND PLAN:    This is an 78 year old female admitted for hip fracture. 1.  Acute right hip pain from Hip fracture: Right proximal femur.   IV morphine as needed for severe pain.   Dr. Gaspar Bidding orthopedic surgery  is planning to do surgery today  The patient is n.p.o. in anticipation of surgical repair. 2.  Hypertension: Hold lisinopril.  Blocker as needed prior to surgery. 3.  Hyponatremia: Mild; hydrate with normal saline.  Sodium at 132 patient is mentating fine.  Check a.m. labs 4.  Leukocytosis: Probably inflammatory response .the patient does not consistently meet criteria for sepsis.  Elevated white blood cell count secondary to stress response.  The patient is afebrile and intermittently bradycardic.  Continue to monitor for signs or symptoms of sepsis. 5.  DVT prophylaxis: SCDs.  The patient will need to restart aspirin and start Lovenox postoperatively 6.  GI prophylaxis: None    All the records are reviewed and case discussed with Care Management/Social Workerr. Management plans discussed with the patient, she is  in agreement.  CODE STATUS: fc  TOTAL TIME TAKING CARE OF THIS PATIENT: 35  minutes.   POSSIBLE D/C IN 2 DAYS, DEPENDING ON CLINICAL CONDITION.  Note: This dictation was prepared with Dragon dictation along with smaller phrase technology. Any transcriptional errors that result from this  process are unintentional.   Nicholes Mango M.D on 08/19/2018 at 2:54 PM  Between 7am to 6pm - Pager - [phone] After 6pm go to [email or web address] - password [email or web address] [facility]  [facility]  Office  [phone]  CC: Primary care physician; Steele Sizer, MD

## 2018-08-19 NOTE — ED Provider Notes (Signed)
[facility] Emergency Department Provider Note  ____________________________________________   First MD Initiated Contact with Patient 08/19/18 [phone]     (approximate)  I have reviewed the triage vital signs and the nursing notes.   HISTORY  Chief Complaint Hip Pain    HPI Sandra [name] is a 78 y.o. female who is generally relatively healthy for her age and presents for evaluation of acute onset pain and deformity in her right hip.  She reports that she lives at an assisted living facility and she got up the night to go the bathroom.  There was a power outage so she could not see and she tripped on the rug.  She fell suddenly and without warning and had acute onset pain in the upper right leg/hip.  It is turned outward and shortened.  She has no pain as long as she is remaining still but moving around because of severe pain.  She has no numbness nor tingling is able to void on her toes.   She denies any other injuries and did not hit her head nor lose consciousness.  She denies fever/chills, sore throat, chest pain, shortness of breath, cough, nausea, vomiting, abdominal pain, and denies any recent contact with COVID-19 patients.  She is not on any blood thinners.        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   TONSILLECTOMY      Prior to Admission medications   Medication Sig Start Date End Date Taking? Authorizing Provider  aspirin 81 MG tablet Take 81 mg by mouth every other day.    Yes [provider]  lisinopril (ZESTRIL) 10 MG tablet Take 1 tablet (10 mg total) by mouth daily. 05/14/18  Yes Hubbard Hartshorn, FNP  Multiple Vitamin (MULTIVITAMIN) tablet Take 1 tablet by mouth daily.   Yes [provider]    Allergies Patient has no known allergies.  Family History  Problem Relation Age of Onset   Hypertension Daughter    Hypertension Sister    Hypertension Brother     Social History Social History   Tobacco Use   Smoking status: Never Smoker   Smokeless tobacco: Never Used  Substance Use Topics   Alcohol use: No    Alcohol/week: 0.0 standard drinks   Drug use: No    Review of Systems Constitutional: No fever/chills Eyes: No visual changes. ENT: No sore throat. Cardiovascular: Denies chest pain. Respiratory: Denies shortness of breath. Gastrointestinal: No abdominal pain.  No nausea, no vomiting.  No diarrhea.  No constipation. Genitourinary: Negative for dysuria. Musculoskeletal: Acute onset pain in right hip as described above. Integumentary: Negative for rash. Neurological: Negative for headaches, focal weakness or  numbness.   ____________________________________________   PHYSICAL EXAM:  VITAL SIGNS: ED Triage Vitals  Enc Vitals Group     BP 08/19/18 0341 104/64     Pulse Rate 08/19/18 0341 60     Resp 08/19/18 0341 20     Temp 08/19/18 0341 97.9 F (36.6 C)     Temp Source 08/19/18 0341 Oral     SpO2 08/19/18 0341 99 %     Weight 08/19/18 0338 65.8 kg (145 lb)     Height 08/19/18 0338 1.702 m (5\' 7" )     Head Circumference --      Peak Flow --      Pain Score 08/19/18 0338 8     Pain Loc --      Pain Edu? --      Excl. in GC? --     Constitutional: Alert and oriented. Well appearing and  in no acute distress. Eyes: Conjunctivae are normal.  Head: Atraumatic. Nose: No congestion/rhinnorhea. Mouth/Throat: Mucous membranes are moist. Neck: No stridor.  No meningeal signs.   Cardiovascular: Normal rate, regular rhythm. Good peripheral circulation. Grossly normal heart sounds. Respiratory: Normal respiratory effort.  No retractions. No audible wheezing. Gastrointestinal: Soft and nontender. No distention.  Musculoskeletal: Externally rotated and shortened right lower extremity.  Neurovascularly intact.  Tenderness to palpation of the proximal femur.  Pelvis is stable. Neurologic:  Normal speech and language. No gross focal neurologic deficits are appreciated.  Skin:  Skin is warm, dry and intact. No rash noted. Psychiatric: Mood and affect are normal. Speech and behavior are normal.  ____________________________________________   LABS (all labs ordered are listed, but only abnormal results are displayed)  Labs Reviewed  COMPREHENSIVE METABOLIC PANEL - Abnormal; Notable for the following components:      Result Value   Sodium 132 (*)    Glucose, Bld 119 (*)    All other components within normal limits  CBC WITH DIFFERENTIAL/PLATELET - Abnormal; Notable for the following components:   WBC 19.3 (*)    RBC 3.73 (*)    Hemoglobin 11.7 (*)    HCT 34.1 (*)    Neutro Abs 16.0 (*)    Monocytes Absolute 1.6 (*)    Abs Immature Granulocytes 0.16 (*)    All other components within normal limits  SARS CORONAVIRUS 2 (HOSPITAL ORDER, PERFORMED IN [facility])  APTT  PROTIME-INR  URINALYSIS, ROUTINE W REFLEX MICROSCOPIC  ABO/RH  TYPE AND SCREEN   ____________________________________________  EKG  ED ECG REPORT I, Loleta Roseory Neida Ellegood, the attending physician, personally viewed and interpreted this ECG.  Date: 08/19/2018 EKG Time: 5:02 AM Rate: 59 Rhythm: normal sinus rhythm QRS Axis: normal Intervals: normal ST/T Wave abnormalities: normal Narrative  Interpretation: no evidence of acute ischemia  ____________________________________________  RADIOLOGY I, Loleta Roseory Maguire Sime, personally viewed and evaluated these images (plain radiographs) as part of my medical decision making, as well as reviewing the written report by the radiologist.  ED MD interpretation: No acute abnormality on chest x-ray.  Comminuted and displaced right intertrochanteric proximal femur fracture visualized on hip and femur radiographs.  Official radiology report(s): Dg Chest Port 1 View  Result Date: 08/19/2018 CLINICAL DATA:  Status post fall. EXAM: PORTABLE CHEST 1 VIEW COMPARISON:  Chest radiograph 08/01/2008 FINDINGS: Normal cardiac and mediastinal contours. No consolidative pulmonary opacities. No pleural effusion or pneumothorax. Thoracic spine degenerative changes. IMPRESSION: No acute cardiopulmonary process. Electronically Signed   By: Annia Beltrew  Davis M.D.   On: 08/19/2018 04:53   Dg  Hip Unilat  With Pelvis 2-3 Views Right  Result Date: 08/19/2018 CLINICAL DATA:  Patient status post fall.  Initial encounter. EXAM: DG HIP (WITH OR WITHOUT PELVIS) 2-3V RIGHT COMPARISON:  None. FINDINGS: There is a comminuted displaced intertrochanteric fracture through the proximal right femur. SI joints are unremarkable. Lumbar spine degenerative change. IMPRESSION: Comminuted displaced right intertrochanteric proximal femur fracture. Electronically Signed   By: Annia Beltrew  Davis M.D.   On: 08/19/2018 04:52   Dg Femur Min 2 Views Right  Result Date: 08/19/2018 CLINICAL DATA:  Right hip fracture.  Evaluate distal femur. EXAM: RIGHT FEMUR 2 VIEWS COMPARISON:  Hip radiograph 08/19/2018 FINDINGS: Two radiographic images of the distal femur were obtained demonstrating no evidence for displaced fracture. Mild degenerative changes of the knee joint. Regional soft tissues unremarkable. Known intertrochanteric proximal right femur fracture demonstrated on prior hip films. IMPRESSION: No acute process  involving the distal right femur. Patient has known proximal right femur fracture. Electronically Signed   By: Annia Beltrew  Davis M.D.   On: 08/19/2018 06:01    ____________________________________________   PROCEDURES   Procedure(s) performed (including Critical Care):  Procedures   ____________________________________________   INITIAL IMPRESSION / MDM / ASSESSMENT AND PLAN / ED COURSE  As part of my medical decision making, I reviewed the following data within the electronic MEDICAL RECORD NUMBER Nursing notes reviewed and incorporated, Labs reviewed , EKG interpreted , Old chart reviewed, Radiograph reviewed , Discussed with admitting physician (Dr. Sheryle Hailiamond), Discussed with orthopedic surgeon (Dr. Franco ColletNappo) and reviewed Notes from prior ED visits   Differential diagnosis includes, but is not limited to, intertrochanteric femur fracture, other nonspecific hip fracture, pelvis fracture, hip dislocation.  I strongly suspect an intertrochanteric femur fracture and have ordered hip fracture preoperative work-up including chest x-ray, EKG, and lab work.  Analgesia as needed for pain control.  I have asked patient to remain n.p.o. and ordered 125 mL/h normal saline IV infusion.  Awaiting radiograph results.  The patient lives at a living facility but has not been in contact with anyone known to have COVID-19.  She will require preprocedural rapid testing, however, assuming she has a femur fracture that required surgery.      Clinical Course as of Aug 18 653  Wynelle LinkSun Aug 19, 2018  [id number] Dr. Franco ColletNappo requested dedicated femur films which I ordered.   [CF]  0435 Comminuted and impacted proximal femur fracture.  Paging Dr. Franco ColletNappo with orthopedics to discuss.   [CF]  [id number] Discussed case by phone with Dr. Franco ColletNappo who agrees with plan for medical admission.   [CF]  0559 Patient has a leukocytosis of 19.3 which is high for her age although she has no sign of infection and no recent infectious symptoms.  Foley catheter  will be placed for comfort and preoperative care and I have ordered a urinalysis.  Metabolic panel and coagulation studies are within normal limits.  Rapid coronavirus swab is pending.  I will contact the hospitalist for admission.   [CF]  0615 Dr. Sheryle Hailiamond acknowledged the admission.   [CF]    Clinical Course User Index [CF] Loleta RoseForbach, Lanika Colgate, MD     ____________________________________________  FINAL CLINICAL IMPRESSION(S) / ED DIAGNOSES  Final diagnoses:  Closed comminuted intertrochanteric fracture of proximal end of right femur, initial encounter (HCC)  Leukocytosis, unspecified type     MEDICATIONS GIVEN DURING THIS VISIT:  Medications  0.9 %  sodium chloride infusion ( Intravenous New Bag/Given 08/19/18 0520)  morphine 4 MG/ML injection 4 mg (has no administration  in time range)  sodium chloride 0.9 % bolus 1,000 mL (1,000 mLs Intravenous New Bag/Given 08/19/18 [id number])     ED Discharge Orders    None      *Please note:  [place] Stone[place] was evaluated in Emergency Department on 08/19/2018 for the symptoms described in the history of present illness. She was evaluated in the context of the global COVID-19 pandemic, which necessitated consideration that the patient might be at risk for infection with the SARS-CoV-2 virus that causes COVID-19. Institutional protocols and algorithms that pertain to the evaluation of patients at risk for COVID-19 are in a state of rapid change based on information released by regulatory bodies including the CDC and federal and state organizations. These policies and algorithms were followed during the patient's care in the ED.  Some ED evaluations and interventions may be delayed as a result of limited staffing during the pandemic.*  Note:  This document was prepared using Dragon voice recognition software and may include unintentional dictation errors.   Loleta RoseForbach, Jayin Derousse, MD 08/19/18 [phone]

## 2018-08-19 NOTE — Anesthesia Post-op Follow-up Note (Signed)
Anesthesia QCDR form completed.        

## 2018-08-19 NOTE — ED Notes (Signed)
Daughter updated on pt's care. Daughter at bedside.
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
## 2018-08-19 NOTE — H&P (Addendum)
[place] Sandra Sandra [name] is an 78 y.o. female.   Chief Complaint: Fall HPI: The patient with past medical history of hypertension presents to the emergency department complaining of right hip pain.  The patient tripped and fell and immediately felt pain in the right leg and hip that was exacerbated by movement.  X-ray revealed a severely comminuted fracture of the right proximal femur.  Orthopedic surgery was notified and the hospitalist service was called for admission.  Past Medical History:  Diagnosis Date  . Anxiety   . Bell'Sandra [name] palsy   . Changes in skin texture   . Hair loss   . Hiatal hernia   . Hypertension   . Murmur, cardiac   . Nocturia   . Ovarian failure     Past Surgical History:  Procedure Laterality Date  . ABDOMINAL HYSTERECTOMY    . MULTIPLE TOOTH EXTRACTIONS N/A    patient had all of her bottom teeth pulled about 2-3 weeks ago.  . TONSILLECTOMY      Family History  Problem Relation Age of Onset  . Hypertension Daughter   . Hypertension Sister   . Hypertension Brother    Social History:  reports that she has never smoked. She has never used smokeless tobacco. She reports that she does not drink alcohol or use drugs.  Allergies: No Known Allergies  Medications Prior to Admission  Medication Sig Dispense Refill  . aspirin 81 MG tablet Take 81 mg by mouth every other day.     . lisinopril (ZESTRIL) 10 MG tablet Take 1 tablet (10 mg total) by mouth daily. 90 tablet 2  . Multiple Vitamin (MULTIVITAMIN) tablet Take 1 tablet by mouth daily.      Results for orders placed or performed during the hospital encounter of 08/19/18 (from the past 48 hour(Sandra [name]))  Comprehensive metabolic panel     Status: Abnormal   Collection Time: 08/19/18  5:06 AM  Result Value Ref Range   Sodium 132 (L) 135 - 145 mmol/L   Potassium 4.1 3.5 - 5.1 mmol/L   Chloride 101 98 - 111 mmol/L   CO2 24 22 - 32 mmol/L   Glucose, Bld 119 (H) 70 - 99 mg/dL   BUN 14 8 - 23 mg/dL   Creatinine, Ser 1.610.80  0.44 - 1.00 mg/dL   Calcium 8.9 8.9 - 09.610.3 mg/dL   Total Protein 6.9 6.5 - 8.1 g/dL   Albumin 3.7 3.5 - 5.0 g/dL   AST 32 15 - 41 U/L   ALT 23 0 - 44 U/L   Alkaline Phosphatase 68 38 - 126 U/L   Total Bilirubin 0.6 0.3 - 1.2 mg/dL   GFR calc non Af Amer >60 >60 mL/min   GFR calc Af Amer >60 >60 mL/min   Anion gap 7 5 - 15    Comment: Performed at [facility], [address] [place], [place] [address]  CBC WITH DIFFERENTIAL     Status: Abnormal   Collection Time: 08/19/18  5:06 AM  Result Value Ref Range   WBC 19.3 (H) 4.0 - 10.5 K/uL   RBC 3.73 (L) 3.87 - 5.11 MIL/uL   Hemoglobin 11.7 (L) 12.0 - 15.0 g/dL   HCT 98.134.1 (L) 19.136.0 - 47.846.0 %   MCV 91.4 80.0 - 100.0 fL   MCH 31.4 26.0 - 34.0 pg   MCHC 34.3 30.0 - 36.0 g/dL   RDW 29.512.3 62.111.5 - 30.815.5 %   Platelets 224 150 - 400 K/uL   nRBC 0.0 0.0 -  0.2 %   Neutrophils Relative % 83 %   Neutro Abs 16.0 (H) 1.7 - 7.7 K/uL   Lymphocytes Relative 8 %   Lymphs Abs 1.5 0.7 - 4.0 K/uL   Monocytes Relative 8 %   Monocytes Absolute 1.6 (H) 0.1 - 1.0 K/uL   Eosinophils Relative 0 %   Eosinophils Absolute 0.0 0.0 - 0.5 K/uL   Basophils Relative 0 %   Basophils Absolute 0.1 0.0 - 0.1 K/uL   Immature Granulocytes 1 %   Abs Immature Granulocytes 0.16 (H) 0.00 - 0.07 K/uL    Comment: Performed at [facility], [address] [place], [place] [address]  APTT     Status: None   Collection Time: 08/19/18  5:06 AM  Result Value Ref Range   aPTT 26 24 - 36 seconds    Comment: Performed at [facility], [address] [place], [place] [address]  Protime-INR     Status: None   Collection Time: 08/19/18  5:06 AM  Result Value Ref Range   Prothrombin Time 12.8 11.4 - 15.2 seconds   INR 1.0 0.8 - 1.2    Comment: (NOTE) INR goal varies based on device and disease states. Performed at [facility], [address] [place], [place] [address]   SARS Coronavirus 2 [name](Hospital order, Performed in [facility]  hospital lab) Nasopharyngeal Nasopharyngeal Swab     Status: None   Collection Time: 08/19/18  5:06 AM   Specimen: Nasopharyngeal Swab  Result Value Ref Range   SARS Coronavirus 2 NEGATIVE NEGATIVE    Comment: (NOTE) If result is NEGATIVE SARS-CoV-2 target nucleic acids are NOT DETECTED. The SARS-CoV-2 RNA is generally detectable in upper and lower  respiratory specimens during the acute phase of infection. The lowest  concentration of SARS-CoV-2 viral copies this assay can detect is 250  copies / mL. A negative result does not preclude SARS-CoV-2 infection  and should not be used as the sole basis for treatment or other  patient management decisions.  A negative result may occur with  improper specimen collection / handling, submission of specimen other  than nasopharyngeal swab, presence of viral mutation(Sandra [name]) within the  areas targeted by this assay, and inadequate number of viral copies  (<250 copies / mL). A negative result must be combined with clinical  observations, patient history, and epidemiological information. If result is POSITIVE SARS-CoV-2 target nucleic acids are DETECTED. The SARS-CoV-2 RNA is generally detectable in upper and lower  respiratory specimens dur ing the acute phase of infection.  Positive  results are indicative of active infection with SARS-CoV-2.  Clinical  correlation with patient history and other diagnostic information is  necessary to determine patient infection status.  Positive results do  not rule out bacterial infection or co-infection with other viruses. If result is PRESUMPTIVE POSTIVE SARS-CoV-2 nucleic acids MAY BE PRESENT.   A presumptive positive result was obtained on the submitted specimen  and confirmed on repeat testing.  While 2019 novel coronavirus  (SARS-CoV-2) nucleic acids may be present in the submitted sample  additional confirmatory testing may be necessary for epidemiological  and / or clinical management purposes  to  differentiate between  SARS-CoV-2 and other Sarbecovirus currently known to infect humans.  If clinically indicated additional testing with an alternate test  methodology [phone] is advised. The SARS-CoV-2 RNA is generally  detectable in upper and lower respiratory sp ecimens during the acute  phase of infection. The expected result is Negative. Fact  Sheet for Patients:  StrictlyIdeas.no Fact Sheet for Healthcare Providers: BankingDealers.co.za This test is not yet approved or cleared by the [place] FDA and has been authorized for detection and/or diagnosis of SARS-CoV-2 by FDA under an Emergency Use Authorization (EUA).  This EUA will remain in effect (meaning this test can be used) for the duration of the COVID-19 declaration under Section 564(b)(1) of the Act, 21 U.[place]andra [name].C. section 360bbb-3(b)(1), unless the authorization is terminated or revoked sooner. Performed at [facility], [address] [place], [place] [address]    Dg Chest Port 1 View  Result Date: 08/19/2018 CLINICAL DATA:  Status post fall. EXAM: PORTABLE CHEST 1 VIEW COMPARISON:  Chest radiograph 08/01/2008 FINDINGS: Normal cardiac and mediastinal contours. No consolidative pulmonary opacities. No pleural effusion or pneumothorax. Thoracic spine degenerative changes. IMPRESSION: No acute cardiopulmonary process. Electronically Signed   By: Lovey Newcomer M.D.   On: 08/19/2018 04:53   Dg Hip Unilat  With Pelvis 2-3 Views Right  Result Date: 08/19/2018 CLINICAL DATA:  Patient status post fall.  Initial encounter. EXAM: DG HIP (WITH OR WITHOUT PELVIS) 2-3V RIGHT COMPARISON:  None. FINDINGS: There is a comminuted displaced intertrochanteric fracture through the proximal right femur. SI joints are unremarkable. Lumbar spine degenerative change. IMPRESSION: Comminuted displaced right intertrochanteric proximal femur fracture. Electronically Signed   By: Lovey Newcomer M.D.    On: 08/19/2018 04:52   Dg Femur Min 2 Views Right  Result Date: 08/19/2018 CLINICAL DATA:  Right hip fracture.  Evaluate distal femur. EXAM: RIGHT FEMUR 2 VIEWS COMPARISON:  Hip radiograph 08/19/2018 FINDINGS: Two radiographic images of the distal femur were obtained demonstrating no evidence for displaced fracture. Mild degenerative changes of the knee joint. Regional soft tissues unremarkable. Known intertrochanteric proximal right femur fracture demonstrated on prior hip films. IMPRESSION: No acute process involving the distal right femur. Patient has known proximal right femur fracture. Electronically Signed   By: Lovey Newcomer M.D.   On: 08/19/2018 06:01    Review of Systems  Constitutional: Negative for chills and fever.  HENT: Negative for sore throat and tinnitus.   Eyes: Negative for blurred vision and redness.  Respiratory: Negative for cough and shortness of breath.   Cardiovascular: Negative for chest pain, palpitations, orthopnea and PND.  Gastrointestinal: Negative for abdominal pain, diarrhea, nausea and vomiting.  Genitourinary: Negative for dysuria, frequency and urgency.  Musculoskeletal: Positive for falls and joint pain. Negative for myalgias.  Skin: Negative for rash.       No lesions  Neurological: Negative for speech change, focal weakness and weakness.  Endo/Heme/Allergies: Does not bruise/bleed easily.       No temperature intolerance  Psychiatric/Behavioral: Negative for depression and suicidal ideas.    Blood pressure (!) 114/51, pulse (!) 58, temperature 97.9 F (36.6 C), temperature source Oral, resp. rate 20, height 5\' 7"  (1.702 m), weight 65.8 kg, SpO2 100 %. Physical Exam  Vitals reviewed. Constitutional: She is oriented to person, place, and time. She appears well-developed and well-nourished. No distress.  HENT:  Head: Normocephalic and atraumatic.  Mouth/Throat: Oropharynx is clear and moist.  Eyes: Pupils are equal, round, and reactive to light.  Conjunctivae and EOM are normal. No scleral icterus.  Neck: Normal range of motion. Neck supple. No JVD present. No tracheal deviation present. No thyromegaly present.  Cardiovascular: Normal rate, regular rhythm and normal heart sounds. Exam reveals no gallop and no friction rub.  No murmur heard. Respiratory: Effort normal and breath sounds normal.  GI: Soft.  Bowel sounds are normal. She exhibits no distension. There is no abdominal tenderness.  Genitourinary:    Genitourinary Comments: Deferred   Musculoskeletal:        General: Deformity present. No edema.     Comments: Right leg shortened and internally rotated  Lymphadenopathy:    She has no cervical adenopathy.  Neurological: She is alert and oriented to person, place, and time. No cranial nerve deficit. She exhibits normal muscle tone.  Skin: Skin is warm and dry. No rash noted. No erythema.  Psychiatric: She has a normal mood and affect. Her behavior is normal. Judgment and thought content normal.     Assessment/Plan This is an 78 year old female admitted for hip fracture. 1.  Hip fracture: Right proximal femur.  IV morphine as needed for severe pain.  Consult orthopedic surgery.  The patient is n.p.o. in anticipation of surgical repair. 2.  Hypertension: Hold lisinopril.  Beta blocker as needed prior to surgery. 3.  Hyponatremia: Mild; hydrate with normal saline 4.  Leukocytosis: The patient does not consistently meet criteria for sepsis.  Elevated white blood cell count secondary to stress response.  The patient is afebrile and intermittently bradycardic.  Continue to monitor for signs or symptoms of sepsis. 5.  DVT prophylaxis: SCDs.  The patient will need to restart aspirin and start Lovenox postoperatively 6.  GI prophylaxis: None Patient is a full code.  Time spent on admission orders and patient care approximately 45 minutes  Arnaldo Nataliamond,  Sandra Burr S, MD 08/19/2018, 6:45 AM

## 2018-08-19 NOTE — ED Notes (Signed)
Pt declines urinary cath.

## 2018-08-19 NOTE — Progress Notes (Signed)
Patient sent to OR.

## 2018-08-19 NOTE — ED Notes (Signed)
Report to lorrie, rn.  

## 2018-08-19 NOTE — Anesthesia Procedure Notes (Signed)
Spinal  Patient location during procedure: OR Staffing Anesthesiologist: Martha Clan, MD Resident/CRNA: Rolla Plate, CRNA Performed: resident/CRNA  Preanesthetic Checklist Completed: patient identified, site marked, surgical consent, pre-op evaluation, timeout performed, IV checked, risks and benefits discussed and monitors and equipment checked Spinal Block Patient position: right lateral decubitus Prep: ChloraPrep and site prepped and draped Patient monitoring: heart rate, continuous pulse ox, blood pressure and cardiac monitor Approach: midline Location: L4-5 Injection technique: single-shot Needle Needle type: Introducer and Pencan  Needle gauge: 24 G Needle length: 9 cm Additional Notes Negative paresthesia. Negative blood return. Positive free-flowing CSF. Expiration date of kit checked and confirmed. Patient tolerated procedure well, without complications.

## 2018-08-19 NOTE — Anesthesia Preprocedure Evaluation (Signed)
Anesthesia Evaluation  Patient identified by MRN, date of birth, ID band Patient awake    Reviewed: Allergy & Precautions, H&P , NPO status , Patient's Chart, lab work & pertinent test results, reviewed documented beta blocker date and time   History of Anesthesia Complications Negative for: history of anesthetic complications  Airway Mallampati: II  TM Distance: >3 FB Neck ROM: full    Dental  (+) Dental Advidsory Given, Upper Dentures, Lower Dentures, Edentulous Upper, Edentulous Lower   Pulmonary neg pulmonary ROS,    Pulmonary exam normal        Cardiovascular Exercise Tolerance: Good hypertension, (-) angina(-) CAD and (-) Past MI Normal cardiovascular exam(-) dysrhythmias + Valvular Problems/Murmurs      Neuro/Psych neg Seizures PSYCHIATRIC DISORDERS Anxiety  Neuromuscular disease (Bell's Palsy)    GI/Hepatic Neg liver ROS, hiatal hernia, GERD  ,  Endo/Other  negative endocrine ROS  Renal/GU negative Renal ROS  negative genitourinary   Musculoskeletal   Abdominal   Peds  Hematology negative hematology ROS (+)   Anesthesia Other Findings Past Medical History: No date: Anxiety No date: Bell's palsy No date: Changes in skin texture No date: Hair loss No date: Hiatal hernia No date: Hypertension No date: Murmur, cardiac No date: Nocturia No date: Ovarian failure   Reproductive/Obstetrics negative OB ROS                             Anesthesia Physical Anesthesia Plan  ASA: II  Anesthesia Plan: Spinal   Post-op Pain Management:    Induction:   PONV Risk Score and Plan: 2 and Propofol infusion and TIVA  Airway Management Planned: Natural Airway and Simple Face Mask  Additional Equipment:   Intra-op Plan:   Post-operative Plan:   Informed Consent: I have reviewed the patients History and Physical, chart, labs and discussed the procedure including the risks, benefits and  alternatives for the proposed anesthesia with the patient or authorized representative who has indicated his/her understanding and acceptance.     Dental Advisory Given  Plan Discussed with: Anesthesiologist, CRNA and Surgeon  Anesthesia Plan Comments:         Anesthesia Quick Evaluation

## 2018-08-20 LAB — CBC
HCT: 21.5 % — ABNORMAL LOW (ref 36.0–46.0)
Hemoglobin: 7.4 g/dL — ABNORMAL LOW (ref 12.0–15.0)
MCH: 31.6 pg (ref 26.0–34.0)
MCHC: 34.4 g/dL (ref 30.0–36.0)
MCV: 91.9 fL (ref 80.0–100.0)
Platelets: 127 10*3/uL — ABNORMAL LOW (ref 150–400)
RBC: 2.34 MIL/uL — ABNORMAL LOW (ref 3.87–5.11)
RDW: 12.6 % (ref 11.5–15.5)
WBC: 6 10*3/uL (ref 4.0–10.5)
nRBC: 0 % (ref 0.0–0.2)

## 2018-08-20 LAB — BASIC METABOLIC PANEL
Anion gap: 9 (ref 5–15)
BUN: 13 mg/dL (ref 8–23)
CO2: 18 mmol/L — ABNORMAL LOW (ref 22–32)
Calcium: 7.6 mg/dL — ABNORMAL LOW (ref 8.9–10.3)
Chloride: 102 mmol/L (ref 98–111)
Creatinine, Ser: 0.82 mg/dL (ref 0.44–1.00)
GFR calc Af Amer: >60 mL/min (ref >60)
GFR calc non Af Amer: >60 mL/min (ref >60)
Glucose, Bld: 119 mg/dL — ABNORMAL HIGH (ref 70–99)
Potassium: 3.8 mmol/L (ref 3.5–5.1)
Sodium: 129 mmol/L — ABNORMAL LOW (ref 135–145)

## 2018-08-20 LAB — HEMOGLOBIN AND HEMATOCRIT, BLOOD
HCT: 20 % — ABNORMAL LOW (ref 36.0–46.0)
Hemoglobin: 6.9 g/dL — ABNORMAL LOW (ref 12.0–15.0)

## 2018-08-20 NOTE — Anesthesia Postprocedure Evaluation (Signed)
Anesthesia Post Note  Patient: Sandra [name]  Procedure(s) Performed: INTRAMEDULLARY (IM) NAIL INTERTROCHANTRIC (Right )  Patient location during evaluation: Nursing Unit Anesthesia Type: Spinal Level of consciousness: awake and alert and oriented Pain management: pain level controlled Vital Signs Assessment: post-procedure vital signs reviewed and stable Respiratory status: spontaneous breathing and nonlabored ventilation Cardiovascular status: stable Postop Assessment: no headache, no backache, patient able to bend at knees, no apparent nausea or vomiting and adequate PO intake Anesthetic complications: no     Last Vitals:  Vitals:   08/20/18 0312 08/20/18 0418  BP: (!) 116/51 (!) 112/55  Pulse: 74 79  Resp: 18 18  Temp: 36.9 C 36.9 C  SpO2: 100% 100%    Last Pain:  Vitals:   08/20/18 0418  TempSrc: Oral  PainSc:                  Sandra [name]

## 2018-08-20 NOTE — Evaluation (Signed)
Physical Therapy Evaluation Patient Details Name: Sandra [name] MRN: [id number] DOB: 1940/06/08 Today's Date: 08/20/2018   History of Present Illness  From MD H&P: The pt is an 78 yo female with past medical history of hypertension presents to the emergency department complaining of right hip pain.  The patient tripped and fell and immediately felt pain in the right leg and hip that was exacerbated by movement.  X-ray revealed a severely comminuted fracture of the right proximal femur.  Pt is now s/p right pertrochanteric femur fracture closed reduction and long cephalomedullary nail.    Clinical Impression  Pt presents with deficits in strength, transfers, mobility, gait, balance, and activity tolerance.  Pt required extensive assistance with bed mobility tasks and min A to stand from an elevated EOB.  The pt performed some gentle standing therex and was able to take 1-2 very small, effortful steps with min A for stability and heavy lean on the RW.  Pt then began listing anteriorly and was not responding to questions and was returned to supine.  Pt quickly returned to baseline once in supine and stated that she "just began feeling hot".  Nursing notified and BP taken at 135/54 mmHg.  Pt will benefit from PT services in a SNF setting upon discharge to safely address above deficits for decreased caregiver assistance and eventual return to PLOF.      Follow Up Recommendations SNF    Equipment Recommendations  Other (comment)(TBD at next venue of care)    Recommendations for Other Services       Precautions / Restrictions Precautions Precautions: Fall Restrictions Weight Bearing Restrictions: Yes RLE Weight Bearing: Weight bearing as tolerated      Mobility  Bed Mobility Overal bed mobility: Needs Assistance Bed Mobility: Supine to Sit;Sit to Supine     Supine to sit: Max assist Sit to supine: Max assist   General bed mobility comments: Mod verbal cues for  sequencing  Transfers Overall transfer level: Needs assistance Equipment used: Rolling walker (2 wheeled) Transfers: Sit to/from Stand Sit to Stand: Min assist;From elevated surface         General transfer comment: Min verbal cues for sequencing for hand placement and increased trunk flexion  Ambulation/Gait   Gait Distance (Feet): 1 Feet Assistive device: Rolling walker (2 wheeled) Gait Pattern/deviations: Step-to pattern;Decreased step length - right;Decreased step length - left;Decreased stance time - right Gait velocity: decreased   General Gait Details: Pt able to take 1-2 very small effortful steps at the EOB with difficulty advancing the LLE.  Stairs            Wheelchair Mobility    Modified Rankin (Stroke Patients Only)       Balance Overall balance assessment: Needs assistance   Sitting balance-Leahy Scale: Good     Standing balance support: Bilateral upper extremity supported Standing balance-Leahy Scale: Poor Standing balance comment: Min A for stability in standing with heavy lean on the RW                             Pertinent Vitals/Pain Pain Assessment: 0-10 Pain Score: 2  Pain Location: back Pain Descriptors / Indicators: Aching;Sore Pain Intervention(s): Premedicated before session;Monitored during session    [place] expects to be discharged to:: Private residence Living Arrangements: Alone Available Help at Discharge: Family;Available PRN/intermittently;Other (Comment)(Call bells in the BR and living room) Type of Home: Independent living facility Home Access: Level entry  Home Layout: One level Home Equipment: Grab bars - tub/shower;Grab bars - toilet      Prior Function Level of Independence: Independent         Comments: Ind amb community distances without an AD, Ind with ADLs, no other fall history     Hand Dominance        Extremity/Trunk Assessment   Upper Extremity  Assessment Upper Extremity Assessment: Generalized weakness    Lower Extremity Assessment Lower Extremity Assessment: Generalized weakness;RLE deficits/detail RLE: Unable to fully assess due to pain RLE Sensation: WNL       Communication   Communication: HOH  Cognition Arousal/Alertness: Awake/alert Behavior During Therapy: WFL for tasks assessed/performed Overall Cognitive Status: Within Functional Limits for tasks assessed                                        General Comments      Exercises Total Joint Exercises Ankle Circles/Pumps: AROM;Both;10 reps;5 reps Quad Sets: Strengthening;Both;5 reps;10 reps Heel Slides: AAROM;AROM;Both;5 reps Hip ABduction/ADduction: AAROM;Right;5 reps Straight Leg Raises: AAROM;Right;5 reps Long Arc Quad: AROM;Both;5 reps;10 reps Knee Flexion: AROM;Both;5 reps;10 reps   Assessment/Plan    PT Assessment Patient needs continued PT services  PT Problem List Decreased strength;Decreased activity tolerance;Decreased balance;Decreased mobility;Decreased knowledge of use of DME       PT Treatment Interventions DME instruction;Gait training;Functional mobility training;Therapeutic activities;Therapeutic exercise;Balance training;Patient/family education    PT Goals (Current goals can be found in the Care Plan section)  Acute Rehab PT Goals Patient Stated Goal: To get stronger and to get back to walking PT Goal Formulation: With patient Time For Goal Achievement: 09/02/18 Potential to Achieve Goals: Good    Frequency BID   Barriers to discharge Inaccessible home environment;Decreased caregiver support      Co-evaluation               AM-PAC PT "6 Clicks" Mobility  Outcome Measure Help needed turning from your back to your side while in a flat bed without using bedrails?: A Lot Help needed moving from lying on your back to sitting on the side of a flat bed without using bedrails?: A Lot Help needed moving to and  from a bed to a chair (including a wheelchair)?: A Lot Help needed standing up from a chair using your arms (e.g., wheelchair or bedside chair)?: A Lot Help needed to walk in hospital room?: Total Help needed climbing 3-5 steps with a railing? : Total 6 Click Score: 10    End of Session Equipment Utilized During Treatment: Gait belt Activity Tolerance: Treatment limited secondary to medical complications (Comment)(After amb attempts pt presented with syncope and was assisted back to supine and quickly returned to baseline, nursing notified.) Patient left: in bed;with call bell/phone within reach;with bed alarm set;with nursing/sitter in room;with family/visitor present Nurse Communication: Mobility status(After amb attempts pt presented with syncope and was assisted back to supine and quickly returned to baseline, nursing notified.) PT Visit Diagnosis: Unsteadiness on feet (R26.81);Other abnormalities of gait and mobility (R26.89);Muscle weakness (generalized) (M62.81)    Time: [id number] PT Time Calculation (min) (ACUTE ONLY): 55 min   Charges:   PT Evaluation $PT Eval Low Complexity: 1 Low PT Treatments $Therapeutic Exercise: 8-22 mins        D. Elly ModenaScott Avantae Bither PT, DPT 08/20/18, 3:58 PM

## 2018-08-20 NOTE — Plan of Care (Signed)
  Problem: Education: Goal: Knowledge of General Education information will improve Description: Including pain rating scale, medication(s)/side effects and non-pharmacologic comfort measures Outcome: Progressing   Problem: Health Behavior/Discharge Planning: Goal: Ability to manage health-related needs will improve Outcome: Progressing   Problem: Clinical Measurements: Goal: Ability to maintain clinical measurements within normal limits will improve Outcome: Progressing Goal: Will remain free from infection Outcome: Progressing Goal: Diagnostic test results will improve Outcome: Progressing Goal: Respiratory complications will improve Outcome: Progressing Goal: Cardiovascular complication will be avoided Outcome: Progressing   Problem: Activity: Goal: Risk for activity intolerance will decrease Outcome: Progressing   Problem: Nutrition: Goal: Adequate nutrition will be maintained Outcome: Progressing   Problem: Coping: Goal: Level of anxiety will decrease Outcome: Progressing   Problem: Elimination: Goal: Will not experience complications related to bowel motility Outcome: Progressing Goal: Will not experience complications related to urinary retention Outcome: Progressing   Problem: Pain Managment: Goal: General experience of comfort will improve Outcome: Progressing   Problem: Safety: Goal: Ability to remain free from injury will improve Outcome: Progressing   Problem: Skin Integrity: Goal: Risk for impaired skin integrity will decrease Outcome: Progressing   Problem: Activity: Goal: Ability to ambulate and perform ADLs will improve Outcome: Progressing   Problem: Education: Goal: Verbalization of understanding the information provided (i.e., activity precautions, restrictions, etc) will improve Outcome: Progressing Goal: Individualized Educational Video(s) Outcome: Progressing

## 2018-08-20 NOTE — Progress Notes (Addendum)
[facility] [place] NAME: Sandra [name]    MR#:  [id number]  DATE OF BIRTH:  05/01/1940  SUBJECTIVE:   Patient states she is feeling a little bit better today.  She endorses some hip pain whenever she tries to move around.  She has not been out of bed since her surgery.  REVIEW OF SYSTEMS:  CONSTITUTIONAL: No fever, fatigue or weakness.  EYES: No blurred or double vision.  EARS, NOSE, AND THROAT: No tinnitus or ear pain.  RESPIRATORY: No cough, shortness of breath, wheezing or hemoptysis.  CARDIOVASCULAR: No chest pain, orthopnea, edema.  GASTROINTESTINAL: No nausea, vomiting, diarrhea or abdominal pain.  GENITOURINARY: No dysuria, hematuria.  ENDOCRINE: No polyuria, nocturia,  HEMATOLOGY: No anemia, easy bruising or bleeding SKIN: No rash or lesion. MUSCULOSKELETAL: +Right hip pain NEUROLOGIC: No tingling, numbness, weakness.  PSYCHIATRY: No anxiety or depression.   DRUG ALLERGIES:  No Known Allergies  VITALS:  Blood pressure (!) 124/51, pulse 84, temperature 99.4 F (37.4 C), resp. rate 18, height 5\' 7"  (1.702 m), weight 63.1 kg, SpO2 100 %.  PHYSICAL EXAMINATION:  GENERAL:  78 y.o.-year-old patient lying in the bed with no acute distress.  EYES: Pupils equal, round, reactive to light and accommodation. No scleral icterus. Extraocular muscles intact. + Conjunctival pallor HEENT: Head atraumatic, normocephalic. Oropharynx and nasopharynx clear.  NECK:  Supple, no jugular venous distention. No thyroid enlargement, no tenderness.  LUNGS: Normal breath sounds bilaterally, no wheezing, rales,rhonchi or crepitation. No use of accessory muscles of respiration.  CARDIOVASCULAR: RRR, S1, S2 normal. No murmurs, rubs, or gallops.  ABDOMEN: Soft, nontender, nondistended. Bowel sounds present.  EXTREMITIES: no pedal edema, cyanosis, or clubbing. + Dry bandage in place over right lateral hip. NEUROLOGIC: Cranial nerves II through XII  are intact. Sensation intact. Gait not checked.  PSYCHIATRIC: The patient is alert and oriented x 3.  SKIN: No obvious rash, lesion, or ulcer.    LABORATORY PANEL:   CBC Recent Labs  Lab 08/20/18 0939  WBC 6.0  HGB 7.4*  HCT 21.5*  PLT 127*   ------------------------------------------------------------------------------------------------------------------  Chemistries  Recent Labs  Lab 08/19/18 0506 08/20/18 0939  NA 132* 129*  K 4.1 3.8  CL 101 102  CO2 24 18*  GLUCOSE 119* 119*  BUN 14 13  CREATININE 0.80 0.82  CALCIUM 8.9 7.6*  AST 32  --   ALT 23  --   ALKPHOS 68  --   BILITOT 0.6  --    ------------------------------------------------------------------------------------------------------------------  Cardiac Enzymes No results for input(s): TROPONINI in the last 168 hours. ------------------------------------------------------------------------------------------------------------------  RADIOLOGY:  Dg Chest Port 1 View  Result Date: 08/19/2018 CLINICAL DATA:  Status post fall. EXAM: PORTABLE CHEST 1 VIEW COMPARISON:  Chest radiograph 08/01/2008 FINDINGS: Normal cardiac and mediastinal contours. No consolidative pulmonary opacities. No pleural effusion or pneumothorax. Thoracic spine degenerative changes. IMPRESSION: No acute cardiopulmonary process. Electronically Signed   By: Lovey Newcomer M.D.   On: 08/19/2018 04:53   Dg Hip Operative Unilat W Or W/o Pelvis Right  Result Date: 08/19/2018 CLINICAL DATA:  Right hip ORIF EXAM: OPERATIVE RIGHT HIP (WITH PELVIS IF PERFORMED) 5 VIEWS TECHNIQUE: Fluoroscopic spot image(s) were submitted for interpretation post-operatively. COMPARISON:  None. FINDINGS: Five fluoroscopic images were obtained during open reduction internal fixation of a right femoral fracture. Fluoroscopy time was reported as 1 minutes 10 seconds. IMPRESSION: Intraoperative fluoroscopy. Electronically Signed   By: Ulyses Jarred M.D.   On: 08/19/2018 21:43  Dg Hip Unilat  With Pelvis 2-3 Views Right  Result Date: 08/19/2018 CLINICAL DATA:  Patient status post fall.  Initial encounter. EXAM: DG HIP (WITH OR WITHOUT PELVIS) 2-3V RIGHT COMPARISON:  None. FINDINGS: There is a comminuted displaced intertrochanteric fracture through the proximal right femur. SI joints are unremarkable. Lumbar spine degenerative change. IMPRESSION: Comminuted displaced right intertrochanteric proximal femur fracture. Electronically Signed   By: Annia Beltrew  Davis M.D.   On: 08/19/2018 04:52   Dg Femur Min 2 Views Right  Result Date: 08/19/2018 CLINICAL DATA:  Right hip fracture.  Evaluate distal femur. EXAM: RIGHT FEMUR 2 VIEWS COMPARISON:  Hip radiograph 08/19/2018 FINDINGS: Two radiographic images of the distal femur were obtained demonstrating no evidence for displaced fracture. Mild degenerative changes of the knee joint. Regional soft tissues unremarkable. Known intertrochanteric proximal right femur fracture demonstrated on prior hip films. IMPRESSION: No acute process involving the distal right femur. Patient has known proximal right femur fracture. Electronically Signed   By: Annia Beltrew  Davis M.D.   On: 08/19/2018 06:01   Dg Femur Port, [place] 2 Views Right  Result Date: 08/19/2018 CLINICAL DATA:  Status post ORIF proximal right femur fracture. EXAM: RIGHT FEMUR PORTABLE 2 VIEW COMPARISON:  None. FINDINGS: IM nail and hip screw are identified status post open reduction and internal fixation of comminuted fracture deformity involving the proximal right femur. The hardware components and fracture fragments are in near anatomic alignment. No complicating features identified. Gas is identified within the soft tissues overlying the right hip. IMPRESSION: 1. Status post ORIF of proximal right femur fracture. Electronically Signed   By: Signa Kellaylor  Stroud M.D.   On: 08/19/2018 22:00    EKG:   Orders placed or performed during the hospital encounter of 08/19/18  . ED EKG  . ED EKG     ASSESSMENT AND PLAN:     Right hip fracture s/p repair on 8/2 -Ortho following- recommend WBAT, DVT prophylaxis x 6 weeks, follow-up with orthopedic surgery in 2 weeks -Pain control -PT recommending SNF  Acute blood loss anemia- per ortho, patient had a lot of blood loss in the OR.  She is not having any continued active bleeding.  Hemoglobin dropped from 11.7 > 7.4. -Discussed stopping Lovenox with Dr. Franco ColletNappo- he recommended continuing Lovenox for now -Check anemia panel -Transfusion threshold < 7 -Recheck hemoglobin this evening and again in the morning  Hypertension- BP normal -Holding home BP meds  Hyponatremia-worsening -Continue IV fluids  Leukocytosis- resolved.  Likely inflammatory response.   All the records are reviewed and case discussed with Care Management/Social Workerr. Management plans discussed with the patient, she is  in agreement.  CODE STATUS: Full  TOTAL TIME TAKING CARE OF THIS PATIENT: 40 minutes.   POSSIBLE D/C IN 1-2 DAYS, DEPENDING ON CLINICAL CONDITION.  Note: This dictation was prepared with Dragon dictation along with smaller phrase technology. Any transcriptional errors that result from this process are unintentional.   Hilton SinclairKaty D Tanveer Dobberstein M.D on 08/20/2018 at 4:18 PM  Between 7am to 6pm - Pager - [phone]  After 6pm go to [email or web address] - password [email or web address] [facility]  [facility]  Office  [phone]  CC: Primary care physician; Alba CorySowles, Krichna, MD

## 2018-08-21 LAB — IRON AND TIBC
Iron: 14 ug/dL — ABNORMAL LOW (ref 28–170)
Saturation Ratios: 8 % — ABNORMAL LOW (ref 10.4–31.8)
TIBC: 183 ug/dL — ABNORMAL LOW (ref 250–450)
UIBC: 169 ug/dL

## 2018-08-21 LAB — CBC
HCT: 19 % — ABNORMAL LOW (ref 36.0–46.0)
Hemoglobin: 6.3 g/dL — ABNORMAL LOW (ref 12.0–15.0)
MCH: 30.9 pg (ref 26.0–34.0)
MCHC: 33.2 g/dL (ref 30.0–36.0)
MCV: 93.1 fL (ref 80.0–100.0)
Platelets: 128 10*3/uL — ABNORMAL LOW (ref 150–400)
RBC: 2.04 MIL/uL — ABNORMAL LOW (ref 3.87–5.11)
RDW: 12.6 % (ref 11.5–15.5)
WBC: 7.8 10*3/uL (ref 4.0–10.5)
nRBC: 0 % (ref 0.0–0.2)

## 2018-08-21 LAB — HEMOGLOBIN AND HEMATOCRIT, BLOOD
HCT: 27 % — ABNORMAL LOW (ref 36.0–46.0)
Hemoglobin: 9.1 g/dL — ABNORMAL LOW (ref 12.0–15.0)

## 2018-08-21 LAB — FERRITIN: Ferritin: 87 ng/mL (ref 11–307)

## 2018-08-21 LAB — PREPARE RBC (CROSSMATCH)

## 2018-08-21 MED ORDER — BISACODYL 10 MG RE SUPP
10.0000 mg | Freq: Every day | RECTAL | Status: DC | PRN
  Administered 2018-08-22: 05:00:00 10 mg via RECTAL
  Filled 2018-08-21: qty 1

## 2018-08-21 MED ORDER — SODIUM CHLORIDE 0.9% IV SOLUTION: Freq: Once | INTRAVENOUS | Status: DC

## 2018-08-21 MED ORDER — MAGNESIUM HYDROXIDE 400 MG/5ML PO SUSP
15.0000 mL | Freq: Every evening | ORAL | Status: DC | PRN
  Administered 2018-08-21: 19:00:00 15 mL via ORAL
  Filled 2018-08-21: qty 30

## 2018-08-21 MED ORDER — SODIUM CHLORIDE 0.9 % IV SOLN
INTRAVENOUS | Status: DC
  Administered 2018-08-21: 16:00:00 via INTRAVENOUS

## 2018-08-21 NOTE — [place] FL2 (Signed)
[place] MEDICAID FL2 LEVEL OF CARE SCREENING TOOL     IDENTIFICATION  Patient Name: [place] Birthdate: 07/24/1940 Sex: female Admission Date (Current Location): 08/19/2018  [place]   Facility and Address:  [facility], [address], [place], [place] [address]      Provider Number: [id number]  Attending Physician Name and Address:  Campbell StallMayo, Katy Dodd, MD  Relative Name and Phone Number:  Evern Bioamela Thomas [phone]    Current Level of Care: Hospital Recommended Level of Care: Skilled Nursing Facility Prior Approval Number:    Date Approved/Denied:   PASRR Number: [id number]  Discharge Plan: SNF    Current Diagnoses: Patient Active Problem List   Diagnosis Date Noted  . Hip fracture (HCC) 08/19/2018  . Hiatal hernia 10/27/2014  . Hypertension, benign 10/27/2014  . Female pattern hair loss 10/27/2014  . Nocturia 10/27/2014  . Left-sided Bell's palsy 10/27/2014  . Generalized anxiety disorder 10/27/2014  . Senile purpura (HCC) 10/27/2014    Orientation RESPIRATION BLADDER Height & Weight     Self, Time, Situation, Place  Normal Continent Weight: 63.1 kg Height:  5\' 7"  (170.2 cm)  BEHAVIORAL SYMPTOMS/MOOD NEUROLOGICAL BOWEL NUTRITION STATUS      Continent Diet  AMBULATORY STATUS COMMUNICATION OF NEEDS Skin   Extensive Assist Verbally Normal, Surgical wounds                       Personal Care Assistance Level of Assistance  Bathing, Dressing Bathing Assistance: Limited assistance   Dressing Assistance: Limited assistance     Functional Limitations Info  Sight, Hearing, Speech Sight Info: Adequate Hearing Info: Adequate Speech Info: Adequate    SPECIAL CARE FACTORS FREQUENCY  PT (By licensed PT)     PT Frequency: 5 times per week              Contractures Contractures Info: Not present    Additional Factors Info  Code Status, Allergies Code Status Info: full Allergies  Info: nkda           Current Medications (08/21/2018):  This is the current hospital active medication list Current Facility-Administered Medications  Medication Dose Route Frequency Provider Last Rate Last Dose  . 0.9 %  sodium chloride infusion (Manually program via Guardrails IV Fluids)   Intravenous Once Kennedy BuckerMenz, Michael, MD      . acetaminophen (TYLENOL) tablet 650 mg  650 mg Oral Q6H PRN Arnaldo Nataliamond, Michael S, MD   650 mg at 08/21/18 0022   Or  . acetaminophen (TYLENOL) suppository 650 mg  650 mg Rectal Q6H PRN Arnaldo Nataliamond, Michael S, MD      . docusate sodium (COLACE) capsule 100 mg  100 mg Oral BID Arnaldo Nataliamond, Michael S, MD   100 mg at 08/21/18 [id number]  . enoxaparin (LOVENOX) injection 40 mg  40 mg Subcutaneous Q24H Nappo, Osvaldo HumanKyle E, MD   40 mg at 08/21/18 [id number]  . morphine 2 MG/ML injection 2 mg  2 mg Intravenous Q4H PRN Arnaldo Nataliamond, Michael S, MD   2 mg at 08/19/18 1052  . ondansetron (ZOFRAN) tablet 4 mg  4 mg Oral Q6H PRN Arnaldo Nataliamond, Michael S, MD       Or  . ondansetron [facility](ZOFRAN) injection 4 mg  4 mg Intravenous Q6H PRN Arnaldo Nataliamond, Michael S, MD      . oxyCODONE-acetaminophen (PERCOCET/ROXICET) 5-325 MG per tablet 1 tablet  1 tablet Oral Q6H PRN Arnaldo Nataliamond, Michael S, MD   1 tablet at  08/21/18 0831     Discharge Medications: Please see discharge summary for a list of discharge medications.  Relevant Imaging Results:  Relevant Lab Results:   Additional Information [id number]  Su Hilt, RN

## 2018-08-21 NOTE — Progress Notes (Signed)
[facility] NAME: Sandra [name]    MR#:  [id number]  DATE OF BIRTH:  29-Jul-1940  SUBJECTIVE:   Patient states she is feeling okay this morning.  She did have some fatigue when working with physical therapy yesterday.  She denies any chest pain, shortness of breath, palpitations.  She has not noticed any bleeding.  REVIEW OF SYSTEMS:  CONSTITUTIONAL: No fever, fatigue or weakness.  EYES: No blurred or double vision.  EARS, NOSE, AND THROAT: No tinnitus or ear pain.  RESPIRATORY: No cough, shortness of breath, wheezing or hemoptysis.  CARDIOVASCULAR: No chest pain, orthopnea, edema.  GASTROINTESTINAL: No nausea, vomiting, diarrhea or abdominal pain.  GENITOURINARY: No dysuria, hematuria.  ENDOCRINE: No polyuria, nocturia,  HEMATOLOGY: No anemia, easy bruising or bleeding SKIN: No rash or lesion. MUSCULOSKELETAL: +Right hip pain NEUROLOGIC: No tingling, numbness, weakness.  PSYCHIATRY: No anxiety or depression.   DRUG ALLERGIES:  No Known Allergies  VITALS:  Blood pressure 127/63, pulse 87, temperature 99.1 F (37.3 C), temperature source Oral, resp. rate 20, height 5\' 7"  (1.702 m), weight 63.1 kg, SpO2 100 %.  PHYSICAL EXAMINATION:  GENERAL:  78 y.o.-year-old patient lying in the bed with no acute distress.  EYES: Pupils equal, round, reactive to light and accommodation. No scleral icterus. Extraocular muscles intact. + Conjunctival pallor HEENT: Head atraumatic, normocephalic. Oropharynx and nasopharynx clear.  NECK:  Supple, no jugular venous distention. No thyroid enlargement, no tenderness.  LUNGS: Normal breath sounds bilaterally, no wheezing, rales,rhonchi or crepitation. No use of accessory muscles of respiration.  CARDIOVASCULAR: RRR, S1, S2 normal. No murmurs, rubs, or gallops.  ABDOMEN: Soft, nontender, nondistended. Bowel sounds present.  EXTREMITIES: no pedal edema, cyanosis, or clubbing. + Dry bandage in  place over right lateral hip. NEUROLOGIC: Cranial nerves II through XII are intact. Sensation intact. Gait not checked.  PSYCHIATRIC: The patient is alert and oriented x 3.  SKIN: No obvious rash, lesion, or ulcer.    LABORATORY PANEL:   CBC Recent Labs  Lab 08/21/18 0403  WBC 7.8  HGB 6.3*  HCT 19.0*  PLT 128*   ------------------------------------------------------------------------------------------------------------------  Chemistries  Recent Labs  Lab 08/19/18 0506 08/20/18 0939  NA 132* 129*  K 4.1 3.8  CL 101 102  CO2 24 18*  GLUCOSE 119* 119*  BUN 14 13  CREATININE 0.80 0.82  CALCIUM 8.9 7.6*  AST 32  --   ALT 23  --   ALKPHOS 68  --   BILITOT 0.6  --    ------------------------------------------------------------------------------------------------------------------  Cardiac Enzymes No results for input(s): TROPONINI in the last 168 hours. ------------------------------------------------------------------------------------------------------------------  RADIOLOGY:  Dg Hip Operative Unilat W Or W/o Pelvis Right  Result Date: 08/19/2018 CLINICAL DATA:  Right hip ORIF EXAM: OPERATIVE RIGHT HIP (WITH PELVIS IF PERFORMED) 5 VIEWS TECHNIQUE: Fluoroscopic spot image(s) were submitted for interpretation post-operatively. COMPARISON:  None. FINDINGS: Five fluoroscopic images were obtained during open reduction internal fixation of a right femoral fracture. Fluoroscopy time was reported as 1 minutes 10 seconds. IMPRESSION: Intraoperative fluoroscopy. Electronically Signed   By: Ulyses Jarred M.D.   On: 08/19/2018 21:43   Dg Femur [place] 2 Views Right  Result Date: 08/19/2018 CLINICAL DATA:  Status post ORIF proximal right femur fracture. EXAM: RIGHT FEMUR PORTABLE 2 VIEW COMPARISON:  None. FINDINGS: IM nail and hip screw are identified status post open reduction and internal fixation of comminuted fracture deformity involving the proximal right femur. The hardware  components  and fracture fragments are in near anatomic alignment. No complicating features identified. Gas is identified within the soft tissues overlying the right hip. IMPRESSION: 1. Status post ORIF of proximal right femur fracture. Electronically Signed   By: Signa Kellaylor  Stroud M.D.   On: 08/19/2018 22:00    EKG:   Orders placed or performed during the hospital encounter of 08/19/18  . ED EKG  . ED EKG    ASSESSMENT AND PLAN:   Right hip fracture s/p repair on 8/2 -Ortho following- recommend WBAT, Lovenox x 2 weeks, follow-up with orthopedic surgery in 2 weeks -Pain control -PT recommending SNF  Acute blood loss anemia- per ortho, patient had a lot of blood loss in the OR.  She is not having any continued active bleeding.  Hemoglobin dropped from 11.7 > 7.4 > 6.3. -Discussed stopping Lovenox with Dr. Franco ColletNappo- he recommended continuing Lovenox and transfusing as needed -Patient ordered for 2 units PRBC today -Anemia panel unremarkable  Hypertension- BP normal -Holding home BP meds  Hyponatremia- worsening -Continue IV fluids   All the records are reviewed and case discussed with Care Management/Social Workerr. Management plans discussed with the patient, she is  in agreement.  CODE STATUS: Full  TOTAL TIME TAKING CARE OF THIS PATIENT: 38 minutes.   POSSIBLE D/C IN 1-2 DAYS, DEPENDING ON CLINICAL CONDITION.  Note: This dictation was prepared with Dragon dictation along with smaller phrase technology. Any transcriptional errors that result from this process are unintentional.   Hilton SinclairKaty D Adair Lemar M.D on 08/21/2018 at 12:39 PM  Between 7am to 6pm - Pager - [phone]  After 6pm go to [email or web address] - password [email or web address]  [facility]  Office  [phone]  CC: Primary care physician; Alba CorySowles, Krichna, MD

## 2018-08-21 NOTE — TOC Progression Note (Addendum)
Transition of Care (TOC) - Progression Note    Patient Details  Name: Inza P Tritz MRN: [id number] Date of Birth: 06/02/1940  Transition of Care (TOC) CM/SW Contact  Deliliah J Gregory, RN Phone Number: 08/21/2018, 3:01 PM  Clinical Narrative:     Met with the patient and her daughter to review the bed offers in detail, they accepted the bed offer from [facility].  I notified Leslie. [employer] has provided auth approval.  Spoke with Amy at [employer], ref number [id number] Start of care tomorrow auth approval 3 day review 8/7 auth id [id number] continued stay review fax to [phone].       Expected Discharge Plan and Services                                                 Social Determinants of Health (SDOH) Interventions    Readmission Risk Interventions No flowsheet data found.  

## 2018-08-21 NOTE — Progress Notes (Signed)
PT Cancellation Note  Patient Details Name: Sandra [name] MRN: [id number] DOB: July 19, 1940   Cancelled Treatment:    Reason Eval/Treat Not Completed: Patient not medically ready.  Pt's Hgb 6.3 with pt receiving transfusion this AM.  Spoke to nursing and will hold PT services this AM and attempt to see pt in the PM as medically appropriate.     Linus Salmons PT, DPT 08/21/18, 9:06 AM

## 2018-08-21 NOTE — TOC Progression Note (Signed)
Transition of Care [facility]) - Progression Note    Patient Details  Name: Sandra [name] MRN: [id number] Date of Birth: February 19, 1940  Transition of [facility]) CM/SW [place], RN Phone Number: 08/21/2018, 11:47 AM  Clinical Narrative:    Spoke with April from [facility] is approved with a start date of 8/5 need to know what facility, will call [employer] once patient decides        Expected Discharge Plan and Services                                                 Social Determinants of Health (SDOH) Interventions    Readmission Risk Interventions No flowsheet data found.

## 2018-08-21 NOTE — Progress Notes (Signed)
Pt. Hgb 6.3. notified Dr. Rudene Christians. Ordered 2 units RBC. type and screen done 8/2. Notified lab to prepare blood. Attempted to consent patient she did not fully understand and wanted to me to discuss the blood transfusion with her daughter Olin Hauser. I attempted to call daughter but no response. Will try again.

## 2018-08-21 NOTE — Progress Notes (Signed)
   Subjective: 2 Days Post-Op Procedure(s) (LRB): INTRAMEDULLARY (IM) NAIL INTERTROCHANTRIC (Right) Patient reports pain as mild.   Patient is well, and has had no acute complaints or problems Patient was not able to do much with therapy.  Took only 1-2 steps. Plan is to go Rehab after hospital stay. no nausea and no vomiting Patient denies any chest pains or shortness of breath. Patient appears to be resting well with no apparent discomfort Appeared to be more alert today.  Objective: Vital signs in last 24 hours: Temp:  [98.6 F (37 C)-100.1 F (37.8 C)] 99 F (37.2 C) (08/04 0122) Pulse Rate:  [74-84] 84 (08/03 2342) Resp:  [15-18] 15 (08/03 2342) BP: (112-124)/(51-63) 123/60 (08/03 2342) SpO2:  [100 %] 100 % (08/03 1524) well approximated incision Heels are non tender and elevated off the bed using rolled towels Intake/Output from previous day: 08/03 0701 - 08/04 0700 In: 1359.2 [P.O.:480; I.V.:779.2; IV Piggyback:100] Out: 1050 [Urine:1050] Intake/Output this shift: Total I/O In: 853.4 [P.O.:240; I.V.:613.4] Out: 1050 [Urine:1050]  Recent Labs    08/19/18 0506 08/20/18 0939 08/20/18 1838 08/21/18 0403  HGB 11.7* 7.4* 6.9* 6.3*   Recent Labs    08/20/18 0939 08/20/18 1838 08/21/18 0403  WBC 6.0  --  7.8  RBC 2.34*  --  2.04*  HCT 21.5* 20.0* 19.0*  PLT 127*  --  128*   Recent Labs    08/19/18 0506 08/20/18 0939  NA 132* 129*  K 4.1 3.8  CL 101 102  CO2 24 18*  BUN 14 13  CREATININE 0.80 0.82  GLUCOSE 119* 119*  CALCIUM 8.9 7.6*   Recent Labs    08/19/18 0506  INR 1.0    EXAM General - Patient is Alert, Appropriate and Oriented Extremity - Neurologically intact Neurovascular intact Sensation intact distally Intact pulses distally Dorsiflexion/Plantar flexion intact No cellulitis present Swelling proximal right thigh but does not appear to be extremely tight Dressing - scant drainage Motor Function - intact, moving foot and toes well  on exam.    Past Medical History:  Diagnosis Date  . Anxiety   . Bell's palsy   . Changes in skin texture   . Hair loss   . Hiatal hernia   . Hypertension   . Murmur, cardiac   . Nocturia   . Ovarian failure     Assessment/Plan: 2 Days Post-Op Procedure(s) (LRB): INTRAMEDULLARY (IM) NAIL INTERTROCHANTRIC (Right) Active Problems:   Hip fracture (HCC)  Estimated body mass index is 21.79 kg/m as calculated from the following:   Height as of this encounter: 5\' 7"  (1.702 m).   Weight as of this encounter: 63.1 kg. Up with therapy Plan for discharge tomorrow Discharge to SNF  Labs: Hemoglobin today of 6.3.  Documents has been contacted and order has been placed for 2 units of packed RBCs.  Sodium yesterday 129 DVT Prophylaxis - Lovenox and SCDs bilaterally but does not have TED stockings.  Will apply TED stockings to both legs Weight-Bearing as tolerated to right leg Patient needs a bowel movement Continue Lovenox injections during milligrams twice daily when patient is discharged to rehab for 2 weeks We will need to follow-up in clinical clinic in 2 weeks for staple removal Continue TED stockings bilaterally  Carli Lefevers R. [facility] PA [place] 08/21/2018, 6:56 AM

## 2018-08-21 NOTE — Progress Notes (Signed)
Physical Therapy Treatment Patient Details Name: Sandra [name] MRN: [id number] DOB: 06/19/1940 Today's Date: 08/21/2018    History of Present Illness From MD H&P: The pt is an 78 yo female with past medical history of hypertension presents to the emergency department complaining of right hip pain.  The patient tripped and fell and immediately felt pain in the right leg and hip that was exacerbated by movement.  X-ray revealed a severely comminuted fracture of the right proximal femur.  Pt is now s/p right pertrochanteric femur fracture closed reduction and long cephalomedullary nail.    PT Comments    Pt presents with deficits in strength, transfers, mobility, gait, balance, and activity tolerance.  Pt required Mod A with sit to/from stand transfers from various height surfaces along with cueing for hand placement and increased trunk flexion.  Pt's BP taken in standing this session and was WNL with no adverse symptoms reported.  Pt was able to amb 2 feet with a RW and mod A including requiring physical assistance to advance her RLE.  Pt required heavy verbal and tactile cues for proper sequencing during amb as well as physical assistance for walker management.  Step-to gait pattern education provided for RLE pain relief but pt ultimately was highly limited by pain during amb and required therapeutic seated rest breaks between amb attempts.  Pt will benefit from PT services in a SNF setting upon discharge to safely address above deficits for decreased caregiver assistance and eventual return to PLOF.      Follow Up Recommendations  SNF;Supervision for mobility/OOB     Equipment Recommendations  Other (comment)(TBD at next venue of care)    Recommendations for Other Services       Precautions / Restrictions Precautions Precautions: Fall Restrictions Weight Bearing Restrictions: Yes RLE Weight Bearing: Weight bearing as tolerated    Mobility  Bed Mobility               General  bed mobility comments: NT,pt in recliner  Transfers Overall transfer level: Needs assistance Equipment used: Rolling walker (2 wheeled) Transfers: Sit to/from Stand Sit to Stand: Mod assist         General transfer comment: Mod [facility] and recliner with mod verbal and tactile cues for hand placement and increased trunk flexion  Ambulation/Gait Ambulation/Gait assistance: Mod assist Gait Distance (Feet): 2 Feet x 3 Assistive device: Rolling walker (2 wheeled) Gait Pattern/deviations: Step-to pattern;Antalgic;Trunk flexed Gait velocity: decreased   General Gait Details: Physical assistance required to advance pt's RLE and for walker management   Stairs             Wheelchair Mobility    Modified Rankin (Stroke Patients Only)       Balance Overall balance assessment: Needs assistance   Sitting balance-Leahy Scale: Good     Standing balance support: Bilateral upper extremity supported Standing balance-Leahy Scale: Fair Standing balance comment: Heavy lean on the RW in standing                            Cognition Arousal/Alertness: Awake/alert Behavior During Therapy: WFL for tasks assessed/performed Overall Cognitive Status: Within Functional Limits for tasks assessed                                        Exercises Total Joint Exercises Ankle Circles/Pumps: Strengthening;Both;10  reps;15 reps Quad Sets: Strengthening;Both;5 reps;10 reps Towel Squeeze: Strengthening;Both;5 reps;10 reps Hip ABduction/ADduction: AAROM;Both;5 reps Straight Leg Raises: AAROM;Both;5 reps Long Arc Quad: Strengthening;Both;10 reps;15 reps Knee Flexion: Strengthening;Both;10 reps;15 reps Other Exercises Other Exercises: Anterior weight shifting activities in sitting to facilitate transfers Other Exercises: Pt and daughter's questions/concerns addressed regarding prognosis and progression expectations    General Comments         Pertinent Vitals/Pain Pain Assessment: 0-10 Pain Score: 5  Pain Location: R hip Pain Descriptors / Indicators: Aching;Sore Pain Intervention(s): Monitored during session;Limited activity within patient's tolerance;Patient requesting pain meds-RN notified    Home Living                      Prior Function            PT Goals (current goals can now be found in the care plan section) Progress towards PT goals: Progressing toward goals    Frequency    BID      PT Plan Current plan remains appropriate    Co-evaluation              AM-PAC PT "6 Clicks" Mobility   Outcome Measure  Help needed turning from your back to your side while in a flat bed without using bedrails?: A Lot Help needed moving from lying on your back to sitting on the side of a flat bed without using bedrails?: A Lot Help needed moving to and from a bed to a chair (including a wheelchair)?: A Lot Help needed standing up from a chair using your arms (e.g., wheelchair or bedside chair)?: A Lot Help needed to walk in hospital room?: Total Help needed climbing 3-5 steps with a railing? : Total 6 Click Score: 10    End of Session Equipment Utilized During Treatment: Gait belt Activity Tolerance: Patient tolerated treatment well Patient left: in chair;with chair alarm set;with call bell/phone within reach;with family/visitor present Nurse Communication: Mobility status PT Visit Diagnosis: Unsteadiness on feet (R26.81);Other abnormalities of gait and mobility (R26.89);Muscle weakness (generalized) (M62.81)     Time: 5102-5852 PT Time Calculation (min) (ACUTE ONLY): 41 min  Charges:  $Gait Training: 8-22 mins $Therapeutic Exercise: 8-22 mins $Therapeutic Activity: 8-22 mins                     D. Scott Nichola Warren PT, DPT 08/21/18, 4:09 PM

## 2018-08-22 ENCOUNTER — Encounter: Payer: Self-pay | Admitting: Orthopaedic Surgery

## 2018-08-22 LAB — BASIC METABOLIC PANEL
Anion gap: 5 (ref 5–15)
BUN: 19 mg/dL (ref 8–23)
CO2: 22 mmol/L (ref 22–32)
Calcium: 7.5 mg/dL — ABNORMAL LOW (ref 8.9–10.3)
Chloride: 105 mmol/L (ref 98–111)
Creatinine, Ser: 0.7 mg/dL (ref 0.44–1.00)
GFR calc Af Amer: >60 mL/min (ref >60)
GFR calc non Af Amer: >60 mL/min (ref >60)
Glucose, Bld: 104 mg/dL — ABNORMAL HIGH (ref 70–99)
Potassium: 4 mmol/L (ref 3.5–5.1)
Sodium: 132 mmol/L — ABNORMAL LOW (ref 135–145)

## 2018-08-22 LAB — CBC
HCT: 20.3 % — ABNORMAL LOW (ref 36.0–46.0)
HCT: 23.5 % — ABNORMAL LOW (ref 36.0–46.0)
Hemoglobin: 7.1 g/dL — ABNORMAL LOW (ref 12.0–15.0)
Hemoglobin: 8.1 g/dL — ABNORMAL LOW (ref 12.0–15.0)
MCH: 31.6 pg (ref 26.0–34.0)
MCH: 31.4 pg (ref 26.0–34.0)
MCHC: 35 g/dL (ref 30.0–36.0)
MCHC: 34.5 g/dL (ref 30.0–36.0)
MCV: 90.2 fL (ref 80.0–100.0)
MCV: 91.1 fL (ref 80.0–100.0)
Platelets: 140 10*3/uL — ABNORMAL LOW (ref 150–400)
Platelets: 160 10*3/uL (ref 150–400)
RBC: 2.25 MIL/uL — ABNORMAL LOW (ref 3.87–5.11)
RBC: 2.58 MIL/uL — ABNORMAL LOW (ref 3.87–5.11)
RDW: 13 % (ref 11.5–15.5)
RDW: 13.1 % (ref 11.5–15.5)
WBC: 10.1 10*3/uL (ref 4.0–10.5)
WBC: 10.7 10*3/uL — ABNORMAL HIGH (ref 4.0–10.5)
nRBC: 0 % (ref 0.0–0.2)
nRBC: 0 % (ref 0.0–0.2)

## 2018-08-22 LAB — TYPE AND SCREEN
ABO/RH(D): A POS
Antibody Screen: NEGATIVE
Unit division: 0

## 2018-08-22 LAB — BPAM RBC
Blood Product Expiration Date: 202008252359
ISSUE DATE / TIME: 202008040740
Unit Type and Rh: 6200

## 2018-08-22 NOTE — Progress Notes (Signed)
Physical Therapy Treatment Patient Details Name: Sandra [name] Roddy MRN: [id number] DOB: 10/20/1940 Today's Date: 08/22/2018    History of Present Illness From MD H&[name]: The pt is an 78 yo female with past medical history of hypertension presents to the emergency department complaining of right hip pain.  The patient tripped and fell and immediately felt pain in the right leg and hip that was exacerbated by movement.  X-ray revealed a severely comminuted fracture of the right proximal femur.  Pt is now s/[name] right pertrochanteric femur fracture closed reduction and long cephalomedullary nail.    PT Comments    Pt in bed but agrees to further mobility.  To edge of bed with mod a x 1.  Once sitting supervision for safety.  Stood to walker with min a for exercises below.  Limited by fatigue.  Returned to supine with mod a x 1.     Follow Up Recommendations  SNF;Supervision for mobility/OOB     Equipment Recommendations  Rolling walker with 5" wheels;3in1 (PT)    Recommendations for Other Services       Precautions / Restrictions Precautions Precautions: Fall Restrictions Weight Bearing Restrictions: Yes RLE Weight Bearing: Weight bearing as tolerated    Mobility  Bed Mobility Overal bed mobility: Needs Assistance Bed Mobility: Supine to Sit;Sit to Supine     Supine to sit: Mod assist Sit to supine: Mod assist   General bed mobility comments: Mod verbal and tactile cues for sequencing  Transfers Overall transfer level: Needs assistance Equipment used: Rolling walker (2 wheeled) Transfers: Sit to/from Stand Sit to Stand: Min assist;From elevated surface         General transfer comment: Min verbal cues for sequencing  Ambulation/Gait Ambulation/Gait assistance: Min assist Gait Distance (Feet): 4 Feet Assistive device: Rolling walker (2 wheeled) Gait Pattern/deviations: Step-to pattern;Antalgic;Trunk flexed Gait velocity: decreased   General Gait Details: declined gait.   session focused on standing exercises and balance.   Stairs             Wheelchair Mobility    Modified Rankin (Stroke Patients Only)       Balance Overall balance assessment: Needs assistance   Sitting balance-Leahy Scale: Good     Standing balance support: Bilateral upper extremity supported Standing balance-Leahy Scale: Fair Standing balance comment: Heavy lean on the RW in standing                            Cognition Arousal/Alertness: Awake/alert Behavior During Therapy: WFL for tasks assessed/performed Overall Cognitive Status: Within Functional Limits for tasks assessed                                        Exercises Total Joint Exercises Ankle Circles/Pumps: Strengthening;Both;10 reps;15 reps Quad Sets: Strengthening;Both;5 reps;10 reps Towel Squeeze: Strengthening;Both;5 reps;10 reps Heel Slides: AAROM;AROM;Both;5 reps Long Arc Quad: Strengthening;Both;10 reps;15 reps Knee Flexion: Strengthening;Both;10 reps;15 reps Other Exercises Other Exercises: standing R marches, SLR in standing, LAQ in sitting    General Comments        Pertinent Vitals/Pain Pain Assessment: Faces Pain Score: 4  Faces Pain Scale: Hurts little more Pain Location: R hip Pain Descriptors / Indicators: Aching;Sore Pain Intervention(s): Limited activity within patient's tolerance;Monitored during session;Repositioned    Home Living  Prior Function            PT Goals (current goals can now be found in the care plan section) Progress towards PT goals: Progressing toward goals    Frequency    BID      PT Plan Current plan remains appropriate    Co-evaluation              AM-PAC PT "6 Clicks" Mobility   Outcome Measure  Help needed turning from your back to your side while in a flat bed without using bedrails?: A Lot Help needed moving from lying on your back to sitting on the side of a flat bed  without using bedrails?: A Lot Help needed moving to and from a bed to a chair (including a wheelchair)?: A Lot Help needed standing up from a chair using your arms (e.g., wheelchair or bedside chair)?: A Lot Help needed to walk in hospital room?: Total Help needed climbing 3-5 steps with a railing? : Total 6 Click Score: 10    End of Session Equipment Utilized During Treatment: Gait belt Activity Tolerance: Patient tolerated treatment well Patient left: in bed;with call bell/phone within reach;with bed alarm set;with SCD's reapplied;Other (comment);with family/visitor present Nurse Communication: Mobility status PT Visit Diagnosis: Unsteadiness on feet (R26.81);Other abnormalities of gait and mobility (R26.89);Muscle weakness (generalized) (M62.81)     Time: 9476-5465 PT Time Calculation (min) (ACUTE ONLY): 13 min  Charges:  $Therapeutic Exercise: 8-22 mins $Therapeutic Activity: 8-22 mins                     Chesley Noon, PTA 08/22/18, 4:03 PM

## 2018-08-22 NOTE — Progress Notes (Signed)
Physical Therapy Treatment Patient Details Name: Sandra [name] MRN: [id number] DOB: 09-07-1940 Today's Date: 08/22/2018    History of Present Illness From MD H&P: The pt is an 78 yo female with past medical history of hypertension presents to the emergency department complaining of right hip pain.  The patient tripped and fell and immediately felt pain in the right leg and hip that was exacerbated by movement.  X-ray revealed a severely comminuted fracture of the right proximal femur.  Pt is now s/p right pertrochanteric femur fracture closed reduction and long cephalomedullary nail.    PT Comments    Pt presents with deficits in strength, transfers, mobility, gait, balance, and activity tolerance but is slowly making progress towards goals.  Pt required mod A with bed mobility tasks for BLEs in/out of bed and for trunk to full upright position.  Pt required min A to stand from from an elevated EOB with cues for hand placement and increased trunk flexion.  Pt was able to amb 4' with a RW with slow, effortful cadence and short B step length but demonstrated improved foot clearance while advancing BLEs.  Pt will benefit from PT services in a SNF setting upon discharge to safely address above deficits for decreased caregiver assistance and eventual return to PLOF.     Follow Up Recommendations  SNF;Supervision for mobility/OOB     Equipment Recommendations  Other (comment)(TBD at next venue of care)    Recommendations for Other Services       Precautions / Restrictions Precautions Precautions: Fall Restrictions Weight Bearing Restrictions: Yes RLE Weight Bearing: Weight bearing as tolerated    Mobility  Bed Mobility Overal bed mobility: Needs Assistance Bed Mobility: Supine to Sit;Sit to Supine     Supine to sit: Mod assist Sit to supine: Mod assist   General bed mobility comments: Mod verbal and tactile cues for sequencing  Transfers Overall transfer level: Needs  assistance Equipment used: Rolling walker (2 wheeled) Transfers: Sit to/from Stand Sit to Stand: Min assist;From elevated surface         General transfer comment: Min verbal cues for sequencing  Ambulation/Gait Ambulation/Gait assistance: Min assist Gait Distance (Feet): 4 Feet Assistive device: Rolling walker (2 wheeled) Gait Pattern/deviations: Step-to pattern;Antalgic;Trunk flexed Gait velocity: decreased   General Gait Details: Improved clearance while advancing BLEs this session but steps remain effortful with short step length and antalgic on the RLE   Stairs             Wheelchair Mobility    Modified Rankin (Stroke Patients Only)       Balance Overall balance assessment: Needs assistance   Sitting balance-Leahy Scale: Good     Standing balance support: Bilateral upper extremity supported Standing balance-Leahy Scale: Fair Standing balance comment: Heavy lean on the RW in standing                            Cognition Arousal/Alertness: Awake/alert Behavior During Therapy: WFL for tasks assessed/performed Overall Cognitive Status: Within Functional Limits for tasks assessed                                        Exercises Total Joint Exercises Ankle Circles/Pumps: Strengthening;Both;10 reps;15 reps Quad Sets: Strengthening;Both;5 reps;10 reps Towel Squeeze: Strengthening;Both;5 reps;10 reps Heel Slides: AAROM;AROM;Both;5 reps Long Arc Quad: Strengthening;Both;10 reps;15 reps Knee Flexion: Strengthening;Both;10 reps;15  reps    General Comments        Pertinent Vitals/Pain Pain Assessment: 0-10 Pain Score: 4  Pain Location: R hip Pain Descriptors / Indicators: Aching;Sore Pain Intervention(s): Premedicated before session;Monitored during session    Home Living                      Prior Function            PT Goals (current goals can now be found in the care plan section) Progress towards PT  goals: Progressing toward goals    Frequency    BID      PT Plan Current plan remains appropriate    Co-evaluation              AM-PAC PT "6 Clicks" Mobility   Outcome Measure  Help needed turning from your back to your side while in a flat bed without using bedrails?: A Lot Help needed moving from lying on your back to sitting on the side of a flat bed without using bedrails?: A Lot Help needed moving to and from a bed to a chair (including a wheelchair)?: A Lot Help needed standing up from a chair using your arms (e.g., wheelchair or bedside chair)?: A Lot Help needed to walk in hospital room?: Total Help needed climbing 3-5 steps with a railing? : Total 6 Click Score: 10    End of Session Equipment Utilized During Treatment: Gait belt Activity Tolerance: Patient tolerated treatment well Patient left: in bed;with call bell/phone within reach;with bed alarm set;with SCD's reapplied;Other (comment)(Pt declined up in chair) Nurse Communication: Mobility status PT Visit Diagnosis: Unsteadiness on feet (R26.81);Other abnormalities of gait and mobility (R26.89);Muscle weakness (generalized) (M62.81)     Time: [id number] PT Time Calculation (min) (ACUTE ONLY): 24 min  Charges:  $Therapeutic Exercise: 8-22 mins $Therapeutic Activity: 8-22 mins                     D. Scott Timofey Carandang PT, DPT 08/22/18, 1:40 PM

## 2018-08-22 NOTE — Progress Notes (Signed)
   Subjective: 3 Days Post-Op Procedure(s) (LRB): INTRAMEDULLARY (IM) NAIL INTERTROCHANTRIC (Right) Patient reports pain as mild.   Patient is well, and has had no acute complaints or problems Patient still having difficulty with therapy and was only able to ambulate couple feet. Plan is to go Rehab after hospital stay. no nausea and no vomiting Patient denies any chest pains or shortness of breath. Patient still requesting to stay another day before going to rehab Still seems a little confused at times  Objective: Vital signs in last 24 hours: Temp:  [98.2 F (36.8 C)-99.1 F (37.3 C)] 98.9 F (37.2 C) (08/05 0110) Pulse Rate:  [87-109] 88 (08/05 0110) Resp:  [16-20] 16 (08/05 0110) BP: (121-138)/(52-71) 126/54 (08/05 0110) SpO2:  [94 %-100 %] 100 % (08/05 0110) well approximated incision Heels are non tender and elevated off the bed using rolled towels Intake/Output from previous day: 08/04 0701 - 08/05 0700 In: 360 [P.O.:360] Out: -  Intake/Output this shift: No intake/output data recorded.  Recent Labs    08/20/18 0939 08/20/18 1838 08/21/18 0403 08/21/18 1259 08/22/18 0346  HGB 7.4* 6.9* 6.3* 9.1* 7.1*   Recent Labs    08/21/18 0403 08/21/18 1259 08/22/18 0346  WBC 7.8  --  10.1  RBC 2.04*  --  2.25*  HCT 19.0* 27.0* 20.3*  PLT 128*  --  140*   Recent Labs    08/20/18 0939 08/22/18 0346  NA 129* 132*  K 3.8 4.0  CL 102 105  CO2 18* 22  BUN 13 19  CREATININE 0.82 0.70  GLUCOSE 119* 104*  CALCIUM 7.6* 7.5*   No results for input(s): LABPT, INR in the last 72 hours.  EXAM General - Patient is Alert, Appropriate and Oriented Extremity - Neurologically intact Neurovascular intact Sensation intact distally Intact pulses distally Dorsiflexion/Plantar flexion intact No cellulitis present Compartment soft Dressing - scant drainage Motor Function - intact, moving foot and toes well on exam.    Past Medical History:  Diagnosis Date  . Anxiety    . Bell's palsy   . Changes in skin texture   . Hair loss   . Hiatal hernia   . Hypertension   . Murmur, cardiac   . Nocturia   . Ovarian failure     Assessment/Plan: 3 Days Post-Op Procedure(s) (LRB): INTRAMEDULLARY (IM) NAIL INTERTROCHANTRIC (Right) Active Problems:   Hip fracture (HCC)  Estimated body mass index is 21.79 kg/m as calculated from the following:   Height as of this encounter: 5\' 7"  (1.702 m).   Weight as of this encounter: 63.1 kg. Up with therapy Discharge to SNF when medically cleared  Labs: Hemoglobin 7.1 up from 6.3.  Vital signs are stable and she appears to be asymptomatic. DVT Prophylaxis - Lovenox, TED hose and SCDs Weight-Bearing as tolerated to right leg Patient needs a bowel movement Continue Lovenox injections during milligrams twice daily when patient is discharged to rehab for 2 weeks We will need to follow-up in clinical clinic in 2 weeks for staple removal Continue TED stockings bilaterally  Jon R. [place] Maitland 08/22/2018, 7:15 AM

## 2018-08-22 NOTE — Progress Notes (Signed)
[facility] - [place] at [facility]   PATIENT NAME: Sandra BaconVirginia [name]    MR#:  [id number]  DATE OF BIRTH:  01/30/1940  SUBJECTIVE:   Status post 1 unit of blood transfusion, patient denies any complaints.  No dizziness.  REVIEW OF SYSTEMS:  CONSTITUTIONAL: No fever, fatigue or weakness.  EYES: No blurred or double vision.  EARS, NOSE, AND THROAT: No tinnitus or ear pain.  RESPIRATORY: No cough, shortness of breath, wheezing or hemoptysis.  CARDIOVASCULAR: No chest pain, orthopnea, edema.  GASTROINTESTINAL: No nausea, vomiting, diarrhea or abdominal pain.  GENITOURINARY: No dysuria, hematuria.  ENDOCRINE: No polyuria, nocturia,  HEMATOLOGY: No anemia, easy bruising or bleeding SKIN: No rash or lesion. MUSCULOSKELETAL: +Right hip pain NEUROLOGIC: No tingling, numbness, weakness.  PSYCHIATRY: No anxiety or depression.   DRUG ALLERGIES:  No Known Allergies  VITALS:  Blood pressure (!) 118/52, pulse 86, temperature 98.3 F (36.8 C), temperature source Oral, resp. rate 16, height 5\' 7"  (1.702 m), weight 63.1 kg, SpO2 99 %.  PHYSICAL EXAMINATION:  GENERAL:  78 y.o.-year-old patient lying in the bed with no acute distress.  EYES: Pupils equal, round, reactive to light and accommodation. No scleral icterus. Extraocular muscles intact. + Conjunctival pallor HEENT: Head atraumatic, normocephalic. Oropharynx and nasopharynx clear.  NECK:  Supple, no jugular venous distention. No thyroid enlargement, no tenderness.  LUNGS: Normal breath sounds bilaterally, no wheezing, rales,rhonchi or crepitation. No use of accessory muscles of respiration.  CARDIOVASCULAR: RRR, S1, S2 normal. No murmurs, rubs, or gallops.  ABDOMEN: Soft, nontender, nondistended. Bowel sounds present.  EXTREMITIES: no pedal edema, cyanosis, or clubbing. + Dry bandage in place over right lateral hip. NEUROLOGIC: Cranial nerves II through XII are intact. Sensation intact. Gait not checked.   PSYCHIATRIC: The patient is alert and oriented x 3.  SKIN: No obvious rash, lesion, or ulcer.    LABORATORY PANEL:   CBC Recent Labs  Lab 08/22/18 0938  WBC 10.7*  HGB 8.1*  HCT 23.5*  PLT 160   ------------------------------------------------------------------------------------------------------------------  Chemistries  Recent Labs  Lab 08/19/18 0506  08/22/18 0346  NA 132*   < > 132*  K 4.1   < > 4.0  CL 101   < > 105  CO2 24   < > 22  GLUCOSE 119*   < > 104*  BUN 14   < > 19  CREATININE 0.80   < > 0.70  CALCIUM 8.9   < > 7.5*  AST 32  --   --   ALT 23  --   --   ALKPHOS 68  --   --   BILITOT 0.6  --   --    < > = values in this interval not displayed.   ------------------------------------------------------------------------------------------------------------------  Cardiac Enzymes No results for input(s): TROPONINI in the last 168 hours. ------------------------------------------------------------------------------------------------------------------  RADIOLOGY:  No results found.  EKG:   Orders placed or performed during the hospital encounter of 08/19/18  . ED EKG  . ED EKG    ASSESSMENT AND PLAN:     Right hip fracture s/p repair on 8/2 -Ortho following- recommend WBAT, DVT prophylaxis x 6 weeks, follow-up with orthopedic surgery in 2 weeks -Pain control -PT recommending SNF  Acute blood loss anemia-, patient last blood during the operation, status post 1 unit of blood transfusion, hemoglobin improved from 6.3-9.1 but again dropped to 7.1 this morning, repeat hemoglobin again showed 8.1.  Patient had likely had spurious lab results of 7.1 today morning  which actually improved after repeat blood work. -   Hypertension-BP soft, -Holding home BP meds  Hyponatremia-improved.  Discontinue IV fluids, encourage p.o. intake. Leukocytosis- resolved.  Likely inflammatory response., Possible discharge tomorrow.  Would like to make sure her  hemoglobin is stable before discharging.  All the records are reviewed and case discussed with Care Management/Social Workerr. Management plans discussed with the patient, she is  in agreement.  CODE STATUS: Full  TOTAL TIME TAKING CARE OF THIS PATIENT: 40 minutes.   POSSIBLE D/C IN 1-2 DAYS, DEPENDING ON CLINICAL CONDITION.  Note: This dictation was prepared with Dragon dictation along with smaller phrase technology. Any transcriptional errors that result from this process are unintentional.   Epifanio Lesches M.D on 08/22/2018 at 11:44 AM  Between 7am to 6pm - Pager - [phone]  After 6pm go to [email or web address] - password [email or web address] [facility]  [place] [facility]  Office  [phone]  CC: Primary care physician; Steele Sizer, MD

## 2018-08-22 NOTE — Progress Notes (Signed)
MD notified of patient's most recent hgb count of 8.1.

## 2018-08-22 NOTE — Care Management Important Message (Signed)
Important Message  Patient Details  Name: Sandra [name] MRN: [id number] Date of Birth: 17-Feb-1940   Medicare Important Message Given:  Yes     [name] 08/22/2018, 10:47 AM

## 2018-08-23 LAB — CBC
HCT: 22.7 % — ABNORMAL LOW (ref 36.0–46.0)
Hemoglobin: 7.7 g/dL — ABNORMAL LOW (ref 12.0–15.0)
MCH: 31.4 pg (ref 26.0–34.0)
MCHC: 33.9 g/dL (ref 30.0–36.0)
MCV: 92.7 fL (ref 80.0–100.0)
Platelets: 218 10*3/uL (ref 150–400)
RBC: 2.45 MIL/uL — ABNORMAL LOW (ref 3.87–5.11)
RDW: 13.2 % (ref 11.5–15.5)
WBC: 10.1 10*3/uL (ref 4.0–10.5)
nRBC: 0 % (ref 0.0–0.2)

## 2018-08-23 LAB — VITAMIN B12: Vitamin B-12: 256 pg/mL (ref 180–914)

## 2018-08-23 LAB — IRON AND TIBC
Iron: 33 ug/dL (ref 28–170)
Saturation Ratios: 18 % (ref 10.4–31.8)
TIBC: 188 ug/dL — ABNORMAL LOW (ref 250–450)
UIBC: 155 ug/dL

## 2018-08-23 LAB — FOLATE: Folate: 14.1 ng/mL (ref >5.9)

## 2018-08-23 LAB — HEMOGLOBIN AND HEMATOCRIT, BLOOD
HCT: 20.2 % — ABNORMAL LOW (ref 36.0–46.0)
Hemoglobin: 6.8 g/dL — ABNORMAL LOW (ref 12.0–15.0)

## 2018-08-23 LAB — FERRITIN: Ferritin: 148 ng/mL (ref 11–307)

## 2018-08-23 MED ORDER — OXYCODONE-ACETAMINOPHEN 5-325 MG PO TABS: 1.0000 | ORAL_TABLET | Freq: Four times a day (QID) | ORAL | 0 refills | Status: DC | PRN

## 2018-08-23 MED ORDER — ENOXAPARIN SODIUM 40 MG/0.4ML ~~LOC~~ SOLN: 40.0000 mg | SUBCUTANEOUS | 0 refills | Status: DC

## 2018-08-23 MED ORDER — ALUM & MAG HYDROXIDE-SIMETH 200-200-20 MG/5ML PO SUSP
30.0000 mL | Freq: Four times a day (QID) | ORAL | Status: DC | PRN
  Administered 2018-08-23: 14:00:00 30 mL via ORAL
  Filled 2018-08-23: qty 30

## 2018-08-23 NOTE — TOC Progression Note (Signed)
Transition of Care [facility]) - Progression Note    Patient Details  Name: Sandra [name] MRN: [id number] Date of Birth: 25-Nov-1940  Transition of Care [facility]) CM/SW [place], RN Phone Number: 08/23/2018, 11:28 AM  Clinical Narrative:     [facility] and got the Start of care date pushed out until 8/7       Expected Discharge Plan and Services                                                 Social Determinants of Health (SDOH) Interventions    Readmission Risk Interventions No flowsheet data found.

## 2018-08-23 NOTE — Progress Notes (Signed)
[place] at [place] NAME: Sandra [name]    MR#:  [id number]  DATE OF BIRTH:  1940-10-20  SUBJECTIVE:   Patient daughter is in the room and is concerned about anemia.  Hemoglobin 7.7 today.  Never had a history of anemia, patient feels constipated and otherwise no complaints.  REVIEW OF SYSTEMS:  CONSTITUTIONAL: No fever, fatigue or weakness.  EYES: No blurred or double vision.  EARS, NOSE, AND THROAT: No tinnitus or ear pain.  RESPIRATORY: No cough, shortness of breath, wheezing or hemoptysis.  CARDIOVASCULAR: No chest pain, orthopnea, edema.  GASTROINTESTINAL: No nausea, vomiting, diarrhea or abdominal pain.  GENITOURINARY: No dysuria, hematuria.  ENDOCRINE: No polyuria, nocturia,  HEMATOLOGY: No anemia, easy bruising or bleeding SKIN: No rash or lesion. MUSCULOSKELETAL: +Right hip pain NEUROLOGIC: No tingling, numbness, weakness.  PSYCHIATRY: No anxiety or depression.   DRUG ALLERGIES:  No Known Allergies  VITALS:  Blood pressure (!) 127/56, pulse 87, temperature 98.9 F (37.2 C), temperature source Oral, resp. rate 16, height 5\' 7"  (1.702 m), weight 63.1 kg, SpO2 99 %.  PHYSICAL EXAMINATION:  GENERAL:  78 y.o.-year-old patient lying in the bed with no acute distress.  EYES: Pupils equal, round, reactive to light and accommodation. No scleral icterus. Extraocular muscles intact. + Conjunctival pallor HEENT: Head atraumatic, normocephalic. Oropharynx and nasopharynx clear.  NECK:  Supple, no jugular venous distention. No thyroid enlargement, no tenderness.  LUNGS: Normal breath sounds bilaterally, no wheezing, rales,rhonchi or crepitation. No use of accessory muscles of respiration.  CARDIOVASCULAR: RRR, S1, S2 normal. No murmurs, rubs, or gallops.  ABDOMEN: Soft, nontender, nondistended. Bowel sounds present.  EXTREMITIES: no pedal edema, cyanosis, or clubbing. + Dry bandage in place over right lateral hip. NEUROLOGIC:  Cranial nerves II through XII are intact. Sensation intact. Gait not checked.  PSYCHIATRIC: The patient is alert and oriented x 3.  SKIN: No obvious rash, lesion, or ulcer.    LABORATORY PANEL:   CBC Recent Labs  Lab 08/23/18 1058  WBC 10.1  HGB 7.7*  HCT 22.7*  PLT 218   ------------------------------------------------------------------------------------------------------------------  Chemistries  Recent Labs  Lab 08/19/18 0506  08/22/18 0346  NA 132*   < > 132*  K 4.1   < > 4.0  CL 101   < > 105  CO2 24   < > 22  GLUCOSE 119*   < > 104*  BUN 14   < > 19  CREATININE 0.80   < > 0.70  CALCIUM 8.9   < > 7.5*  AST 32  --   --   ALT 23  --   --   ALKPHOS 68  --   --   BILITOT 0.6  --   --    < > = values in this interval not displayed.   ------------------------------------------------------------------------------------------------------------------  Cardiac Enzymes No results for input(s): TROPONINI in the last 168 hours. ------------------------------------------------------------------------------------------------------------------  RADIOLOGY:  No results found.  EKG:   Orders placed or performed during the hospital encounter of 08/19/18  . ED EKG  . ED EKG    ASSESSMENT AND PLAN:     Right hip fracture s/p repair on 8/2 -Ortho following- recommend WBAT, DVT prophylaxis x 6 weeks, follow-up with orthopedic surgery in 2 weeks -Pain control -PT recommending SNF  Acute blood loss anemia-, patient last blood during the operation, status post 1 unit of blood transfusion, has anemia with hemoglobin of 7.7 this morning, ordered anemia panel including iron,  iron binding capacity, ferritin, folate.  Possible discharge tomorrow pending anemia panel, discussed the plan with patient's daughter.  Explained that patient lost a lot of blood during the operation and hopefully her anemia will be corrected soon.   Hypertension-BP soft, -Holding home BP  meds  Hyponatremia-improved.  Discontinue IV fluids, encourage p.o. intake. Leukocytosis- resolved.  Likely inflammatory response.,   All the records are reviewed and case discussed with Care Management/Social Workerr. Management plans discussed with the patient, she is  in agreement.  CODE STATUS: Full  TOTAL TIME TAKING CARE OF THIS PATIENT: 40 minutes.   POSSIBLE D/C IN 1-2 DAYS, DEPENDING ON CLINICAL CONDITION.  Note: This dictation was prepared with Dragon dictation along with smaller phrase technology. Any transcriptional errors that result from this process are unintentional.   Katha HammingSnehalatha Nazir Hacker M.D on 08/23/2018 at 1:41 PM  Between 7am to 6pm - Pager - [phone]  After 6pm go to [email or web address] - password [email or web address] [email or web address]  [facility]  Office  [phone]  CC: Primary care physician; Alba CorySowles, Krichna, MD

## 2018-08-23 NOTE — Progress Notes (Signed)
Physical Therapy Treatment Patient Details Name: Sandra ClampVirginia P Sobiech MRN: [id number] DOB: 03/27/1940 Today's Date: 08/23/2018    History of Present Illness From MD H&P: The pt is an 78 yo female with past medical history of hypertension presents to the emergency department complaining of right hip pain.  The patient tripped and fell and immediately felt pain in the right leg and hip that was exacerbated by movement.  X-ray revealed a severely comminuted fracture of the right proximal femur.  Pt is now s/p right pertrochanteric femur fracture closed reduction and long cephalomedullary nail.    PT Comments    Pt in bed, feeling well.  Daughter in room.  Chart reviewed and ok by RN to get OOB.  Participated in exercises as described below.  To edge of bed with mod a x 1.  Stood with min a x 1 and was able to transfer to recliner at bedside for lunch with min a x 1.  She declined further mobility at this time.  HR and O2 within normal limits.   Follow Up Recommendations  SNF;Supervision for mobility/OOB     Equipment Recommendations  Rolling walker with 5" wheels;3in1 (PT)    Recommendations for Other Services       Precautions / Restrictions Precautions Precautions: Fall Restrictions Weight Bearing Restrictions: Yes RLE Weight Bearing: Weight bearing as tolerated    Mobility  Bed Mobility Overal bed mobility: Needs Assistance Bed Mobility: Supine to Sit     Supine to sit: Mod assist     General bed mobility comments: Mod verbal and tactile cues for sequencing  Transfers Overall transfer level: Needs assistance Equipment used: Rolling walker (2 wheeled) Transfers: Sit to/from Stand Sit to Stand: Min assist;From elevated surface         General transfer comment: Min verbal cues for sequencing  Ambulation/Gait Ambulation/Gait assistance: Min Chemical engineerassist Gait Distance (Feet): 3 Feet Assistive device: Rolling walker (2 wheeled) Gait Pattern/deviations: Step-to  pattern;Decreased step length - right;Decreased step length - left Gait velocity: decreased   General Gait Details: Agreed to OOB to chair for lunch but declined further gait trials   Stairs             Wheelchair Mobility    Modified Rankin (Stroke Patients Only)       Balance Overall balance assessment: Needs assistance   Sitting balance-Leahy Scale: Good     Standing balance support: Bilateral upper extremity supported Standing balance-Leahy Scale: Fair Standing balance comment: Heavy lean on the RW in standing                            Cognition Arousal/Alertness: Awake/alert Behavior During Therapy: WFL for tasks assessed/performed Overall Cognitive Status: Within Functional Limits for tasks assessed                                        Exercises Other Exercises Other Exercises: supine exercises AAROM ankle pumps, heel slides, ab/add and SLR x 10    General Comments        Pertinent Vitals/Pain Pain Assessment: Faces Faces Pain Scale: Hurts a little bit Pain Location: R hip Pain Descriptors / Indicators: Aching;Sore Pain Intervention(s): Limited activity within patient's tolerance;Monitored during session;Premedicated before session    Home Living  Prior Function            PT Goals (current goals can now be found in the care plan section) Progress towards PT goals: Progressing toward goals    Frequency    BID      PT Plan Current plan remains appropriate    Co-evaluation              AM-PAC PT "6 Clicks" Mobility   Outcome Measure  Help needed turning from your back to your side while in a flat bed without using bedrails?: A Lot Help needed moving from lying on your back to sitting on the side of a flat bed without using bedrails?: A Lot Help needed moving to and from a bed to a chair (including a wheelchair)?: A Lot Help needed standing up from a chair using your  arms (e.g., wheelchair or bedside chair)?: A Lot Help needed to walk in hospital room?: Total Help needed climbing 3-5 steps with a railing? : Total 6 Click Score: 10    End of Session Equipment Utilized During Treatment: Gait belt Activity Tolerance: Patient tolerated treatment well Patient left: with call bell/phone within reach;with family/visitor present;in chair;with chair alarm set Nurse Communication: Other (comment)       Time: 7544-9201 PT Time Calculation (min) (ACUTE ONLY): 15 min  Charges:  $Therapeutic Exercise: 8-22 mins                    Chesley Noon, PTA 08/23/18, 12:28 PM

## 2018-08-23 NOTE — Progress Notes (Signed)
   Subjective: 4 Days Post-Op Procedure(s) (LRB): INTRAMEDULLARY (IM) NAIL INTERTROCHANTRIC (Right) Patient reports pain as mild.   Patient is well, and has had no acute complaints or problems Patient still having difficulty with therapy and was only able to ambulate couple feet. Plan is to go Rehab after hospital stay. no nausea and no vomiting Patient denies any chest pains or shortness of breath. Patient still requesting to stay another day before going to rehab Still seems a little confused at times  Objective: Vital signs in last 24 hours: Temp:  [98.3 F (36.8 C)-99.1 F (37.3 C)] 98.5 F (36.9 C) (08/05 2314) Pulse Rate:  [86-101] 101 (08/05 2314) Resp:  [18] 18 (08/05 2314) BP: (118-138)/(50-54) 138/50 (08/05 2314) SpO2:  [99 %-100 %] 100 % (08/05 2314) well approximated incision Heels are non tender and elevated off the bed using rolled towels Intake/Output from previous day: 08/05 0701 - 08/06 0700 In: 720 [P.O.:720] Out: 850 [Urine:850] Intake/Output this shift: Total I/O In: 480 [P.O.:480] Out: 650 [Urine:650]  Recent Labs    08/21/18 0403 08/21/18 1259 08/22/18 0346 08/22/18 0938 08/23/18 0407  HGB 6.3* 9.1* 7.1* 8.1* 6.8*   Recent Labs    08/22/18 0346 08/22/18 0938 08/23/18 0407  WBC 10.1 10.7*  --   RBC 2.25* 2.58*  --   HCT 20.3* 23.5* 20.2*  PLT 140* 160  --    Recent Labs    08/20/18 0939 08/22/18 0346  NA 129* 132*  K 3.8 4.0  CL 102 105  CO2 18* 22  BUN 13 19  CREATININE 0.82 0.70  GLUCOSE 119* 104*  CALCIUM 7.6* 7.5*   No results for input(s): LABPT, INR in the last 72 hours.  EXAM General - Patient is Alert, Appropriate and Oriented Extremity - Neurologically intact Neurovascular intact Sensation intact distally Intact pulses distally Dorsiflexion/Plantar flexion intact No cellulitis present Compartment soft Dressing -moderate drainage on surgical bandage. Motor Function - intact, moving foot and toes well on exam.     Past Medical History:  Diagnosis Date  . Anxiety   . Bell's palsy   . Changes in skin texture   . Hair loss   . Hiatal hernia   . Hypertension   . Murmur, cardiac   . Nocturia   . Ovarian failure     Assessment/Plan: 4 Days Post-Op Procedure(s) (LRB): INTRAMEDULLARY (IM) NAIL INTERTROCHANTRIC (Right) Active Problems:   Hip fracture (HCC)  Estimated body mass index is 21.79 kg/m as calculated from the following:   Height as of this encounter: 5\' 7"  (1.702 m).   Weight as of this encounter: 63.1 kg. Up with therapy Discharge to SNF when medically cleared  Labs: Hemoglobin 6.8 this morning.  Mild tachycardia of 101 with blood pressure 138/50. DVT Prophylaxis - Lovenox, TED hose and SCDs Weight-Bearing as tolerated to right leg Patient has had a bowel movement. Continue Lovenox injections during milligrams twice daily when patient is discharged to rehab for 2 weeks We will need to follow-up in clinical clinic in 2 weeks for staple removal Continue TED stockings bilaterally  Reche Dixon, PA-[facility] [place] 08/23/2018, 6:55 AM

## 2018-08-23 NOTE — Progress Notes (Signed)
Physical Therapy Treatment Patient Details Name: Sandra [name] MRN: [id number] DOB: 11/30/1940 Today's Date: 08/23/2018    History of Present Illness From MD H&P: The pt is an 78 yo female with past medical history of hypertension presents to the emergency department complaining of right hip pain.  The patient tripped and fell and immediately felt pain in the right leg and hip that was exacerbated by movement.  X-ray revealed a severely comminuted fracture of the right proximal femur.  Pt is now s/p right pertrochanteric femur fracture closed reduction and long cephalomedullary nail.    PT Comments    Pt in recliner, wanting to toilet and bet back to bed.  Stood and transferred with min a x 1 with walker.  After voiding, she was able to walk 5' back to bed before fatigue.  She required min/mod assist and verbal cues to navigate walker and direct her in turning towards bed.  Fatigued with effort but pleased she was able to increase time out of bed today.   Follow Up Recommendations  SNF;Supervision for mobility/OOB     Equipment Recommendations  Rolling walker with 5" wheels;3in1 (PT)    Recommendations for Other Services       Precautions / Restrictions Precautions Precautions: Fall Restrictions Weight Bearing Restrictions: Yes RLE Weight Bearing: Weight bearing as tolerated    Mobility  Bed Mobility Overal bed mobility: Needs Assistance Bed Mobility: Sit to Supine     Supine to sit: Mod assist Sit to supine: Mod assist   General bed mobility comments: Mod verbal and tactile cues for sequencing  Transfers Overall transfer level: Needs assistance Equipment used: Rolling walker (2 wheeled) Transfers: Sit to/from Stand Sit to Stand: Min assist;From elevated surface         General transfer comment: Min verbal cues for sequencing  Ambulation/Gait Ambulation/Gait assistance: Min Chemical engineerassist Gait Distance (Feet): 5 Feet Assistive device: Rolling walker (2 wheeled) Gait  Pattern/deviations: Step-to pattern;Decreased step length - right;Decreased step length - left Gait velocity: decreased   General Gait Details: Agreed to OOB to chair for lunch but declined further gait trials   Stairs             Wheelchair Mobility    Modified Rankin (Stroke Patients Only)       Balance Overall balance assessment: Needs assistance Sitting-balance support: Feet supported Sitting balance-Leahy Scale: Good     Standing balance support: Bilateral upper extremity supported Standing balance-Leahy Scale: Fair Standing balance comment: Heavy lean on the RW in standing                            Cognition Arousal/Alertness: Awake/alert Behavior During Therapy: WFL for tasks assessed/performed Overall Cognitive Status: Within Functional Limits for tasks assessed                                        Exercises Other Exercises Other Exercises: to commode to void    General Comments        Pertinent Vitals/Pain Pain Assessment: Faces Faces Pain Scale: Hurts a little bit Pain Location: R hip Pain Descriptors / Indicators: Aching;Sore Pain Intervention(s): Limited activity within patient's tolerance;Monitored during session;Patient requesting pain meds-RN notified    Home Living                      Prior Function  PT Goals (current goals can now be found in the care plan section) Progress towards PT goals: Progressing toward goals    Frequency    BID      PT Plan Current plan remains appropriate    Co-evaluation              AM-PAC PT "6 Clicks" Mobility   Outcome Measure  Help needed turning from your back to your side while in a flat bed without using bedrails?: A Lot Help needed moving from lying on your back to sitting on the side of a flat bed without using bedrails?: A Lot Help needed moving to and from a bed to a chair (including a wheelchair)?: A Lot Help needed standing  up from a chair using your arms (e.g., wheelchair or bedside chair)?: A Lot Help needed to walk in hospital room?: Total Help needed climbing 3-5 steps with a railing? : Total 6 Click Score: 10    End of Session Equipment Utilized During Treatment: Gait belt Activity Tolerance: Patient tolerated treatment well Patient left: in bed;with call bell/phone within reach;with bed alarm set;with family/visitor present Nurse Communication: Other (comment)       Time: 2952-8413 PT Time Calculation (min) (ACUTE ONLY): 16 min  Charges:  $Therapeutic Exercise: 8-22 mins $Therapeutic Activity: 8-22 mins                     Chesley Noon, PTA 08/23/18, 3:21 PM

## 2018-08-23 NOTE — Discharge Instructions (Signed)
INSTRUCTIONS AFTER Surgery  o Remove items at home which could result in a fall. This includes throw rugs or furniture in walking pathways o ICE to the affected joint every three hours while awake for 30 minutes at a time, for at least the first 3-5 days, and then as needed for pain and swelling.  Continue to use ice for pain and swelling. You may notice swelling that will progress down to the foot and ankle.  This is normal after surgery.  Elevate your leg when you are not up walking on it.   o Continue to use the breathing machine you got in the hospital (incentive spirometer) which will help keep your temperature down.  It is common for your temperature to cycle up and down following surgery, especially at night when you are not up moving around and exerting yourself.  The breathing machine keeps your lungs expanded and your temperature down.   DIET:  As you were doing prior to hospitalization, we recommend a well-balanced diet.  DRESSING / WOUND CARE / SHOWERING  Dressing changes needed.  Reinforced dressing if minimal drainage.  No showering.  Staples can be removed in 2 weeks with application of Steri-Strips.  Watch for any opening of the wound.  Contact [facility] if any complication.  ACTIVITY  o Increase activity slowly as tolerated, but follow the weight bearing instructions below.   o No driving for 6 weeks or until further direction given by your physician.  You cannot drive while taking narcotics.  o No lifting or carrying greater than 10 lbs. until further directed by your surgeon. o Avoid periods of inactivity such as sitting longer than an hour when not asleep. This helps prevent blood clots.  o You may return to work once you are authorized by your doctor.     WEIGHT BEARING  Weightbearing as tolerated   EXERCISES Gait training and ambulation with a walker.  CONSTIPATION  Constipation is defined medically as fewer than three stools per week and severe  constipation as less than one stool per week.  Even if you have a regular bowel pattern at home, your normal regimen is likely to be disrupted due to multiple reasons following surgery.  Combination of anesthesia, postoperative narcotics, change in appetite and fluid intake all can affect your bowels.   YOU MUST use at least one of the following options; they are listed in order of increasing strength to get the job done.  They are all available over the counter, and you may need to use some, POSSIBLY even all of these options:    Drink plenty of fluids (prune juice may be helpful) and high fiber foods Colace 100 mg by mouth twice a day  Senokot for constipation as directed and as needed Dulcolax (bisacodyl), take with full glass of water  Miralax (polyethylene glycol) once or twice a day as needed.  If you have tried all these things and are unable to have a bowel movement in the first 3-4 days after surgery call either your surgeon or your primary doctor.    If you experience loose stools or diarrhea, hold the medications until you stool forms back up.  If your symptoms do not get better within 1 week or if they get worse, check with your doctor.  If you experience "the worst abdominal pain ever" or develop nausea or vomiting, please contact the office immediately for further recommendations for treatment.   ITCHING:  If you experience itching with your medications,  try taking only a single pain pill, or even half a pain pill at a time.  You can also use Benadryl over the counter for itching or also to help with sleep.   TED HOSE STOCKINGS:  Use stockings on both legs until for at least 2 weeks or as directed by physician office. They may be removed at night for sleeping.  MEDICATIONS:  See your medication summary on the After Visit Summary that nursing will review with you.  You may have some home medications which will be placed on hold until you complete the course of blood thinner  medication.  It is important for you to complete the blood thinner medication as prescribed.  PRECAUTIONS:  If you experience chest pain or shortness of breath - call 911 immediately for transfer to the hospital emergency department.   If you develop a fever greater that 101 F, purulent drainage from wound, increased redness or drainage from wound, foul odor from the wound/dressing, or calf pain - CONTACT YOUR SURGEON.                                                   FOLLOW-UP APPOINTMENTS:  If you do not already have a post-op appointment, please call the office for an appointment to be seen by your surgeon.  Guidelines for how soon to be seen are listed in your After Visit Summary, but are typically between 1-4 weeks after surgery.  OTHER INSTRUCTIONS:     MAKE SURE YOU:   Understand these instructions.   Get help right away if you are not doing well or get worse.    Thank you for letting us be a part of your medical care team.  It is a privilege we respect greatly.  We hope these instructions will help you stay on track for a fast and full recovery!

## 2018-08-24 DIAGNOSIS — S72001A Fracture of unspecified part of neck of right femur, initial encounter for closed fracture: Secondary | ICD-10-CM | POA: Diagnosis not present

## 2018-08-24 DIAGNOSIS — M8000XD Age-related osteoporosis with current pathological fracture, unspecified site, subsequent encounter for fracture with routine healing: Secondary | ICD-10-CM | POA: Diagnosis not present

## 2018-08-24 DIAGNOSIS — W19XXXD Unspecified fall, subsequent encounter: Secondary | ICD-10-CM | POA: Diagnosis not present

## 2018-08-24 DIAGNOSIS — F411 Generalized anxiety disorder: Secondary | ICD-10-CM | POA: Diagnosis not present

## 2018-08-24 DIAGNOSIS — R031 Nonspecific low blood-pressure reading: Secondary | ICD-10-CM | POA: Diagnosis not present

## 2018-08-24 DIAGNOSIS — G510 Bell's palsy: Secondary | ICD-10-CM | POA: Diagnosis not present

## 2018-08-24 DIAGNOSIS — I959 Hypotension, unspecified: Secondary | ICD-10-CM | POA: Diagnosis not present

## 2018-08-24 DIAGNOSIS — I10 Essential (primary) hypertension: Secondary | ICD-10-CM | POA: Diagnosis not present

## 2018-08-24 DIAGNOSIS — M2550 Pain in unspecified joint: Secondary | ICD-10-CM | POA: Diagnosis not present

## 2018-08-24 DIAGNOSIS — M25551 Pain in right hip: Secondary | ICD-10-CM | POA: Diagnosis not present

## 2018-08-24 DIAGNOSIS — D72829 Elevated white blood cell count, unspecified: Secondary | ICD-10-CM | POA: Diagnosis not present

## 2018-08-24 DIAGNOSIS — Z7401 Bed confinement status: Secondary | ICD-10-CM | POA: Diagnosis not present

## 2018-08-24 DIAGNOSIS — E871 Hypo-osmolality and hyponatremia: Secondary | ICD-10-CM | POA: Diagnosis not present

## 2018-08-24 DIAGNOSIS — W19XXXA Unspecified fall, initial encounter: Secondary | ICD-10-CM | POA: Diagnosis not present

## 2018-08-24 DIAGNOSIS — D649 Anemia, unspecified: Secondary | ICD-10-CM | POA: Diagnosis not present

## 2018-08-24 DIAGNOSIS — D62 Acute posthemorrhagic anemia: Secondary | ICD-10-CM | POA: Diagnosis not present

## 2018-08-24 DIAGNOSIS — E039 Hypothyroidism, unspecified: Secondary | ICD-10-CM | POA: Diagnosis not present

## 2018-08-24 DIAGNOSIS — S72141D Displaced intertrochanteric fracture of right femur, subsequent encounter for closed fracture with routine healing: Secondary | ICD-10-CM | POA: Diagnosis not present

## 2018-08-24 LAB — HEMOGLOBIN AND HEMATOCRIT, BLOOD
HCT: 23.1 % — ABNORMAL LOW (ref 36.0–46.0)
Hemoglobin: 7.6 g/dL — ABNORMAL LOW (ref 12.0–15.0)

## 2018-08-24 LAB — SARS CORONAVIRUS 2 BY RT PCR (HOSPITAL ORDER, PERFORMED IN ~~LOC~~ HOSPITAL LAB): SARS Coronavirus 2: NEGATIVE

## 2018-08-24 MED ORDER — DOCUSATE SODIUM 100 MG PO CAPS: 100.0000 mg | ORAL_CAPSULE | Freq: Two times a day (BID) | ORAL | 0 refills | Status: DC

## 2018-08-24 MED ORDER — BISACODYL 10 MG RE SUPP: 10.0000 mg | Freq: Every day | RECTAL | 0 refills | Status: DC | PRN

## 2018-08-24 MED ORDER — MAGNESIUM HYDROXIDE 400 MG/5ML PO SUSP: 15.0000 mL | Freq: Every evening | ORAL | 0 refills | Status: DC | PRN

## 2018-08-24 MED ORDER — ALUM & MAG HYDROXIDE-SIMETH 200-200-20 MG/5ML PO SUSP: 30.0000 mL | Freq: Four times a day (QID) | ORAL | 0 refills | Status: DC | PRN

## 2018-08-24 NOTE — Progress Notes (Signed)
Physical Therapy Treatment Patient Details Name: Sandra [name] MRN: [id number] DOB: 10-18-1940 Today's Date: 08/24/2018    History of Present Illness 78 yo female with past medical history of hypertension presents to the emergency department complaining of right hip pain.  The patient tripped and fell and immediately felt pain in the right leg and hip that was exacerbated by movement.  X-ray revealed a severely comminuted fracture of the right proximal femur.  Pt is now s/p right pertrochanteric femur fracture closed reduction and long cephalomedullary nail.    PT Comments    Pt was able to significantly increase the amount of ambulation she was able to perform, though she still remains very limited (~30 ft with great effort and HR into the 130s, O2 remained high 90s on room air).  She had some AROM in R LE with exercises but did need AAROM with most exercises along with considerable cuing and encouragement.  Overall pt did relatively well as compared to previous PT sessions however, eager to get to rehab and keep working on strength and mobility.    Follow Up Recommendations  SNF;Supervision for mobility/OOB     Equipment Recommendations  Rolling walker with 5" wheels;3in1 (PT)    Recommendations for Other Services       Precautions / Restrictions Precautions Precautions: Fall Restrictions Weight Bearing Restrictions: Yes RLE Weight Bearing: Weight bearing as tolerated    Mobility  Bed Mobility Overal bed mobility: Needs Assistance Bed Mobility: Sit to Supine     Supine to sit: Min assist     General bed mobility comments: Pt was able to rise to EOB with assist to get R LE toward EOB and assist with both LEs off bed, pt able to elevate torso slowly and with heavy UE use but did not need physical assist  Transfers Overall transfer level: Needs assistance Equipment used: Rolling walker (2 wheeled) Transfers: Sit to/from Stand Sit to Stand: Min guard         General  transfer comment: Cues for UE use, positioning, sequencing - able to rise w/o direct assist  Ambulation/Gait Ambulation/Gait assistance: Min assist Gait Distance (Feet): 30 Feet Assistive device: Rolling walker (2 wheeled)       General Gait Details: Pt was able to do some standing/ambulation more than just minimal transfers today.  She is still guarded/hesitant and reliant on the walker but was able to put together a "prolonged" bout of ambualtion this date with no LOBs and only minimal need for phyiscal assist, gait training/cuing/encouragement t/o the effort.    Stairs             Wheelchair Mobility    Modified Rankin (Stroke Patients Only)       Balance                                            Cognition Arousal/Alertness: Awake/alert Behavior During Therapy: WFL for tasks assessed/performed Overall Cognitive Status: Within Functional Limits for tasks assessed                                        Exercises Total Joint Exercises Ankle Circles/Pumps: Strengthening;15 reps Quad Sets: Strengthening;15 reps Heel Slides: AAROM;AROM;10 reps Hip ABduction/ADduction: AROM;10 reps Straight Leg Raises: AAROM;10 reps Knee Flexion: AROM;10 reps  General Comments        Pertinent Vitals/Pain Pain Assessment: 0-10 Pain Score: 6  Pain Location: R hip    Home Living                      Prior Function            PT Goals (current goals can now be found in the care plan section) Progress towards PT goals: Progressing toward goals    Frequency    BID      PT Plan Current plan remains appropriate    Co-evaluation              AM-PAC PT "6 Clicks" Mobility   Outcome Measure  Help needed turning from your back to your side while in a flat bed without using bedrails?: A Little Help needed moving from lying on your back to sitting on the side of a flat bed without using bedrails?: A Little Help needed  moving to and from a bed to a chair (including a wheelchair)?: A Little Help needed standing up from a chair using your arms (e.g., wheelchair or bedside chair)?: A Little Help needed to walk in hospital room?: A Little Help needed climbing 3-5 steps with a railing? : A Lot 6 Click Score: 17    End of Session Equipment Utilized During Treatment: Gait belt Activity Tolerance: Patient tolerated treatment well Patient left: in bed;with call bell/phone within reach;with bed alarm set;with family/visitor present   PT Visit Diagnosis: Unsteadiness on feet (R26.81);Other abnormalities of gait and mobility (R26.89);Muscle weakness (generalized) (M62.81)     Time: 0916-0943 [id number] Time Calculation (min) (ACUTE ONLY): 27 min  Charges:  $Gait Training: 8-22 mins $Therapeutic Exercise: 8-22 mins                     Sandra [name], DPT 08/24/2018, 10:40 AM

## 2018-08-24 NOTE — Discharge Summary (Signed)
[address]Sandra [name], is a 78 y.o. female  DOB 08/03/1940  MRN [id number].  Admission date:  08/19/2018  Admitting Physician  Arnaldo NatalMichael S Diamond, MD  Discharge Date:  08/24/2018   Primary MD  Alba CorySowles, Krichna, MD  Recommendations for primary care physician for things to follow:  Follow-up with PCP in 1 week Follow-up with [facility] in 2 weeks for staple removal.   Admission Diagnosis  Closed comminuted intertrochanteric fracture of proximal end of right femur, initial encounter (HCC) [S72.141A] Leukocytosis, unspecified type [D72.829]   Discharge Diagnosis  Closed comminuted intertrochanteric fracture of proximal end of right femur, initial encounter (HCC) [S72.141A] Leukocytosis, unspecified type [D72.829]    Active Problems:   Hip fracture [facility](HCC)      Past Medical History:  Diagnosis Date  . Anxiety   . Bell's palsy   . Changes in skin texture   . Hair loss   . Hiatal hernia   . Hypertension   . Murmur, cardiac   . Nocturia   . Ovarian failure     Past Surgical History:  Procedure Laterality Date  . ABDOMINAL HYSTERECTOMY    . INTRAMEDULLARY (IM) NAIL INTERTROCHANTERIC Right 08/19/2018   Procedure: INTRAMEDULLARY (IM) NAIL INTERTROCHANTRIC;  Surgeon: Karleen HampshireNappo, Kyle E, MD;  Location: [facility] ORS;  Service: Orthopedics;  Laterality: Right;  . MULTIPLE TOOTH EXTRACTIONS N/A    patient had all of her bottom teeth pulled about 2-3 weeks ago.  . TONSILLECTOMY         History of present illness and  Hospital Course:     Kindly see H&P for history of present illness and admission details, please review complete Labs, Consult reports and Test reports for all details in brief  HPI  from the history and physical done on the day of admission 78 year old female patient with history of essential hypertension is admitted for  right hip fracture.   Hospital Course  #1 .acute comminuted fracture of right femur, closed, status post repair by orthopedic with ORIF.  On August 2.  Postoperatively patient is improving very slowly.  Orthopedic recommends 6 6 weeks of anticoagulation with Lovenox, prescriptions are already given by them.  Use oxycodone as needed for pain control, patient is going to rehab for physical therapy.  [facility] for staple removal.  [facility]  2.  Postop blood loss anemia, patient baseline hemoglobin is around 11, hemoglobin decreased to 6.3, required 1 unit of blood transfusion, anemia panel showed normal iron, B12, folate levels patient blood loss anemia, present hemoglobin 7.7. #3. essential hypertension, well. #4. hyponatremia: Improved 5.  Constipation, use stool softeners as needed. COVID-19 test is negative on a August 2.   discharge Condition: Stable   Follow UP   Contact information for follow-up providers    Hooten, Illene LabradorJames P, MD Follow up in 6 week(s).   Specialty: Orthopedic Surgery Why: X-rays Contact information: [address] [facility] [place] [id number] [phone]            Contact information for after-discharge care    Destination    [facility] .   Service: Skilled Nursing Contact information: [address] [place] [id number] [phone]                    Discharge Instructions  and  Discharge Medications     Allergies as of 08/24/2018  No Known Allergies     Medication List    TAKE these medications   alum & mag hydroxide-simeth 200-200-20 MG/5ML suspension Commonly known as: MAALOX/MYLANTA Take 30 mLs by mouth every 6 (six) hours as needed for indigestion or heartburn.   aspirin 81 MG tablet Take 81 mg by mouth every other day.   bisacodyl 10 MG suppository Commonly known as:  DULCOLAX Place 1 suppository (10 mg total) rectally daily as needed for moderate constipation.   docusate sodium 100 MG capsule Commonly known as: COLACE Take 1 capsule (100 mg total) by mouth 2 (two) times daily.   enoxaparin 40 MG/0.4ML injection Commonly known as: LOVENOX Inject 0.4 mLs (40 mg total) into the skin daily for 14 doses.   lisinopril 10 MG tablet Commonly known as: ZESTRIL Take 1 tablet (10 mg total) by mouth daily.   magnesium hydroxide 400 MG/5ML suspension Commonly known as: MILK OF MAGNESIA Take 15 mLs by mouth at bedtime as needed for mild constipation.   multivitamin tablet Take 1 tablet by mouth daily.   oxyCODONE-acetaminophen 5-325 MG tablet Commonly known as: PERCOCET/ROXICET Take 1 tablet by mouth every 6 (six) hours as needed for moderate pain.         Diet and Activity recommendation: See Discharge Instructions above   Consults obtained -orthopedic, physical therapy  Major procedures and Radiology Reports - PLEASE review detailed and final reports for all details, in brief -      Dg Chest Port 1 View  Result Date: 08/19/2018 CLINICAL DATA:  Status post fall. EXAM: PORTABLE CHEST 1 VIEW COMPARISON:  Chest radiograph 08/01/2008 FINDINGS: Normal cardiac and mediastinal contours. No consolidative pulmonary opacities. No pleural effusion or pneumothorax. Thoracic spine degenerative changes. IMPRESSION: No acute cardiopulmonary process. Electronically Signed   By: Annia Beltrew  Davis M.D.   On: 08/19/2018 04:53   Dg Hip Operative Unilat W Or W/o Pelvis Right  Result Date: 08/19/2018 CLINICAL DATA:  Right hip ORIF EXAM: OPERATIVE RIGHT HIP (WITH PELVIS IF PERFORMED) 5 VIEWS TECHNIQUE: Fluoroscopic spot image(s) were submitted for interpretation post-operatively. COMPARISON:  None. FINDINGS: Five fluoroscopic images were obtained during open reduction internal fixation of a right femoral fracture. Fluoroscopy time was reported as 1 minutes 10 seconds.  IMPRESSION: Intraoperative fluoroscopy. Electronically Signed   By: Deatra RobinsonKevin  Herman M.D.   On: 08/19/2018 21:43   Dg Hip Unilat  With Pelvis 2-3 Views Right  Result Date: 08/19/2018 CLINICAL DATA:  Patient status post fall.  Initial encounter. EXAM: DG HIP (WITH OR WITHOUT PELVIS) 2-3V RIGHT COMPARISON:  None. FINDINGS: There is a comminuted displaced intertrochanteric fracture through the proximal right femur. SI joints are unremarkable. Lumbar spine degenerative change. IMPRESSION: Comminuted displaced right intertrochanteric proximal femur fracture. Electronically Signed   By: Annia Beltrew  Davis M.D.   On: 08/19/2018 04:52   Dg Femur Min 2 Views Right  Result Date: 08/19/2018 CLINICAL DATA:  Right hip fracture.  Evaluate distal femur. EXAM: RIGHT FEMUR 2 VIEWS COMPARISON:  Hip radiograph 08/19/2018 FINDINGS: Two radiographic images of the distal femur were obtained demonstrating no evidence for displaced fracture. Mild degenerative changes of the knee joint. Regional soft tissues unremarkable. Known intertrochanteric proximal right femur fracture demonstrated on prior hip films. IMPRESSION: No acute process involving the distal right femur. Patient has known proximal right femur fracture. Electronically Signed   By: Annia Beltrew  Davis M.D.   On: 08/19/2018 06:01   Dg Femur [place] Views Right  Result Date: 08/19/2018 CLINICAL DATA:  Status post ORIF  proximal right femur fracture. EXAM: RIGHT FEMUR PORTABLE 2 VIEW COMPARISON:  None. FINDINGS: IM nail and hip screw are identified status post open reduction and internal fixation of comminuted fracture deformity involving the proximal right femur. The hardware components and fracture fragments are in near anatomic alignment. No complicating features identified. Gas is identified within the soft tissues overlying the right hip. IMPRESSION: 1. Status post ORIF of proximal right femur fracture. Electronically Signed   By: Kerby Moors M.D.   On: 08/19/2018 22:00     Micro Results     Recent Results (from the past 240 hour(s))  SARS Coronavirus 2 [facility] order, Performed in [facility] hospital lab) Nasopharyngeal Nasopharyngeal Swab     Status: None   Collection Time: 08/19/18  5:06 AM   Specimen: Nasopharyngeal Swab  Result Value Ref Range Status   SARS Coronavirus 2 NEGATIVE NEGATIVE Final    Comment: (NOTE) If result is NEGATIVE SARS-CoV-2 target nucleic acids are NOT DETECTED. The SARS-CoV-2 RNA is generally detectable in upper and lower  respiratory specimens during the acute phase of infection. The lowest  concentration of SARS-CoV-2 viral copies this assay can detect is 250  copies / mL. A negative result does not preclude SARS-CoV-2 infection  and should not be used as the sole basis for treatment or other  patient management decisions.  A negative result may occur with  improper specimen collection / handling, submission of specimen other  than nasopharyngeal swab, presence of viral mutation(s) within the  areas targeted by this assay, and inadequate number of viral copies  (<250 copies / mL). A negative result must be combined with clinical  observations, patient history, and epidemiological information. If result is POSITIVE SARS-CoV-2 target nucleic acids are DETECTED. The SARS-CoV-2 RNA is generally detectable in upper and lower  respiratory specimens dur ing the acute phase of infection.  Positive  results are indicative of active infection with SARS-CoV-2.  Clinical  correlation with patient history and other diagnostic information is  necessary to determine patient infection status.  Positive results do  not rule out bacterial infection or co-infection with other viruses. If result is PRESUMPTIVE POSTIVE SARS-CoV-2 nucleic acids MAY BE PRESENT.   A presumptive positive result was obtained on the submitted specimen  and confirmed on repeat testing.  While 2019 novel coronavirus  (SARS-CoV-2) nucleic acids may be  present in the submitted sample  additional confirmatory testing may be necessary for epidemiological  and / or clinical management purposes  to differentiate between  SARS-CoV-2 and other Sarbecovirus currently known to infect humans.  If clinically indicated additional testing with an alternate test  methodology [phone]) is advised. The SARS-CoV-2 RNA is generally  detectable in upper and lower respiratory sp ecimens during the acute  phase of infection. The expected result is Negative. Fact Sheet for Patients:  StrictlyIdeas.no Fact Sheet for Healthcare Providers: BankingDealers.co.za This test is not yet approved or cleared by the [place] FDA and has been authorized for detection and/or diagnosis of SARS-CoV-2 by FDA under an Emergency Use Authorization (EUA).  This EUA will remain in effect (meaning this test can be used) for the duration of the COVID-19 declaration under Section 564(b)(1) of the Act, 21 U.S.C. section 360bbb-3(b)(1), unless the authorization is terminated or revoked sooner. Performed at [facility], [address] [place], [place] [address]   Surgical PCR screen     Status: None   Collection Time: 08/19/18 10:51 AM   Specimen: Nasal Mucosa; Nasal Swab  Result Value Ref Range Status   MRSA, PCR NEGATIVE NEGATIVE Final   Staphylococcus aureus NEGATIVE NEGATIVE Final    Comment: (NOTE) The Xpert SA Assay (FDA approved for NASAL specimens in patients 80 years of age and older), is one component of a comprehensive surveillance program. It is not intended to diagnose infection nor to guide or monitor treatment. Performed at [facility], [address] [place], [place] [address]        Today   Subjective:   [place] today h has less right hip pain, eager to go to rehab. Objective:   Blood pressure (!) 124/51, pulse 80, temperature 97.9 F (36.6 C), temperature source  Oral, resp. rate 20, height 5\' 7"  (1.702 m), weight 63.1 kg, SpO2 99 %.   Intake/Output Summary (Last 24 hours) at 08/24/2018 0843 Last data filed at 08/24/2018 0804 Gross per 24 hour  Intake 440 ml  Output -  Net 440 ml    Exam Awake Alert, Oriented x 3, No new F.N deficits, Normal affect [place].AT,PERRAL Supple Neck,No JVD, No cervical lymphadenopathy appriciated.  Symmetrical Chest wall movement, Good air movement bilaterally, CTAB RRR,No Gallops,Rubs or new Murmurs, No Parasternal Heave +ve B.Sounds, Abd Soft, Non tender, No organomegaly appriciated, No rebound -guarding or rigidity. Right hip dressing present  Data Review   CBC w Diff:  Lab Results  Component Value Date   WBC 10.1 08/23/2018   HGB 7.7 (L) 08/23/2018   HGB 13.1 10/27/2014   HCT 22.7 (L) 08/23/2018   HCT 38.6 10/27/2014   PLT 218 08/23/2018   PLT 240 10/27/2014   LYMPHOPCT 8 08/19/2018   MONOPCT 8 08/19/2018   EOSPCT 0 08/19/2018   BASOPCT 0 08/19/2018    CMP:  Lab Results  Component Value Date   NA 132 (L) 08/22/2018   NA 137 10/27/2014   K 4.0 08/22/2018   CL 105 08/22/2018   CO2 22 08/22/2018   BUN 19 08/22/2018   BUN 11 10/27/2014   CREATININE 0.70 08/22/2018   CREATININE 0.82 11/14/2017   PROT 6.9 08/19/2018   PROT 7.3 10/27/2014   ALBUMIN 3.7 08/19/2018   ALBUMIN 4.5 10/27/2014   BILITOT 0.6 08/19/2018   BILITOT 0.4 10/27/2014   ALKPHOS 68 08/19/2018   AST 32 08/19/2018   ALT 23 08/19/2018  .   Total Time in preparing paper work, data evaluation and todays exam - 35 minutes  Katha HammingSnehalatha Nadiyah Zeis M.D on 08/24/2018 at 8:43 AM    Note: This dictation was prepared with Dragon dictation along with smaller phrase technology. Any transcriptional errors that result from this process are unintentional.

## 2018-08-24 NOTE — TOC Transition Note (Signed)
Transition of Care [facility]) - CM/SW Discharge Note   Patient Details  Name: Sandra [name] MRN: [id number] Date of Birth: 1940/09/15  [facility] CM/SW Contact:  Su Hilt, RN Phone Number: 08/24/2018, 11:28 AM   Clinical Narrative:    Patient to discharge to [facility] room 511 once the covid results come back from lab The bedside nurse to call report to [facility] and call for transport via ems once ready.  The daughter is in the room and I provided her with the phone number to [facility] and the room number.   Final next level of care: [place] Barriers to Discharge: Barriers Resolved   Patient Goals and CMS Choice        Discharge Placement                       Discharge Plan and Services                                     Social Determinants of Health (SDOH) Interventions     Readmission Risk Interventions No flowsheet data found.

## 2018-08-24 NOTE — Progress Notes (Signed)
Report called to [employer].  Will wait until rapid Covid test results before calling transport to pick patient up .  All personal belongings with patient. Patient is alert and oriented to transfer of care today.

## 2018-08-24 NOTE — Progress Notes (Signed)
Was told by case manager just now, that patient needs rapid COVID testing,orders placed

## 2018-08-24 NOTE — Progress Notes (Signed)
   Subjective: 5 Days Post-Op Procedure(s) (LRB): INTRAMEDULLARY (IM) NAIL INTERTROCHANTRIC (Right) Patient reports pain as mild.   Patient is well, and has had no acute complaints or problems Patient still having difficulty with therapy and was only able to ambulate couple feet. Plan is to go Rehab after hospital stay.  Possible today. no nausea and no vomiting Patient denies any chest pains or shortness of breath. Still seems a little confused at times  Objective: Vital signs in last 24 hours: Temp:  [98.7 F (37.1 C)-99.1 F (37.3 C)] 98.7 F (37.1 C) (08/06 2212) Pulse Rate:  [83-92] 83 (08/06 2212) Resp:  [16-20] 20 (08/06 2212) BP: (102-127)/(55-85) 102/85 (08/06 2212) SpO2:  [98 %-99 %] 98 % (08/06 2212) well approximated incision Heels are non tender and elevated off the bed using rolled towels Intake/Output from previous day: 08/06 0701 - 08/07 0700 In: 240 [P.O.:240] Out: -  Intake/Output this shift: No intake/output data recorded.  Recent Labs    08/21/18 1259 08/22/18 0346 08/22/18 0938 08/23/18 0407 08/23/18 1058  HGB 9.1* 7.1* 8.1* 6.8* 7.7*   Recent Labs    08/22/18 0938 08/23/18 0407 08/23/18 1058  WBC 10.7*  --  10.1  RBC 2.58*  --  2.45*  HCT 23.5* 20.2* 22.7*  PLT 160  --  218   Recent Labs    08/22/18 0346  NA 132*  K 4.0  CL 105  CO2 22  BUN 19  CREATININE 0.70  GLUCOSE 104*  CALCIUM 7.5*   No results for input(s): LABPT, INR in the last 72 hours.  EXAM General - Patient is Alert, Appropriate and Oriented Extremity - Neurologically intact Neurovascular intact Sensation intact distally Intact pulses distally Dorsiflexion/Plantar flexion intact No cellulitis present Compartment soft Dressing -moderate old drainage on surgical bandage. Motor Function - intact, moving foot and toes well on exam.    Past Medical History:  Diagnosis Date  . Anxiety   . Bell's palsy   . Changes in skin texture   . Hair loss   . Hiatal  hernia   . Hypertension   . Murmur, cardiac   . Nocturia   . Ovarian failure     Assessment/Plan: 5 Days Post-Op Procedure(s) (LRB): INTRAMEDULLARY (IM) NAIL INTERTROCHANTRIC (Right) Active Problems:   Hip fracture (HCC)  Estimated body mass index is 21.79 kg/m as calculated from the following:   Height as of this encounter: 5\' 7"  (1.702 m).   Weight as of this encounter: 63.1 kg. Up with therapy Discharge to SNF when medically cleared  Labs: Hemoglobin 7.7 this morning.  Mild tachycardia of 101 with blood pressure 138/50. DVT Prophylaxis - Lovenox, TED hose and SCDs Weight-Bearing as tolerated to right leg Patient has had a bowel movement. Continue Lovenox injections during milligrams twice daily when patient is discharged to rehab for 2 weeks We will need to follow-up in clinical clinic in 2 weeks for staple removal Continue TED stockings bilaterally Check hemoglobin this morning  Reche Dixon, PA-C [place] 08/24/2018, 6:22 AM

## 2018-08-24 NOTE — Progress Notes (Signed)
Non emergent transport notified of patient transfer need to [facility] this date. Transport scheduled for 1530 or after.

## 2018-08-24 NOTE — Progress Notes (Signed)
Called and left a message to patient's daughter Olin Hauser.

## 2018-08-24 NOTE — Progress Notes (Signed)
Discharge to [facility] if COVID-19 test is negative.

## 2018-08-27 DIAGNOSIS — M8000XD Age-related osteoporosis with current pathological fracture, unspecified site, subsequent encounter for fracture with routine healing: Secondary | ICD-10-CM | POA: Diagnosis not present

## 2018-08-27 DIAGNOSIS — I10 Essential (primary) hypertension: Secondary | ICD-10-CM | POA: Diagnosis not present

## 2018-08-27 DIAGNOSIS — E871 Hypo-osmolality and hyponatremia: Secondary | ICD-10-CM | POA: Diagnosis not present

## 2018-08-27 DIAGNOSIS — M810 Age-related osteoporosis without current pathological fracture: Secondary | ICD-10-CM | POA: Insufficient documentation

## 2018-09-03 DIAGNOSIS — M25551 Pain in right hip: Secondary | ICD-10-CM | POA: Diagnosis not present

## 2018-09-10 DIAGNOSIS — S72141D Displaced intertrochanteric fracture of right femur, subsequent encounter for closed fracture with routine healing: Secondary | ICD-10-CM | POA: Diagnosis not present

## 2018-09-10 DIAGNOSIS — F411 Generalized anxiety disorder: Secondary | ICD-10-CM | POA: Diagnosis not present

## 2018-09-10 DIAGNOSIS — K5900 Constipation, unspecified: Secondary | ICD-10-CM | POA: Diagnosis not present

## 2018-09-10 DIAGNOSIS — Z7901 Long term (current) use of anticoagulants: Secondary | ICD-10-CM | POA: Diagnosis not present

## 2018-09-10 DIAGNOSIS — G510 Bell's palsy: Secondary | ICD-10-CM | POA: Diagnosis not present

## 2018-09-10 DIAGNOSIS — W19XXXD Unspecified fall, subsequent encounter: Secondary | ICD-10-CM | POA: Diagnosis not present

## 2018-09-10 DIAGNOSIS — I10 Essential (primary) hypertension: Secondary | ICD-10-CM | POA: Diagnosis not present

## 2018-09-14 ENCOUNTER — Telehealth: Payer: Self-pay | Admitting: Family Medicine

## 2018-09-14 NOTE — Telephone Encounter (Signed)
Home Health Verbal Orders - Caller/Agency: Cesar/ [facility] Callback Number: [phone] Requesting OT/PT/Skilled Nursing/Social Work/Speech Therapy: PT Frequency: 2x for 2wks, 1x for 2wks. Evaluation was done this week. Please advise.

## 2018-09-17 ENCOUNTER — Encounter: Payer: Self-pay | Admitting: Family Medicine

## 2018-09-20 DIAGNOSIS — W19XXXD Unspecified fall, subsequent encounter: Secondary | ICD-10-CM | POA: Diagnosis not present

## 2018-09-20 DIAGNOSIS — I10 Essential (primary) hypertension: Secondary | ICD-10-CM | POA: Diagnosis not present

## 2018-09-20 DIAGNOSIS — S72141D Displaced intertrochanteric fracture of right femur, subsequent encounter for closed fracture with routine healing: Secondary | ICD-10-CM | POA: Diagnosis not present

## 2018-09-20 DIAGNOSIS — F411 Generalized anxiety disorder: Secondary | ICD-10-CM | POA: Diagnosis not present

## 2018-09-20 DIAGNOSIS — G510 Bell's palsy: Secondary | ICD-10-CM | POA: Diagnosis not present

## 2018-09-20 DIAGNOSIS — Z7901 Long term (current) use of anticoagulants: Secondary | ICD-10-CM | POA: Diagnosis not present

## 2018-09-20 DIAGNOSIS — K5900 Constipation, unspecified: Secondary | ICD-10-CM | POA: Diagnosis not present

## 2018-09-25 DIAGNOSIS — F411 Generalized anxiety disorder: Secondary | ICD-10-CM | POA: Diagnosis not present

## 2018-09-25 DIAGNOSIS — W19XXXD Unspecified fall, subsequent encounter: Secondary | ICD-10-CM | POA: Diagnosis not present

## 2018-09-25 DIAGNOSIS — S72141D Displaced intertrochanteric fracture of right femur, subsequent encounter for closed fracture with routine healing: Secondary | ICD-10-CM | POA: Diagnosis not present

## 2018-09-25 DIAGNOSIS — G510 Bell's palsy: Secondary | ICD-10-CM | POA: Diagnosis not present

## 2018-09-25 DIAGNOSIS — K5900 Constipation, unspecified: Secondary | ICD-10-CM | POA: Diagnosis not present

## 2018-09-25 DIAGNOSIS — Z7901 Long term (current) use of anticoagulants: Secondary | ICD-10-CM | POA: Diagnosis not present

## 2018-09-25 DIAGNOSIS — I10 Essential (primary) hypertension: Secondary | ICD-10-CM | POA: Diagnosis not present

## 2018-09-27 DIAGNOSIS — K5900 Constipation, unspecified: Secondary | ICD-10-CM | POA: Diagnosis not present

## 2018-09-27 DIAGNOSIS — S72141D Displaced intertrochanteric fracture of right femur, subsequent encounter for closed fracture with routine healing: Secondary | ICD-10-CM | POA: Diagnosis not present

## 2018-09-27 DIAGNOSIS — W19XXXD Unspecified fall, subsequent encounter: Secondary | ICD-10-CM | POA: Diagnosis not present

## 2018-09-27 DIAGNOSIS — I10 Essential (primary) hypertension: Secondary | ICD-10-CM | POA: Diagnosis not present

## 2018-09-27 DIAGNOSIS — G510 Bell's palsy: Secondary | ICD-10-CM | POA: Diagnosis not present

## 2018-09-27 DIAGNOSIS — Z7901 Long term (current) use of anticoagulants: Secondary | ICD-10-CM | POA: Diagnosis not present

## 2018-09-27 DIAGNOSIS — F411 Generalized anxiety disorder: Secondary | ICD-10-CM | POA: Diagnosis not present

## 2018-10-03 DIAGNOSIS — G510 Bell's palsy: Secondary | ICD-10-CM | POA: Diagnosis not present

## 2018-10-03 DIAGNOSIS — K5900 Constipation, unspecified: Secondary | ICD-10-CM | POA: Diagnosis not present

## 2018-10-03 DIAGNOSIS — Z7901 Long term (current) use of anticoagulants: Secondary | ICD-10-CM | POA: Diagnosis not present

## 2018-10-03 DIAGNOSIS — S72141D Displaced intertrochanteric fracture of right femur, subsequent encounter for closed fracture with routine healing: Secondary | ICD-10-CM | POA: Diagnosis not present

## 2018-10-03 DIAGNOSIS — I10 Essential (primary) hypertension: Secondary | ICD-10-CM | POA: Diagnosis not present

## 2018-10-03 DIAGNOSIS — F411 Generalized anxiety disorder: Secondary | ICD-10-CM | POA: Diagnosis not present

## 2018-10-03 DIAGNOSIS — W19XXXD Unspecified fall, subsequent encounter: Secondary | ICD-10-CM | POA: Diagnosis not present

## 2018-10-05 DIAGNOSIS — S72141D Displaced intertrochanteric fracture of right femur, subsequent encounter for closed fracture with routine healing: Secondary | ICD-10-CM | POA: Diagnosis not present

## 2018-10-10 ENCOUNTER — Telehealth: Payer: Self-pay

## 2018-10-10 DIAGNOSIS — S72141D Displaced intertrochanteric fracture of right femur, subsequent encounter for closed fracture with routine healing: Secondary | ICD-10-CM | POA: Diagnosis not present

## 2018-10-10 DIAGNOSIS — W19XXXD Unspecified fall, subsequent encounter: Secondary | ICD-10-CM | POA: Diagnosis not present

## 2018-10-10 DIAGNOSIS — K5900 Constipation, unspecified: Secondary | ICD-10-CM | POA: Diagnosis not present

## 2018-10-10 DIAGNOSIS — G510 Bell's palsy: Secondary | ICD-10-CM | POA: Diagnosis not present

## 2018-10-10 DIAGNOSIS — I10 Essential (primary) hypertension: Secondary | ICD-10-CM | POA: Diagnosis not present

## 2018-10-10 DIAGNOSIS — Z7901 Long term (current) use of anticoagulants: Secondary | ICD-10-CM | POA: Diagnosis not present

## 2018-10-10 DIAGNOSIS — F411 Generalized anxiety disorder: Secondary | ICD-10-CM | POA: Diagnosis not present

## 2018-10-10 NOTE — Telephone Encounter (Signed)
Copied from [place] [phone]. Topic: General - Other >> Oct 10, 2018  9:41 AM Rainey Pines A wrote: Patient home health nurse called to inform Dr Ancil Boozer that thas refused her most recent home health visit and has been refusing a lot of her home health visits.  [phone]

## 2018-10-12 DIAGNOSIS — S72141D Displaced intertrochanteric fracture of right femur, subsequent encounter for closed fracture with routine healing: Secondary | ICD-10-CM | POA: Insufficient documentation

## 2018-10-15 ENCOUNTER — Telehealth: Payer: Self-pay | Admitting: Family Medicine

## 2018-10-15 NOTE — Telephone Encounter (Signed)
Home Health Verbal Orders - Caller/Agency: Costella Hatcher with [facility] Number: [phone] Requesting OT/PT/Skilled Nursing/Social Work/Speech Therapy: Need verbals to Continue PT   Frequency: 1 week 3 starting this week

## 2018-10-15 NOTE — Telephone Encounter (Signed)
Called Costella Hatcher to give him approval for PT.

## 2018-10-29 ENCOUNTER — Telehealth: Payer: Self-pay | Admitting: Family Medicine

## 2018-10-29 NOTE — Telephone Encounter (Signed)
Cesar from [facility] called and stated that She will discharge Patient from PT due to Patient refusing visits and not answering phone or calling back. Pt has a lot of missed visits and doesn't seem interested

## 2018-10-30 DIAGNOSIS — Z8781 Personal history of (healed) traumatic fracture: Secondary | ICD-10-CM | POA: Diagnosis not present

## 2018-10-30 DIAGNOSIS — Z9889 Other specified postprocedural states: Secondary | ICD-10-CM | POA: Diagnosis not present

## 2018-10-30 DIAGNOSIS — S72141D Displaced intertrochanteric fracture of right femur, subsequent encounter for closed fracture with routine healing: Secondary | ICD-10-CM | POA: Diagnosis not present

## 2018-11-13 ENCOUNTER — Telehealth: Payer: Self-pay

## 2018-11-13 ENCOUNTER — Other Ambulatory Visit: Payer: Self-pay

## 2018-11-13 ENCOUNTER — Ambulatory Visit (INDEPENDENT_AMBULATORY_CARE_PROVIDER_SITE_OTHER): Payer: Medicare PPO | Admitting: Family Medicine

## 2018-11-13 ENCOUNTER — Encounter: Payer: Self-pay | Admitting: Family Medicine

## 2018-11-13 VITALS — BP 118/67

## 2018-11-13 DIAGNOSIS — E785 Hyperlipidemia, unspecified: Secondary | ICD-10-CM

## 2018-11-13 DIAGNOSIS — E871 Hypo-osmolality and hyponatremia: Secondary | ICD-10-CM | POA: Diagnosis not present

## 2018-11-13 DIAGNOSIS — I10 Essential (primary) hypertension: Secondary | ICD-10-CM | POA: Diagnosis not present

## 2018-11-13 DIAGNOSIS — D692 Other nonthrombocytopenic purpura: Secondary | ICD-10-CM

## 2018-11-13 DIAGNOSIS — D509 Iron deficiency anemia, unspecified: Secondary | ICD-10-CM | POA: Diagnosis not present

## 2018-11-13 DIAGNOSIS — Z8781 Personal history of (healed) traumatic fracture: Secondary | ICD-10-CM | POA: Diagnosis not present

## 2018-11-13 NOTE — Progress Notes (Signed)
Name: Sandra [name]   MRN: [id number]    DOB: 09/01/1940   Date:11/13/2018       Progress Note  Subjective  Chief Complaint  Chief Complaint  Patient presents with  . Follow-up    6 Month Follow Up    I connected with  Sandra [name] on 11/13/18 at 11:00 AM EDT by telephone and verified that I am speaking with the correct person using two identifiers.  I discussed the limitations, risks, security and privacy concerns of performing an evaluation and management service by telephone and the availability of in person appointments. Staff also discussed with the patient that there may be a patient responsible charge related to this service. Patient Location: at home  [facility]   HPI  History of fracture of right hip: she states I had told her to remove the rugs from her house but she didn't listen, she got up at night and tripped on a rug and fell, fractured her right hip, she is still using a walker but she has been recovering   Anemia iron deficiency: she had a blood transfusion after hip surgery , advised to come in for labs   Senile purpura: she states still bruises, but not as bad as it was   HTN: she is taking mediation as prescribed (lisinopril 10mg ), bpwas at goal at last visit, denies any dizziness, chest pain, shortness of breath, BLE edema, or palpitation at this time. She states this am bp 118/67, advised to try going down to 5 mg daily to keep bp around 130   Dyslipidemia: not interested in medications or having labs done, discussed healthy diet.  Take 81mg  ASA every other day. Unchanged   Hyponatremia : last level wasdone 6 months ago and was 130.  This has been ongoing for 2 years and has remained stable.  Recommend recheck at follow up visit in 6 months. She states that she drinks 32 ounces of water daily. No headaches ,no longer having dizziness, no muscle tremor or irritability   Patient Active Problem List   Diagnosis Date Noted  . Hip fracture (HCC) 08/19/2018  . Hiatal hernia 10/27/2014  . Hypertension, benign 10/27/2014  . Female pattern hair loss 10/27/2014  . Nocturia 10/27/2014  . Left-sided Bell's palsy 10/27/2014  . Generalized anxiety disorder 10/27/2014  . Senile purpura (HCC) 10/27/2014    Past Surgical History:  Procedure Laterality Date  . ABDOMINAL HYSTERECTOMY    . INTRAMEDULLARY (IM) NAIL INTERTROCHANTERIC Right 08/19/2018   Procedure: INTRAMEDULLARY (IM) NAIL INTERTROCHANTRIC;  Surgeon: Karleen HampshireNappo, Kyle E, MD;  Location: [facility] ORS;  Service: Orthopedics;  Laterality: Right;  . MULTIPLE TOOTH EXTRACTIONS N/A    patient had all of her bottom teeth pulled about 2-3 weeks ago.  . TONSILLECTOMY      Family History  Problem Relation Age of Onset  . Hypertension Daughter   . Hypertension Sister   . Hypertension Brother     Social History   Socioeconomic History  . Marital status: Widowed    Spouse name: Not on file  . Number of children: 2  . Years of education: 7612  . Highest education level: High school graduate  Occupational History  . Not on file  Social Needs  . Financial resource strain: Not hard at all  . Food insecurity    Worry: Never true    Inability: Never true  . Transportation needs    Medical: No    Non-medical: No  Tobacco  Use  . Smoking status: Never Smoker  . Smokeless tobacco: Never Used  Substance and Sexual Activity  . Alcohol use: No    Alcohol/week: 0.0 standard drinks  . Drug use: No  . Sexual activity: Not Currently  Lifestyle  . Physical activity    Days per week: 3 days    Minutes per session: 60 min  . Stress: Not at all  Relationships  . Social connections    Talks on phone: More than three times a week    Gets together: Twice a week    Attends religious service: More than 4 times per year    Active member of club or organization: No    Attends meetings of clubs or organizations: Never    Relationship status: Widowed  .  Intimate partner violence    Fear of current or ex partner: No    Emotionally abused: No    Physically abused: No    Forced sexual activity: No  Other Topics Concern  . 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      Current Outpatient Medications:  .  aspirin 81 MG tablet, Take 81 mg by mouth every other day. , Disp: , Rfl:  .  Cholecalciferol (VITAMIN D3) 50 MCG (2000 UT) capsule, Take 2,000 Units by mouth daily., Disp: , Rfl:  .  lisinopril (ZESTRIL) 10 MG tablet, Take 1 tablet (10 mg total) by mouth daily., Disp: 90 tablet, Rfl: 2 .  Multiple Vitamin (MULTIVITAMIN) tablet, Take 1 tablet by mouth daily., Disp: , Rfl:  .  alum & mag hydroxide-simeth (MAALOX/MYLANTA) 200-200-20 MG/5ML suspension, Take 30 mLs by mouth every 6 (six) hours as needed for indigestion or heartburn. (Patient not taking: Reported on 11/13/2018), Disp: 355 mL, Rfl: 0 .  bisacodyl (DULCOLAX) 10 MG suppository, Place 1 suppository (10 mg total) rectally daily as needed for moderate constipation. (Patient not taking: Reported on 09/17/2018), Disp: 12 suppository, Rfl: 0 .  docusate sodium (COLACE) 100 MG capsule, Take 1 capsule (100 mg total) by mouth 2 (two) times daily. (Patient not taking: Reported on 11/13/2018), Disp: 10 capsule, Rfl: 0 .  enoxaparin (LOVENOX) 40 MG/0.4ML injection, Inject 0.4 mLs (40 mg total) into the skin daily for 14 doses., Disp: 5.6 mL, Rfl: 0 .  magnesium hydroxide (MILK OF MAGNESIA) 400 MG/5ML suspension, Take 15 mLs by mouth at bedtime as needed for mild constipation. (Patient not taking: Reported on 11/13/2018), Disp: 355 mL, Rfl: 0 .  oxyCODONE-acetaminophen (PERCOCET/ROXICET) 5-325 MG tablet, Take 1 tablet by mouth every 6 (six) hours as needed for moderate pain. (Patient not taking: Reported on 09/17/2018), Disp: 30 tablet, Rfl: 0  No  Known Allergies  I personally reviewed active problem list, medication list, allergies, family history, social history, health maintenance with the patient/caregiver today.   ROS  Ten systems reviewed and is negative except as mentioned in HPI   Objective  Virtual at home  Vitals:   11/13/18 0959  BP: 118/67    There is no height or weight on file to calculate BMI.  Physical Exam  Awake, alert and oriented   PHQ2/9: Depression screen [facility] 2/9 11/13/2018 05/14/2018 11/14/2017 11/14/2017 05/16/2017  Decreased Interest 0 0 0 0 0  Down, Depressed, Hopeless 0 0 0 0 0  PHQ - 2  Score 0 0 0 0 0  Altered sleeping 0 0 0 - -  Tired, decreased energy 0 0 0 - -  Change in appetite 0 0 0 - -  Feeling bad or failure about yourself  0 0 0 - -  Trouble concentrating 0 0 0 - -  Moving slowly or fidgety/restless 0 0 0 - -  Suicidal thoughts 0 0 0 - -  PHQ-9 Score 0 0 0 - -  Difficult doing work/chores Not difficult at all Not difficult at all Not difficult at all - -   PHQ-2/9 Result is negative.    Fall Risk: Fall Risk  11/13/2018 05/14/2018 11/14/2017 05/16/2017 11/08/2016  Falls in the past year? 1 0 No No No  Number falls in past yr: 0 0 - - -  Injury with Fall? 1 0 - - -  Follow up - Falls evaluation completed - - -     Assessment & Plan  1. Hyponatremia  She needs to come in for labs   2. Dyslipidemia  - Lipid panel  3. Senile purpura ([place])  Stable and she was given reassurance   4. Hypertension, benign  Advised to go down to 5 mg daily and monitor , she will cut her current pills in half for now - COMPLETE METABOLIC PANEL WITH GFR  5. Iron deficiency anemia, unspecified iron deficiency anemia type  - Iron, TIBC and Ferritin Panel - CBC with Differential/Platelet  6. History of fracture of right hip  - VITAMIN D 25 Hydroxy (Vit-D Deficiency, Fractures) - Parathyroid hormone, intact (no Ca)   I discussed the assessment and treatment plan with the patient.  The patient was provided an opportunity to ask questions and all were answered. The patient agreed with the plan and demonstrated an understanding of the instructions.   The patient was advised to call back or seek an in-person evaluation if the symptoms worsen or if the condition fails to improve as anticipated.  I provided 25 minutes of non-face-to-face time during this encounter.  Loistine Chance, MD

## 2018-11-13 NOTE — Telephone Encounter (Signed)
Patient wants to schedule an appt.

## 2018-11-13 NOTE — Telephone Encounter (Signed)
Copied from [place] [id number]. Topic: General - Call Back - No Documentation >> Nov 13, 2018  3:05 PM Erick Blinks wrote: Nurse call back request  Best contact: [phone]

## 2018-11-13 NOTE — Telephone Encounter (Signed)
Spoke with pt and appt scheduled

## 2018-12-07 ENCOUNTER — Telehealth: Payer: Self-pay

## 2018-12-07 NOTE — Telephone Encounter (Signed)
Copied from [place] [id number]. Topic: General - Other >> Dec 07, 2018 12:09 PM Sheran Luz wrote: Patient requesting call from [place] to discuss hypertension. Patient asked what hypertension meant. She states she would only like to speak with RN or CMA.

## 2018-12-10 ENCOUNTER — Telehealth: Payer: Self-pay

## 2018-12-10 NOTE — Telephone Encounter (Signed)
Pt called back in to check the status and she told me that her bp has been running about the same 119/69.

## 2018-12-10 NOTE — Telephone Encounter (Signed)
Copied from [place] [id number]. Topic: General - Inquiry >> Dec 10, 2018 10:08 AM Percell Belt A wrote: Reason for CRM: pt called in stated that her lisinopril (ZESTRIL) 10 MG tablet [[id number]]  was reduced to 5Mg  on last visit, she would like to know if dr can call in new script so she does not have to cut the tab in 1/2 [place]

## 2018-12-12 ENCOUNTER — Other Ambulatory Visit: Payer: Self-pay | Admitting: Family Medicine

## 2018-12-12 DIAGNOSIS — I10 Essential (primary) hypertension: Secondary | ICD-10-CM

## 2018-12-12 MED ORDER — LISINOPRIL 2.5 MG PO TABS: 2.5000 mg | ORAL_TABLET | Freq: Every day | ORAL | 0 refills | Status: DC

## 2018-12-12 NOTE — Telephone Encounter (Signed)
Patient notified to pick up new prescription of Lisinopril 2.5 mg to keep her BP around 130/80. Patinet verbalized understanding.

## 2018-12-14 NOTE — Telephone Encounter (Signed)
Patient is requesting a call back regarding this medication. She states she is concerned about the possible side effects listed on this insert.

## 2018-12-14 NOTE — Telephone Encounter (Signed)
Patient calling again, requesting to speak with nurse or medical assistant. Office now closed. Routing to NT as patient would like to speak with someone today with questions/concerns.

## 2018-12-17 NOTE — Telephone Encounter (Signed)
Patient states son went to the pharmacy and picked up new prescription and not having any trouble with her BP with lower dosage.

## 2019-01-22 DIAGNOSIS — Z9889 Other specified postprocedural states: Secondary | ICD-10-CM | POA: Diagnosis not present

## 2019-01-22 DIAGNOSIS — Z8781 Personal history of (healed) traumatic fracture: Secondary | ICD-10-CM | POA: Diagnosis not present

## 2019-01-22 DIAGNOSIS — S72141D Displaced intertrochanteric fracture of right femur, subsequent encounter for closed fracture with routine healing: Secondary | ICD-10-CM | POA: Diagnosis not present

## 2019-01-22 HISTORY — PX: HIP SURGERY: SHX245

## 2019-02-14 ENCOUNTER — Ambulatory Visit (INDEPENDENT_AMBULATORY_CARE_PROVIDER_SITE_OTHER): Payer: Medicare PPO

## 2019-02-14 ENCOUNTER — Other Ambulatory Visit: Payer: Self-pay

## 2019-02-14 VITALS — Temp 97.6°F

## 2019-02-14 DIAGNOSIS — Z0000 Encounter for general adult medical examination without abnormal findings: Secondary | ICD-10-CM | POA: Diagnosis not present

## 2019-02-14 NOTE — Progress Notes (Signed)
Subjective:   Sandra [name] is a 79 y.o. female who presents for Medicare Annual (Subsequent) preventive examination.  Virtual Visit via Telephone Note  I connected with Sandra [name] on 02/14/19 at  2:50 PM EST by telephone and verified that I am speaking with the correct person using two identifiers.  Medicare Annual Wellness visit completed telephonically due to Covid-19 pandemic.   Location: Patient: home Provider: office   I discussed the limitations, risks, security and privacy concerns of performing an evaluation and management service by telephone and the availability of in person appointments. The patient expressed understanding and agreed to proceed.  Some vital signs may be absent or patient reported.   [name], LPN    Review of Systems:   Cardiac Risk Factors include: advanced age (>2men, >30 women);hypertension     Objective:     Vitals: Temp 97.6 F (36.4 C)   There is no height or weight on file to calculate BMI.  Advanced Directives 02/14/2019 08/19/2018 08/19/2018 11/08/2016 05/02/2016 11/02/2015 05/04/2015  Does Patient Have a Medical Advance Directive? No No No No No No No  Would patient like information on creating a medical advance directive? No - Patient declined No - Patient declined No - Patient declined - - No - patient declined information No - patient declined information    Tobacco Social History   Tobacco Use  Smoking Status Never Smoker  Smokeless Tobacco Never Used     Counseling given: Not Answered   Clinical Intake:  Pre-visit preparation completed: Yes  Pain : No/denies pain     Nutritional Risks: None Diabetes: No  How often do you need to have someone help you when you read instructions, pamphlets, or other written materials from your doctor or pharmacy?: 1 - Never  Interpreter Needed?: No  Information entered by :: [name] LPN  Past Medical History:  Diagnosis Date  . Anxiety   . Bell's palsy   .  Changes in skin texture   . Hair loss   . Hiatal hernia   . Hypertension   . Murmur, cardiac   . Nocturia   . Ovarian failure    Past Surgical History:  Procedure Laterality Date  . ABDOMINAL HYSTERECTOMY    . HIP SURGERY  01/22/2019  . INTRAMEDULLARY (IM) NAIL INTERTROCHANTERIC Right 08/19/2018   Procedure: INTRAMEDULLARY (IM) NAIL INTERTROCHANTRIC;  Surgeon: Karleen Hampshire, MD;  Location: [facility] ORS;  Service: Orthopedics;  Laterality: Right;  . MULTIPLE TOOTH EXTRACTIONS N/A    patient had all of her bottom teeth pulled about 2-3 weeks ago.  . TONSILLECTOMY     Family History  Problem Relation Age of Onset  . Hypertension Daughter   . Hypertension Sister   . Hypertension Brother    Social History   Socioeconomic History  . Marital status: Widowed    Spouse name: Not on file  . Number of children: 2  . Years of education: 68  . Highest education level: High school graduate  Occupational History  . Not on file  Tobacco Use  . Smoking status: Never Smoker  . Smokeless tobacco: Never Used  Substance and Sexual Activity  . Alcohol use: No    Alcohol/week: 0.0 standard drinks  . Drug use: No  . Sexual activity: Not Currently  Other Topics Concern  . 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    Social Determinants of Health   Financial Resource Strain: Low Risk   . Difficulty of Paying Living Expenses: Not hard at all  Food Insecurity: No Food Insecurity  . Worried About Charity fundraiser in the Last Year: Never true  . Ran Out of Food in the Last Year: Never true  Transportation Needs: No Transportation Needs  . Lack of Transportation (Medical): No  . Lack of Transportation (Non-Medical): No  Physical Activity: Inactive  . Days of Exercise per Week: 0 days  . Minutes of Exercise per Session: 0 min  Stress: No  Stress Concern Present  . Feeling of Stress : Not at all  Social Connections: Somewhat Isolated  . Frequency of Communication with Friends and Family: More than three times a week  . Frequency of Social Gatherings with Friends and Family: Twice a week  . Attends Religious Services: More than 4 times per year  . Active Member of Clubs or Organizations: No  . Attends Archivist Meetings: Never  . Marital Status: Widowed    Outpatient Encounter Medications as of 02/14/2019  Medication Sig  . aspirin 81 MG tablet Take 81 mg by mouth every other day.   . Cholecalciferol (VITAMIN D3) 50 MCG (2000 UT) capsule Take 2,000 Units by mouth daily.  Marland Kitchen lisinopril (ZESTRIL) 2.5 MG tablet Take 1 tablet (2.5 mg total) by mouth daily.  . Multiple Vitamin (MULTIVITAMIN) tablet Take 1 tablet by mouth daily.   No facility-administered encounter medications on file as of 02/14/2019.    Activities of Daily Living In your present state of health, do you have any difficulty performing the following activities: 02/14/2019 11/13/2018  Hearing? N N  Comment declines hearing aids -  Vision? N N  Difficulty concentrating or making decisions? N N  Walking or climbing stairs? N N  Dressing or bathing? N N  Doing errands, shopping? N N  Preparing Food and eating ? N -  Using the Toilet? N -  In the past six months, have you accidently leaked urine? N -  Do you have problems with loss of bowel control? N -  Managing your Medications? N -  Managing your Finances? N -  Housekeeping or managing your Housekeeping? N -  Some recent data might be hidden    Patient Care Team: Steele Sizer, MD as PCP - General (Family Medicine)    Assessment:   This is a routine wellness examination for Sandra [name].  Exercise Activities and Dietary recommendations Current Exercise Habits: The patient does not participate in regular exercise at present, Exercise limited by: orthopedic condition(s)  Goals    . Prevent  falls     Recommend continuing physical therapy exercises at home to improve gait and mobility and prevent future falls.        Fall Risk Fall Risk  02/14/2019 11/13/2018 05/14/2018 11/14/2017 05/16/2017  Falls in the past year? 1 1 0 No No  Number falls in past yr: 1 0 0 - -  Injury with Fall? 1 1 0 - -  Risk for fall due to : Orthopedic patient;Impaired balance/gait;History of fall(s) - - - -  Follow up Falls prevention discussed - Falls evaluation completed - -   FALL RISK PREVENTION PERTAINING TO THE HOME:  Any stairs in or around the home? No  If so, do they handrails? No   Home free of loose throw rugs in walkways, pet beds, electrical cords,  etc? Yes  Adequate lighting in your home to reduce risk of falls? Yes   ASSISTIVE DEVICES UTILIZED TO PREVENT FALLS:  Life alert? No  Use of a cane, walker or w/c? Yes  Grab bars in the bathroom? Yes  Shower chair or bench in shower? Yes  Elevated toilet seat or a handicapped toilet? Yes   DME ORDERS:  DME order needed?  No   TIMED UP AND GO:  Was the test performed? No . Telephonic visit.   Education: Fall risk prevention has been discussed.  Intervention(s) required? No   Depression Screen PHQ 2/9 Scores 02/14/2019 11/13/2018 05/14/2018 11/14/2017  PHQ - 2 Score 0 0 0 0  PHQ- 9 Score - 0 0 0     Cognitive Function     6CIT Screen 02/14/2019  What Year? 0 points  What month? 0 points  What time? 0 points  Count back from 20 0 points  Months in reverse 0 points  Repeat phrase 4 points  Total Score 4    Immunization History  Administered Date(s) Administered  . Pneumococcal Conjugate-13 10/27/2014  . Pneumococcal Polysaccharide-23 11/22/2010    Qualifies for Shingles Vaccine? Yes  . Due for Shingrix. Education has been provided regarding the importance of this vaccine. Pt has been advised to call insurance company to determine out of pocket expense. Advised may also receive vaccine at local pharmacy or Health  Dept. Verbalized acceptance and understanding.  Tdap: Although this vaccine is not a covered service during a Wellness Exam, does the patient still wish to receive this vaccine today?  No .  Education has been provided regarding the importance of this vaccine. Advised may receive this vaccine at local pharmacy or Health Dept. Aware to provide a copy of the vaccination record if obtained from local pharmacy or Health Dept. Verbalized acceptance and understanding.  Flu Vaccine: Due for Flu vaccine. Does the patient want to receive this vaccine today?  No . Education has been provided regarding the importance of this vaccine but still declined. Advised may receive this vaccine at local pharmacy or Health Dept. Aware to provide a copy of the vaccination record if obtained from local pharmacy or Health Dept. Verbalized acceptance and understanding.  Pneumococcal Vaccine: Up to date    Screening Tests Health Maintenance  Topic Date Due  . INFLUENZA VACCINE  04/17/2019 (Originally 08/18/2018)  . DEXA SCAN  01/18/2020 (Originally 04/19/2003)  . TETANUS/TDAP  01/18/2020 (Originally 04/18/1957)  . PNA vac Low Risk Adult  Completed    Cancer Screenings:  Colorectal Screening: No longer required.   Mammogram:  No longer required.   Bone Density: No longer required  Lung Cancer Screening: (Low Dose CT Chest recommended if Age 75-80 years, 30 pack-year currently smoking OR have quit w/in 15years.) does not qualify.   Additional Screening:  Hepatitis C Screening: no longer required  Vision Screening: Recommended annual ophthalmology exams for early detection of glaucoma and other disorders of the eye. Is the patient up to date with their annual eye exam?  No  Who is the provider or what is the name of the office in which the pt attends annual eye exams? [facility]  Dental Screening: Recommended annual dental exams for proper oral hygiene  Community Resource Referral:  CRR required this  visit?  No      Plan:     I have personally reviewed and addressed the Medicare Annual Wellness questionnaire and have noted the following in the patient's chart:  A. Medical and social history B. Use of alcohol, tobacco or illicit drugs  C. Current medications and supplements D. Functional ability and status E.  Nutritional status F.  Physical activity G. Advance directives H. List of other physicians I.  Hospitalizations, surgeries, and ER visits in previous 12 months J.  Vitals K. Screenings such as hearing and vision if needed, cognitive and depression L. Referrals and appointments   In addition, I have reviewed and discussed with patient certain preventive protocols, quality metrics, and best practice recommendations. A written personalized care plan for preventive services as well as general preventive health recommendations were provided to patient.   SignedReather Littler, LPN Nurse Health Advisor   Nurse Notes: pt recovering from right femur fracture in August 2020, still using walker but weaning to cane and reports doing physical therapy exercises at home with no concerns at this time.

## 2019-02-14 NOTE — Patient Instructions (Signed)
Sandra [name] , Thank you for taking time to come for your Medicare Wellness Visit. I appreciate your ongoing commitment to your health goals. Please review the following plan we discussed and let me know if I can assist you in the future.   Screening recommendations/referrals: Colonoscopy: no longer required Mammogram: no longer required Bone Density: no longer required Recommended yearly ophthalmology/optometry visit for glaucoma screening and checkup Recommended yearly dental visit for hygiene and checkup  Vaccinations: Influenza vaccine: postponed Pneumococcal vaccine: done 10/27/14 Tdap vaccine: postponed Shingles vaccine: Shingrix discussed. Please contact your pharmacy for coverage information.   Advanced directives: Advance directive discussed with you today. Even though you declined this today please call our office should you change your mind and we can give you the proper paperwork for you to fill out.  Conditions/risks identified: Recommend continuing physical therapy exercises at home to improve gait and mobility  Next appointment: Please follow up in one year for your Medicare Annual Wellness visit.     Preventive Care 81 Years and Older, Female Preventive care refers to lifestyle choices and visits with your health care provider that can promote health and wellness. What does preventive care include?  A yearly physical exam. This is also called an annual well check.  Dental exams once or twice a year.  Routine eye exams. Ask your health care provider how often you should have your eyes checked.  Personal lifestyle choices, including:  Daily care of your teeth and gums.  Regular physical activity.  Eating a healthy diet.  Avoiding tobacco and drug use.  Limiting alcohol use.  Practicing safe sex.  Taking low-dose aspirin every day.  Taking vitamin and mineral supplements as recommended by your health care provider. What happens during an annual well  check? The services and screenings done by your health care provider during your annual well check will depend on your age, overall health, lifestyle risk factors, and family history of disease. Counseling  Your health care provider may ask you questions about your:  Alcohol use.  Tobacco use.  Drug use.  Emotional well-being.  Home and relationship well-being.  Sexual activity.  Eating habits.  History of falls.  Memory and ability to understand (cognition).  Work and work Astronomer.  Reproductive health. Screening  You may have the following tests or measurements:  Height, weight, and BMI.  Blood pressure.  Lipid and cholesterol levels. These may be checked every 5 years, or more frequently if you are over 81 years old.  Skin check.  Lung cancer screening. You may have this screening every year starting at age 81 if you have a 30-pack-year history of smoking and currently smoke or have quit within the past 15 years.  Fecal occult blood test (FOBT) of the stool. You may have this test every year starting at age 81.  Flexible sigmoidoscopy or colonoscopy. You may have a sigmoidoscopy every 5 years or a colonoscopy every 10 years starting at age 81.  Hepatitis C blood test.  Hepatitis B blood test.  Sexually transmitted disease (STD) testing.  Diabetes screening. This is done by checking your blood sugar (glucose) after you have not eaten for a while (fasting). You may have this done every 1-3 years.  Bone density scan. This is done to screen for osteoporosis. You may have this done starting at age 81.  Mammogram. This may be done every 1-2 years. Talk to your health care provider about how often you should have regular mammograms. Talk with your health  care provider about your test results, treatment options, and if necessary, the need for more tests. Vaccines  Your health care provider may recommend certain vaccines, such as:  Influenza vaccine. This is  recommended every year.  Tetanus, diphtheria, and acellular pertussis (Tdap, Td) vaccine. You may need a Td booster every 10 years.  Zoster vaccine. You may need this after age 81.  Pneumococcal 13-valent conjugate (PCV13) vaccine. One dose is recommended after age 81.  Pneumococcal polysaccharide (PPSV23) vaccine. One dose is recommended after age 81. Talk to your health care provider about which screenings and vaccines you need and how often you need them. This information is not intended to replace advice given to you by your health care provider. Make sure you discuss any questions you have with your health care provider. Document Released: 01/30/2015 Document Revised: 09/23/2015 Document Reviewed: 11/04/2014 Elsevier Interactive Patient Education  2017 [place] Prevention in the Home Falls can cause injuries. They can happen to people of all ages. There are many things you can do to make your home safe and to help prevent falls. What can I do on the outside of my home?  Regularly fix the edges of walkways and driveways and fix any cracks.  Remove anything that might make you trip as you walk through a door, such as a raised step or threshold.  Trim any bushes or trees on the path to your home.  Use bright outdoor lighting.  Clear any walking paths of anything that might make someone trip, such as rocks or tools.  Regularly check to see if handrails are loose or broken. Make sure that both sides of any steps have handrails.  Any raised decks and porches should have guardrails on the edges.  Have any leaves, snow, or ice cleared regularly.  Use sand or salt on walking paths during winter.  Clean up any spills in your garage right away. This includes oil or grease spills. What can I do in the bathroom?  Use night lights.  Install grab bars by the toilet and in the tub and shower. Do not use towel bars as grab bars.  Use non-skid mats or decals in the tub or  shower.  If you need to sit down in the shower, use a plastic, non-slip stool.  Keep the floor dry. Clean up any water that spills on the floor as soon as it happens.  Remove soap buildup in the tub or shower regularly.  Attach bath mats securely with double-sided non-slip rug tape.  Do not have throw rugs and other things on the floor that can make you trip. What can I do in the bedroom?  Use night lights.  Make sure that you have a light by your bed that is easy to reach.  Do not use any sheets or blankets that are too big for your bed. They should not hang down onto the floor.  Have a firm chair that has side arms. You can use this for support while you get dressed.  Do not have throw rugs and other things on the floor that can make you trip. What can I do in the kitchen?  Clean up any spills right away.  Avoid walking on wet floors.  Keep items that you use a lot in easy-to-reach places.  If you need to reach something above you, use a strong step stool that has a grab bar.  Keep electrical cords out of the way.  Do not use floor  polish or wax that makes floors slippery. If you must use wax, use non-skid floor wax.  Do not have throw rugs and other things on the floor that can make you trip. What can I do with my stairs?  Do not leave any items on the stairs.  Make sure that there are handrails on both sides of the stairs and use them. Fix handrails that are broken or loose. Make sure that handrails are as long as the stairways.  Check any carpeting to make sure that it is firmly attached to the stairs. Fix any carpet that is loose or worn.  Avoid having throw rugs at the top or bottom of the stairs. If you do have throw rugs, attach them to the floor with carpet tape.  Make sure that you have a light switch at the top of the stairs and the bottom of the stairs. If you do not have them, ask someone to add them for you. What else can I do to help prevent  falls?  Wear shoes that:  Do not have high heels.  Have rubber bottoms.  Are comfortable and fit you well.  Are closed at the toe. Do not wear sandals.  If you use a stepladder:  Make sure that it is fully opened. Do not climb a closed stepladder.  Make sure that both sides of the stepladder are locked into place.  Ask someone to hold it for you, if possible.  Clearly mark and make sure that you can see:  Any grab bars or handrails.  First and last steps.  Where the edge of each step is.  Use tools that help you move around (mobility aids) if they are needed. These include:  Canes.  Walkers.  Scooters.  Crutches.  Turn on the lights when you go into a dark area. Replace any light bulbs as soon as they burn out.  Set up your furniture so you have a clear path. Avoid moving your furniture around.  If any of your floors are uneven, fix them.  If there are any pets around you, be aware of where they are.  Review your medicines with your doctor. Some medicines can make you feel dizzy. This can increase your chance of falling. Ask your doctor what other things that you can do to help prevent falls. This information is not intended to replace advice given to you by your health care provider. Make sure you discuss any questions you have with your health care provider. Document Released: 10/30/2008 Document Revised: 06/11/2015 Document Reviewed: 02/07/2014 Elsevier Interactive Patient Education  2017 [employer].

## 2019-02-22 ENCOUNTER — Other Ambulatory Visit: Payer: Self-pay | Admitting: Family Medicine

## 2019-02-22 ENCOUNTER — Telehealth: Payer: Self-pay | Admitting: Family Medicine

## 2019-02-22 DIAGNOSIS — I10 Essential (primary) hypertension: Secondary | ICD-10-CM

## 2019-02-22 NOTE — Telephone Encounter (Signed)
Requested Prescriptions  Pending Prescriptions Disp Refills  . lisinopril (ZESTRIL) 2.5 MG tablet [Pharmacy Med Name: Lisinopril 2.5 MG Oral Tablet] 90 tablet 0    Sig: Take 1 tablet by mouth once daily     Cardiovascular:  ACE Inhibitors Failed - 02/22/2019  2:25 PM      Failed - Cr in normal range and within 180 days    Creat  Date Value Ref Range Status  11/14/2017 0.82 0.60 - 0.93 mg/dL Final    Comment:    For patients >81 years of age, the reference limit for Creatinine is approximately 13% higher for people identified as African-American. .    Creatinine, Ser  Date Value Ref Range Status  08/22/2018 0.70 0.44 - 1.00 mg/dL Final         Failed - K in normal range and within 180 days    Potassium  Date Value Ref Range Status  08/22/2018 4.0 3.5 - 5.1 mmol/L Final         Passed - Patient is not pregnant      Passed - Last BP in normal range    BP Readings from Last 1 Encounters:  11/13/18 118/67         Passed - Valid encounter within last 6 months    Recent Outpatient Visits          3 months ago Hyponatremia   [facility] Alba Cory, MD   9 months ago Hypertension, benign   [facility] Doren Custard, FNP   1 year ago Medicare annual wellness visit, subsequent   [facility] Alba Cory, MD   1 year ago Hypertension, benign   [facility] Alba Cory, MD   2 years ago Medicare annual wellness visit, subsequent   [facility] Alba Cory, MD      Future Appointments            In 2 months Carlynn Purl, Danna Hefty, MD [facility]   In 12 months  [facility]

## 2019-02-22 NOTE — Telephone Encounter (Signed)
RX REFILL lisinopril (ZESTRIL) 2.5 MG tablet  [facility] [address] [place] - [address]  [address] [place] [address]  Phone:  [phone] Fax:  [phone]

## 2019-02-22 NOTE — Telephone Encounter (Signed)
Patient is calling back to check on the status of her medication request please advise CB- [phone]

## 2019-02-22 NOTE — Telephone Encounter (Signed)
Patient called to let Dr. Carlynn Purl know that she accidentally dropped medication in the toilet and would like a callback from nurse in regards to refilling lisinopril medication.

## 2019-02-22 NOTE — Telephone Encounter (Signed)
Informed patient to call her Pharmacy and explain what happened and see if they can do a early refill for her.

## 2019-05-14 ENCOUNTER — Ambulatory Visit: Payer: Medicare PPO | Admitting: Family Medicine

## 2019-05-14 ENCOUNTER — Encounter: Payer: Self-pay | Admitting: Family Medicine

## 2019-05-14 ENCOUNTER — Other Ambulatory Visit: Payer: Self-pay

## 2019-05-14 VITALS — BP 140/72 | HR 88 | Temp 97.3°F | Resp 16 | Ht 61.0 in | Wt 137.5 lb

## 2019-05-14 DIAGNOSIS — D692 Other nonthrombocytopenic purpura: Secondary | ICD-10-CM

## 2019-05-14 DIAGNOSIS — D509 Iron deficiency anemia, unspecified: Secondary | ICD-10-CM | POA: Insufficient documentation

## 2019-05-14 DIAGNOSIS — Z8781 Personal history of (healed) traumatic fracture: Secondary | ICD-10-CM | POA: Diagnosis not present

## 2019-05-14 DIAGNOSIS — E871 Hypo-osmolality and hyponatremia: Secondary | ICD-10-CM

## 2019-05-14 DIAGNOSIS — E785 Hyperlipidemia, unspecified: Secondary | ICD-10-CM | POA: Diagnosis not present

## 2019-05-14 DIAGNOSIS — M810 Age-related osteoporosis without current pathological fracture: Secondary | ICD-10-CM

## 2019-05-14 DIAGNOSIS — I10 Essential (primary) hypertension: Secondary | ICD-10-CM

## 2019-05-14 MED ORDER — LISINOPRIL 2.5 MG PO TABS: 2.5000 mg | ORAL_TABLET | Freq: Every day | ORAL | 1 refills | Status: DC

## 2019-05-14 MED ORDER — ALENDRONATE SODIUM 70 MG PO TABS: 70.0000 mg | ORAL_TABLET | ORAL | 3 refills | Status: DC

## 2019-05-14 NOTE — Progress Notes (Signed)
Name: Sandra [name]   MRN: [id number]    DOB: 12-14-1940   Date:05/14/2019       Progress Note  Subjective  Chief Complaint  Chief Complaint  Patient presents with  . Hypertension  . Anemia  . Dyslipidemia    HPI   History of fracture of right hip: she states I had told her to remove the rugs from her house but she didn't listen, she got up at night and tripped on a rug and fell, fractured her right hip, she is still using a walker but she has been recovering Discussed bone density screen or starting therapy, she chose the later.   Anemia iron deficiency: she had a blood transfusion after hip surgery ,she will have labs done today, denies pica, no SOB   Senile purpura:she states still bruises on arms intermittent, doing well today   HTN: she is down to 2.5 mg of lisinopril daily, bp at home 130's-140's, today 140/70, we will continue current dose, no chest pain or palpitation   Dyslipidemia: not interested in medications or having labs done, discussed healthy diet.Take 81mg  ASA every other day because she has senile purpura and does not like bruising so much   Hyponatremia : last level wasover 6 months ago and was 130.This has been ongoing for a few  years and has remained stable.  She states that she drinks 32 ounces of water daily. No headaches , no muscle tremor or irritability  Patient Active Problem List   Diagnosis Date Noted  . Closed displaced intertrochanteric fracture of right femur with routine healing 10/12/2018  . Age-related osteoporosis with current pathological fracture with routine healing 08/27/2018  . Hyponatremia 08/27/2018  . Hip fracture (HCC) 08/19/2018  . Hiatal hernia 10/27/2014  . Hypertension, benign 10/27/2014  . Female pattern hair loss 10/27/2014  . Nocturia 10/27/2014  . Left-sided Bell's palsy 10/27/2014  . Generalized anxiety disorder 10/27/2014  . Senile purpura (HCC) 10/27/2014    Past Surgical History:  Procedure  Laterality Date  . ABDOMINAL HYSTERECTOMY    . HIP SURGERY  01/22/2019  . INTRAMEDULLARY (IM) NAIL INTERTROCHANTERIC Right 08/19/2018   Procedure: INTRAMEDULLARY (IM) NAIL INTERTROCHANTRIC;  Surgeon: 10/19/2018, MD;  Location: [facility] ORS;  Service: Orthopedics;  Laterality: Right;  . MULTIPLE TOOTH EXTRACTIONS N/A    patient had all of her bottom teeth pulled about 2-3 weeks ago.  . TONSILLECTOMY      Family History  Problem Relation Age of Onset  . Hypertension Daughter   . Hypertension Sister   . Hypertension Brother     Social History   Tobacco Use  . Smoking status: Never Smoker  . Smokeless tobacco: Never Used  Substance Use Topics  . Alcohol use: No    Alcohol/week: 0.0 standard drinks     Current Outpatient Medications:  .  aspirin 81 MG tablet, Take 81 mg by mouth every other day. , Disp: , Rfl:  .  Cholecalciferol (VITAMIN D3) 50 MCG (2000 UT) capsule, Take 2,000 Units by mouth daily., Disp: , Rfl:  .  lisinopril (ZESTRIL) 2.5 MG tablet, Take 1 tablet by mouth once daily, Disp: 90 tablet, Rfl: 0 .  Multiple Vitamin (MULTIVITAMIN) tablet, Take 1 tablet by mouth daily., Disp: , Rfl:   No Known Allergies  I personally reviewed active problem list, medication list, allergies, family history, social history, health maintenance with the patient/caregiver today.   ROS  Constitutional: Negative for fever or weight change.  Respiratory: Negative for cough  and shortness of breath.   Cardiovascular: Negative for chest pain or palpitations.  Gastrointestinal: Negative for abdominal pain, no bowel changes.  Musculoskeletal: Positive  for gait problem but no  joint swelling.  Skin: Negative for rash.  Neurological: Negative for dizziness or headache.  No other specific complaints in a complete review of systems (except as listed in HPI above).  Objective  Vitals:   05/14/19 1033  BP: 140/72  Pulse: 88  Resp: 16  Temp: (!) 97.3 F (36.3 C)  TempSrc: Temporal   SpO2: 98%  Weight: 137 lb 8 oz (62.4 kg)  Height: 5\' 1"  (1.549 m)    Body mass index is 25.98 kg/m.  Physical Exam  Constitutional: Patient appears well-developed and well-nourished. No distress.  HEENT: head atraumatic, normocephalic, pupils equal and reactive to light Cardiovascular: Normal rate, regular rhythm and normal heart sounds.  No murmur heard. No BLE edema. Pulmonary/Chest: Effort normal and breath sounds normal. No respiratory distress. Abdominal: Soft.  There is no tenderness. Psychiatric: Patient has a normal mood and affect. behavior is normal. Judgment and thought content normal. Muscular skeletal: walking slowly, using cane  PHQ2/9: Depression screen [facility] 2/9 05/14/2019 02/14/2019 11/13/2018 05/14/2018 11/14/2017  Decreased Interest 0 0 0 0 0  Down, Depressed, Hopeless 0 0 0 0 0  PHQ - 2 Score 0 0 0 0 0  Altered sleeping 0 - 0 0 0  Tired, decreased energy 0 - 0 0 0  Change in appetite 0 - 0 0 0  Feeling bad or failure about yourself  0 - 0 0 0  Trouble concentrating 0 - 0 0 0  Moving slowly or fidgety/restless 0 - 0 0 0  Suicidal thoughts 0 - 0 0 0  PHQ-9 Score 0 - 0 0 0  Difficult doing work/chores - - Not difficult at all Not difficult at all Not difficult at all    phq 9 is negative    Fall Risk: Fall Risk  02/14/2019 11/13/2018 05/14/2018 11/14/2017 05/16/2017  Falls in the past year? 1 1 0 No No  Number falls in past yr: 1 0 0 - -  Injury with Fall? 1 1 0 - -  Risk for fall due to : Orthopedic patient;Impaired balance/gait;History of fall(s) - - - -  Follow up Falls prevention discussed - Falls evaluation completed - -      Functional Status Survey: Is the patient deaf or have difficulty hearing?: No Does the patient have difficulty seeing, even when wearing glasses/contacts?: No Does the patient have difficulty concentrating, remembering, or making decisions?: No Does the patient have difficulty walking or climbing stairs?: No Does the patient  have difficulty dressing or bathing?: No Does the patient have difficulty doing errands alone such as visiting a doctor's office or shopping?: No    Assessment & Plan  1. Dyslipidemia  Not interested on medication  2. Hypertension, benign  - lisinopril (ZESTRIL) 2.5 MG tablet; Take 1 tablet (2.5 mg total) by mouth daily.  Dispense: 90 tablet; Refill: 1  3. Senile purpura (HCC)  stable  4. Hyponatremia  Recheck levels today   5. Iron deficiency anemia, unspecified iron deficiency anemia type  Recheck labs   6. History of fracture of right hip  Still using a cane  7. Age-related osteoporosis without current pathological fracture  - alendronate (FOSAMAX) 70 MG tablet; Take 1 tablet (70 mg total) by mouth every 7 (seven) days. Take with a full glass of water on an empty stomach.  Dispense: 12 tablet; Refill: 3

## 2019-05-15 LAB — IRON,TIBC AND FERRITIN PANEL
%SAT: 25 % (calc) (ref 16–45)
Ferritin: 48 ng/mL (ref 16–288)
Iron: 77 ug/dL (ref 45–160)
TIBC: 308 mcg/dL (calc) (ref 250–450)

## 2019-05-15 LAB — CBC WITH DIFFERENTIAL/PLATELET
Absolute Monocytes: 802 cells/uL (ref 200–950)
Basophils Absolute: 49 cells/uL (ref 0–200)
Basophils Relative: 0.6 %
Eosinophils Absolute: 41 cells/uL (ref 15–500)
Eosinophils Relative: 0.5 %
HCT: 40.1 % (ref 35.0–45.0)
Hemoglobin: 13.2 g/dL (ref 11.7–15.5)
Lymphs Abs: 1871 cells/uL (ref 850–3900)
MCH: 30.5 pg (ref 27.0–33.0)
MCHC: 32.9 g/dL (ref 32.0–36.0)
MCV: 92.6 fL (ref 80.0–100.0)
MPV: 10.4 fL (ref 7.5–12.5)
Monocytes Relative: 9.9 %
Neutro Abs: 5338 cells/uL (ref 1500–7800)
Neutrophils Relative %: 65.9 %
Platelets: 240 10*3/uL (ref 140–400)
RBC: 4.33 10*6/uL (ref 3.80–5.10)
RDW: 11.9 % (ref 11.0–15.0)
Total Lymphocyte: 23.1 %
WBC: 8.1 10*3/uL (ref 3.8–10.8)

## 2019-05-15 LAB — LIPID PANEL
Cholesterol: 194 mg/dL (ref <200)
HDL: 63 mg/dL (ref >=50)
LDL Cholesterol (Calc): 116 mg/dL (calc) — ABNORMAL HIGH
Non-HDL Cholesterol (Calc): 131 mg/dL (calc) — ABNORMAL HIGH (ref <130)
Total CHOL/HDL Ratio: 3.1 (calc) (ref <5.0)
Triglycerides: 60 mg/dL (ref <150)

## 2019-05-15 LAB — COMPLETE METABOLIC PANEL WITH GFR
AG Ratio: 1.6 (calc) (ref 1.0–2.5)
ALT: 12 U/L (ref 6–29)
AST: 16 U/L (ref 10–35)
Albumin: 4.2 g/dL (ref 3.6–5.1)
Alkaline phosphatase (APISO): 77 U/L (ref 37–153)
BUN: 15 mg/dL (ref 7–25)
CO2: 26 mmol/L (ref 20–32)
Calcium: 9.6 mg/dL (ref 8.6–10.4)
Chloride: 100 mmol/L (ref 98–110)
Creat: 0.78 mg/dL (ref 0.60–0.88)
GFR, Est African American: 83 mL/min/{1.73_m2} (ref >=60)
GFR, Est Non African American: 71 mL/min/{1.73_m2} (ref >=60)
Globulin: 2.7 g/dL (calc) (ref 1.9–3.7)
Glucose, Bld: 88 mg/dL (ref 65–99)
Potassium: 4.3 mmol/L (ref 3.5–5.3)
Sodium: 133 mmol/L — ABNORMAL LOW (ref 135–146)
Total Bilirubin: 0.5 mg/dL (ref 0.2–1.2)
Total Protein: 6.9 g/dL (ref 6.1–8.1)

## 2019-05-15 LAB — PARATHYROID HORMONE, INTACT (NO CA): PTH: 30 pg/mL (ref 14–64)

## 2019-05-15 LAB — VITAMIN D 25 HYDROXY (VIT D DEFICIENCY, FRACTURES): Vit D, 25-Hydroxy: 35 ng/mL (ref 30–100)

## 2019-11-13 ENCOUNTER — Telehealth (INDEPENDENT_AMBULATORY_CARE_PROVIDER_SITE_OTHER): Payer: Medicare PPO | Admitting: Family Medicine

## 2019-11-13 ENCOUNTER — Encounter: Payer: Self-pay | Admitting: Family Medicine

## 2019-11-13 VITALS — BP 120/73 | Ht 61.0 in | Wt 137.0 lb

## 2019-11-13 DIAGNOSIS — I10 Essential (primary) hypertension: Secondary | ICD-10-CM

## 2019-11-13 DIAGNOSIS — E785 Hyperlipidemia, unspecified: Secondary | ICD-10-CM

## 2019-11-13 DIAGNOSIS — Z8781 Personal history of (healed) traumatic fracture: Secondary | ICD-10-CM | POA: Diagnosis not present

## 2019-11-13 DIAGNOSIS — D692 Other nonthrombocytopenic purpura: Secondary | ICD-10-CM | POA: Diagnosis not present

## 2019-11-13 DIAGNOSIS — M810 Age-related osteoporosis without current pathological fracture: Secondary | ICD-10-CM

## 2019-11-13 DIAGNOSIS — E871 Hypo-osmolality and hyponatremia: Secondary | ICD-10-CM | POA: Diagnosis not present

## 2019-11-13 MED ORDER — LISINOPRIL 2.5 MG PO TABS: 2.5000 mg | ORAL_TABLET | Freq: Every day | ORAL | 1 refills | Status: DC

## 2019-11-13 NOTE — Progress Notes (Signed)
Name: Sandra [name]   MRN: [id number]    DOB: 03/31/1940   Date:11/13/2019       Progress Note  Subjective  Chief Complaint  Chief Complaint  Patient presents with  . Follow-up    dyslipidemia    I connected with  [place] P Hegner  on 11/13/19 at 10:00 AM EDT by a video enabled telemedicine application and verified that I am speaking with the correct person using two identifiers.  I discussed the limitations of evaluation and management by telemedicine and the availability of in person appointments. The patient expressed understanding and agreed to proceed. Staff also discussed with the patient that there may be a patient responsible charge related to this service.She agrees on doing it as a virtual visit  Patient Location: at grand-daughters house  Provider Location: [facility]   HPI   History of fracture of right hip: it happened in 08/2018 she has been doing well now, only using a cane when out of the house. She no longer has loose rugs at her house   Senile purpura:she states still bruises on arms intermittent, but currently only taking aspirin every other day, explained she can stop taking aspirin based on new studies   HTN: she is down to 2.5 mg of lisinopril daily, bp at home 120's-130's we will continue current dose, no chest pain, lor palpitation . She states occasionally has right lower extremity edema when very active (since hip fracture)   Dyslipidemia: not interested in medications, discussed healthy diet. Unchanged   Hyponatremia : last level wasover 6 months ago and was 133.This has been ongoing for a few  years and has remained stable.  She states that she drinks 32 ounces of water daily. No headaches , no muscle tremor or irritability. Denies dizziness   Osteoporosis: she never started Alendronate, only taking vitamin D otc , discussed high calcium diet   Patient Active Problem List   Diagnosis Date Noted  . History of fracture of right hip 05/14/2019   . Iron deficiency anemia 05/14/2019  . Dyslipidemia 05/14/2019  . Closed displaced intertrochanteric fracture of right femur with routine healing 10/12/2018  . Age-related osteoporosis without current pathological fracture 08/27/2018  . Hyponatremia 08/27/2018  . Hip fracture (HCC) 08/19/2018  . Hiatal hernia 10/27/2014  . Hypertension, benign 10/27/2014  . Female pattern hair loss 10/27/2014  . Nocturia 10/27/2014  . Left-sided Bell's palsy 10/27/2014  . Generalized anxiety disorder 10/27/2014  . Senile purpura (HCC) 10/27/2014    Past Surgical History:  Procedure Laterality Date  . ABDOMINAL HYSTERECTOMY    . HIP SURGERY  01/22/2019  . INTRAMEDULLARY (IM) NAIL INTERTROCHANTERIC Right 08/19/2018   Procedure: INTRAMEDULLARY (IM) NAIL INTERTROCHANTRIC;  Surgeon: Karleen Hampshire, MD;  Location: [facility] ORS;  Service: Orthopedics;  Laterality: Right;  . MULTIPLE TOOTH EXTRACTIONS N/A    patient had all of her bottom teeth pulled about 2-3 weeks ago.  . TONSILLECTOMY      Family History  Problem Relation Age of Onset  . Hypertension Daughter   . Hypertension Sister   . Hypertension Brother     Social History   Socioeconomic History  . Marital status: Widowed    Spouse name: Not on file  . Number of children: 2  . Years of education: 26  . Highest education level: High school graduate  Occupational History  . Not on file  Tobacco Use  . Smoking status: Never Smoker  . Smokeless tobacco: Never Used  Vaping Use  .  Vaping Use: Never used  Substance and Sexual Activity  . Alcohol use: No    Alcohol/week: 0.0 standard drinks  . Drug use: No  . Sexual activity: Not Currently  Other Topics Concern  . 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    Social Determinants of Health   Financial Resource Strain: Low Risk   .  Difficulty of Paying Living Expenses: Not hard at all  Food Insecurity: No Food Insecurity  . Worried About Programme researcher, broadcasting/film/video in the Last Year: Never true  . Ran Out of Food in the Last Year: Never true  Transportation Needs: No Transportation Needs  . Lack of Transportation (Medical): No  . Lack of Transportation (Non-Medical): No  Physical Activity: Inactive  . Days of Exercise per Week: 0 days  . Minutes of Exercise per Session: 0 min  Stress: No Stress Concern Present  . Feeling of Stress : Not at all  Social Connections: Moderately Isolated  . Frequency of Communication with Friends and Family: More than three times a week  . Frequency of Social Gatherings with Friends and Family: Twice a week  . Attends Religious Services: More than 4 times per year  . Active Member of Clubs or Organizations: No  . Attends Banker Meetings: Never  . Marital Status: Widowed  Intimate Partner Violence: Not At Risk  . Fear of Current or Ex-Partner: No  . Emotionally Abused: No  . Physically Abused: No  . Sexually Abused: No     Current Outpatient Medications:  .  alendronate (FOSAMAX) 70 MG tablet, Take 1 tablet (70 mg total) by mouth every 7 (seven) days. Take with a full glass of water on an empty stomach., Disp: 12 tablet, Rfl: 3 .  aspirin 81 MG tablet, Take 81 mg by mouth every other day. , Disp: , Rfl:  .  Cholecalciferol (VITAMIN D3) 50 MCG (2000 UT) capsule, Take 2,000 Units by mouth daily., Disp: , Rfl:  .  lisinopril (ZESTRIL) 2.5 MG tablet, Take 1 tablet (2.5 mg total) by mouth daily., Disp: 90 tablet, Rfl: 1 .  Multiple Vitamin (MULTIVITAMIN) tablet, Take 1 tablet by mouth daily., Disp: , Rfl:   No Known Allergies  I personally reviewed active problem list, medication list, allergies, family history, social history, health maintenance with the patient/caregiver today.   ROS  Ten systems reviewed and is negative except as mentioned in HPI   Objective  Virtual  encounter, vitals not obtained.  Today's Vitals   11/13/19 0907 11/13/19 1006  BP:  120/73  Weight: 137 lb (62.1 kg)   Height: 5\' 1"  (1.549 m)    Body mass index is 25.89 kg/m.  Physical Exam  Awake, alert and oriented   PHQ2/9: Depression screen [facility] 2/9 05/14/2019 02/14/2019 11/13/2018 05/14/2018 11/14/2017  Decreased Interest 0 0 0 0 0  Down, Depressed, Hopeless 0 0 0 0 0  PHQ - 2 Score 0 0 0 0 0  Altered sleeping 0 - 0 0 0  Tired, decreased energy 0 - 0 0 0  Change in appetite 0 - 0 0 0  Feeling bad or failure about yourself  0 - 0 0 0  Trouble concentrating 0 - 0 0 0  Moving slowly or fidgety/restless 0 - 0 0 0  Suicidal thoughts 0 - 0 0 0  PHQ-9 Score 0 - 0 0 0  Difficult doing work/chores - - Not difficult at all Not difficult at all Not difficult at all   PHQ-2/9 Result is negative.    Fall Risk: Fall Risk  02/14/2019 11/13/2018 05/14/2018 11/14/2017 05/16/2017  Falls in the past year? 1 1 0 No No  Number falls in past yr: 1 0 0 - -  Injury with Fall? 1 1 0 - -  Risk for fall due to : Orthopedic patient;Impaired balance/gait;History of fall(s) - - - -  Follow up Falls prevention discussed - Falls evaluation completed - -     Assessment & Plan  1. Dyslipidemia  On diet only   2. Hypertension, benign  - lisinopril (ZESTRIL) 2.5 MG tablet; Take 1 tablet (2.5 mg total) by mouth daily.  Dispense: 90 tablet; Refill: 1  3. Senile purpura (HCC)  Stable  4. History of fracture of right hip  Improved  5. Hyponatremia  Reminded her to cut down on free water intake  6. Age-related osteoporosis without current pathological fracture  Refused medication  I discussed the assessment and treatment plan with the patient. The patient was provided an opportunity to ask questions and all were answered. The patient agreed with the plan and demonstrated an understanding of the instructions.  The patient was advised to call back or seek an in-person evaluation if the  symptoms worsen or if the condition fails to improve as anticipated.  I provided 25  minutes of non-face-to-face time during this encounter.

## 2020-02-18 ENCOUNTER — Ambulatory Visit: Payer: Medicare PPO

## 2020-04-14 ENCOUNTER — Ambulatory Visit: Payer: Medicare PPO

## 2020-04-21 ENCOUNTER — Ambulatory Visit (INDEPENDENT_AMBULATORY_CARE_PROVIDER_SITE_OTHER): Payer: Medicare PPO

## 2020-04-21 DIAGNOSIS — Z0000 Encounter for general adult medical examination without abnormal findings: Secondary | ICD-10-CM | POA: Diagnosis not present

## 2020-04-21 NOTE — Progress Notes (Signed)
Subjective:   Sandra [name] is a 80 y.o. female who presents for Medicare Annual (Subsequent) preventive examination.  Virtual Visit via Telephone Note  I connected with  Sandra [name] on 04/21/20 at  2:50 PM EDT by telephone and verified that I am speaking with the correct person using two identifiers.  Location: Patient: home Provider: [facility] Persons participating in the virtual visit: patient/Nurse Health Advisor   I discussed the limitations, risks, security and privacy concerns of performing an evaluation and management service by telephone and the availability of in person appointments. The patient expressed understanding and agreed to proceed.  Interactive audio and video telecommunications were attempted between this nurse and patient, however failed, due to patient having technical difficulties OR patient did not have access to video capability.  We continued and completed visit with audio only.  Some vital signs may be absent or patient reported.   Sandra Littler, LPN    Review of Systems     Cardiac Risk Factors include: advanced age (>71men, >24 women);hypertension     Objective:    There were no vitals filed for this visit. There is no height or weight on file to calculate BMI.  Advanced Directives 04/21/2020 02/14/2019 08/19/2018 08/19/2018 11/08/2016 05/02/2016 11/02/2015  Does Patient Have a Medical Advance Directive? No No No No No No No  Would patient like information on creating a medical advance directive? No - Patient declined No - Patient declined No - Patient declined No - Patient declined - - No - patient declined information    Current Medications (verified) Outpatient Encounter Medications as of 04/21/2020  Medication Sig  . Cholecalciferol (VITAMIN D3) 50 MCG (2000 UT) capsule Take 2,000 Units by mouth daily.  Sandra [name] lisinopril (ZESTRIL) 2.5 MG tablet Take 1 tablet (2.5 mg total) by mouth daily.  . Multiple Vitamin (MULTIVITAMIN) tablet Take 1 tablet by  mouth daily.   No facility-administered encounter medications on file as of 04/21/2020.    Allergies (verified) Patient has no known allergies.   History: Past Medical History:  Diagnosis Date  . Anxiety   . Bell's palsy   . Changes in skin texture   . Hair loss   . Hiatal hernia   . Hypertension   . Murmur, cardiac   . Nocturia   . Ovarian failure    Past Surgical History:  Procedure Laterality Date  . ABDOMINAL HYSTERECTOMY    . HIP SURGERY  01/22/2019  . INTRAMEDULLARY (IM) NAIL INTERTROCHANTERIC Right 08/19/2018   Procedure: INTRAMEDULLARY (IM) NAIL INTERTROCHANTRIC;  Surgeon: Sandra Hampshire, MD;  Location: [facility] ORS;  Service: Orthopedics;  Laterality: Right;  . MULTIPLE TOOTH EXTRACTIONS N/A    patient had all of her bottom teeth pulled about 2-3 weeks ago.  . TONSILLECTOMY     Family History  Problem Relation Age of Onset  . Hypertension Daughter   . Hypertension Sister   . Hypertension Brother    Social History   Socioeconomic History  . Marital status: Widowed    Spouse name: Not on file  . Number of children: 2  . Years of education: 50  . Highest education level: High school graduate  Occupational History  . Not on file  Tobacco Use  . Smoking status: Never Smoker  . Smokeless tobacco: Never Used  Vaping Use  . Vaping Use: Never used  Substance and Sexual Activity  . Alcohol use: No    Alcohol/week: 0.0 standard drinks  . Drug use: No  . Sexual  activity: Not Currently  Other Topics Concern  . 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    Social Determinants of Health   Financial Resource Strain: Low Risk   . Difficulty of Paying Living Expenses: Not hard at all  Food Insecurity: No Food Insecurity  . Worried About Programme researcher, broadcasting/film/videounning Out of Food in the Last Year: Never true  . Ran Out of Food in the  Last Year: Never true  Transportation Needs: No Transportation Needs  . Lack of Transportation (Medical): No  . Lack of Transportation (Non-Medical): No  Physical Activity: Inactive  . Days of Exercise per Week: 0 days  . Minutes of Exercise per Session: 0 min  Stress: No Stress Concern Present  . Feeling of Stress : Not at all  Social Connections: Moderately Isolated  . Frequency of Communication with Friends and Family: More than three times a week  . Frequency of Social Gatherings with Friends and Family: Twice a week  . Attends Religious Services: More than 4 times per year  . Active Member of Clubs or Organizations: No  . Attends BankerClub or Organization Meetings: Never  . Marital Status: Widowed    Tobacco Counseling Counseling given: Not Answered   Clinical Intake:  Pre-visit preparation completed: Yes  Pain : No/denies pain     Nutritional Risks: None Diabetes: No  How often do you need to have someone help you when you read instructions, pamphlets, or other written materials from your doctor or pharmacy?: 1 - Never    Interpreter Needed?: No  Information entered by :: Sandra LittlerKasey Maleek Craver LPN   Activities of Daily Living In your present state of health, do you have any difficulty performing the following activities: 04/21/2020 11/13/2019  Hearing? N N  Comment declines hearing aids -  Vision? N N  Difficulty concentrating or making decisions? N N  Walking or climbing stairs? N N  Dressing or bathing? N N  Doing errands, shopping? N Y  Quarry managerreparing Food and eating ? N -  Using the Toilet? N -  In the past six months, have you accidently leaked urine? N -  Do you have problems with loss of bowel control? N -  Managing your Medications? N -  Managing your Finances? N -  Housekeeping or managing your Housekeeping? N -  Some recent data might be hidden    Patient Care Team: Sandra [name], Krichna, MD as PCP - General (Family Medicine)  Indicate any recent Medical Services  you may have received from other than [facility] providers in the past year (date may be approximate).     Assessment:   This is a routine wellness examination for [place].  Hearing/Vision screen  Hearing Screening   125Hz  250Hz  500Hz  1000Hz  2000Hz  3000Hz  4000Hz  6000Hz  8000Hz   Right ear:           Left ear:           Comments: Pt denies hearing difficulty  Vision Screening Comments: [facility]  Dietary issues and exercise activities discussed: Current Exercise Habits: The patient does not participate in regular exercise at present, Exercise limited by: orthopedic condition(s)  Goals    . Prevent falls     Recommend continuing physical therapy exercises at home to improve gait and mobility and  prevent future falls.       Depression Screen PHQ 2/9 Scores 04/21/2020 11/13/2019 05/14/2019 02/14/2019 11/13/2018 05/14/2018 11/14/2017  PHQ - 2 Score 1 0 0 0 0 0 0  PHQ- 9 Score - 0 0 - 0 0 0    Fall Risk Fall Risk  04/21/2020 11/13/2019 02/14/2019 11/13/2018 05/14/2018  Falls in the past year? 0 1 1 1  0  Number falls in past yr: 0 0 1 0 0  Injury with Fall? 0 0 1 1 0  Risk for fall due to : History of fall(s) History of fall(s);Impaired balance/gait Orthopedic patient;Impaired balance/gait;History of fall(s) - -  Follow up Falls prevention discussed - Falls prevention discussed - Falls evaluation completed    FALL RISK PREVENTION PERTAINING TO THE HOME:  Any stairs in or around the home? No  If so, are there any without handrails? No  Home free of loose throw rugs in walkways, pet beds, electrical cords, etc? Yes  Adequate lighting in your home to reduce risk of falls? Yes   ASSISTIVE DEVICES UTILIZED TO PREVENT FALLS:  Life alert? No  Use of a cane, walker or w/c? No  Grab bars in the bathroom? No  Shower chair or bench in shower? Yes  Elevated toilet seat or a handicapped toilet? No   TIMED UP AND GO:  Was the test performed? No .  Telephonic visit.   Cognitive Function:     6CIT Screen 04/21/2020 02/14/2019  What Year? 0 points 0 points  What month? 0 points 0 points  What time? 0 points 0 points  Count back from 20 0 points 0 points  Months in reverse 0 points 0 points  Repeat phrase 0 points 4 points  Total Score 0 4    Immunizations Immunization History  Administered Date(s) Administered  . PFIZER(Purple Top)SARS-COV-2 Vaccination 03/20/2019, 04/07/2019  . Pneumococcal Conjugate-13 10/27/2014  . Pneumococcal Polysaccharide-23 11/22/2010    TDAP status: Due, Education has been provided regarding the importance of this vaccine. Advised may receive this vaccine at local pharmacy or Health Dept. Aware to provide a copy of the vaccination record if obtained from local pharmacy or Health Dept. Verbalized acceptance and understanding.  Flu Vaccine status: Declined, Education has been provided regarding the importance of this vaccine but patient still declined. Advised may receive this vaccine at local pharmacy or Health Dept. Aware to provide a copy of the vaccination record if obtained from local pharmacy or Health Dept. Verbalized acceptance and understanding.  Pneumococcal vaccine status: Up to date  Covid-19 vaccine status: Completed vaccines  Qualifies for Shingles Vaccine? Yes   Zostavax completed No   Shingrix Completed?: No.    Education has been provided regarding the importance of this vaccine. Patient has been advised to call insurance company to determine out of pocket expense if they have not yet received this vaccine. Advised may also receive vaccine at local pharmacy or Health Dept. Verbalized acceptance and understanding.  Screening Tests Health Maintenance  Topic Date Due  . COVID-19 Vaccine (3 - Booster for Pfizer series) 10/08/2019  . TETANUS/TDAP  01/17/2021 (Originally 04/18/1959)  . INFLUENZA VACCINE  08/17/2020  . PNA vac Low Risk Adult  Completed  . HPV VACCINES  Aged Out  . DEXA SCAN   Discontinued    Health Maintenance  Health Maintenance Due  Topic Date Due  . COVID-19 Vaccine (3 - Booster for Pfizer series) 10/08/2019    Colorectal cancer screening: No longer required.   Mammogram status: No  longer required due to age.  Bone density screening: no longer required due to age.   Lung Cancer Screening: (Low Dose CT Chest recommended if Age 22-80 years, 30 pack-year currently smoking OR have quit w/in 15years.) does not qualify.   Additional Screening:  Hepatitis C Screening: does not qualify.  Vision Screening: Recommended annual ophthalmology exams for early detection of glaucoma and other disorders of the eye. Is the patient up to date with their annual eye exam?  Yes  Who is the provider or what is the name of the office in which the patient attends annual eye exams? [facility]   Dental Screening: Recommended annual dental exams for proper oral hygiene  Community Resource Referral / Chronic Care Management: CRR required this visit?  No   CCM required this visit?  No      Plan:     I have personally reviewed and noted the following in the patient's chart:   . Medical and social history . Use of alcohol, tobacco or illicit drugs  . Current medications and supplements . Functional ability and status . Nutritional status . Physical activity . Advanced directives . List of other physicians . Hospitalizations, surgeries, and ER visits in previous 12 months . Vitals . Screenings to include cognitive, depression, and falls . Referrals and appointments  In addition, I have reviewed and discussed with patient certain preventive protocols, quality metrics, and best practice recommendations. A written personalized care plan for preventive services as well as general preventive health recommendations were provided to patient.     Sandra Littler, LPN   [id number]/[id number]   Nurse Notes: none

## 2020-04-21 NOTE — Patient Instructions (Signed)
[name] , Thank you for taking time to come for your Medicare Wellness Visit. I appreciate your ongoing commitment to your health goals. Please review the following plan we discussed and let me know if I can assist you in the future.   Screening recommendations/referrals: Colonoscopy: no longer required Mammogram: no longer required Bone Density: no longer required  Recommended yearly ophthalmology/optometry visit for glaucoma screening and checkup Recommended yearly dental visit for hygiene and checkup  Vaccinations: Influenza vaccine: declined Pneumococcal vaccine: done 10/26/13 Tdap vaccine: due Shingles vaccine: Shingrix discussed. Please contact your pharmacy for coverage information.  Covid-19: done 03/20/19 & 04/07/19  Advanced directives: Advance directive discussed with you today. Even though you declined this today please call our office should you change your mind and we can give you the proper paperwork for you to fill out.  Conditions/risks identified: Recommend increasing physical activity   Next appointment: Follow up in one year for your annual wellness visit    Preventive Care 65 Years and Older, Female Preventive care refers to lifestyle choices and visits with your health care provider that can promote health and wellness. What does preventive care include?  A yearly physical exam. This is also called an annual well check.  Dental exams once or twice a year.  Routine eye exams. Ask your health care provider how often you should have your eyes checked.  Personal lifestyle choices, including:  Daily care of your teeth and gums.  Regular physical activity.  Eating a healthy diet.  Avoiding tobacco and drug use.  Limiting alcohol use.  Practicing safe sex.  Taking low-dose aspirin every day.  Taking vitamin and mineral supplements as recommended by your health care provider. What happens during an annual well check? The services and screenings done  by your health care provider during your annual well check will depend on your age, overall health, lifestyle risk factors, and family history of disease. Counseling  Your health care provider may ask you questions about your:  Alcohol use.  Tobacco use.  Drug use.  Emotional well-being.  Home and relationship well-being.  Sexual activity.  Eating habits.  History of falls.  Memory and ability to understand (cognition).  Work and work Astronomer.  Reproductive health. Screening  You may have the following tests or measurements:  Height, weight, and BMI.  Blood pressure.  Lipid and cholesterol levels. These may be checked every 5 years, or more frequently if you are over 4 years old.  Skin check.  Lung cancer screening. You may have this screening every year starting at age 33 if you have a 30-pack-year history of smoking and currently smoke or have quit within the past 15 years.  Fecal occult blood test (FOBT) of the stool. You may have this test every year starting at age 44.  Flexible sigmoidoscopy or colonoscopy. You may have a sigmoidoscopy every 5 years or a colonoscopy every 10 years starting at age 89.  Hepatitis C blood test.  Hepatitis B blood test.  Sexually transmitted disease (STD) testing.  Diabetes screening. This is done by checking your blood sugar (glucose) after you have not eaten for a while (fasting). You may have this done every 1-3 years.  Bone density scan. This is done to screen for osteoporosis. You may have this done starting at age 23.  Mammogram. This may be done every 1-2 years. Talk to your health care provider about how often you should have regular mammograms. Talk with your health care provider about your  test results, treatment options, and if necessary, the need for more tests. Vaccines  Your health care provider may recommend certain vaccines, such as:  Influenza vaccine. This is recommended every year.  Tetanus,  diphtheria, and acellular pertussis (Tdap, Td) vaccine. You may need a Td booster every 10 years.  Zoster vaccine. You may need this after age 63.  Pneumococcal 13-valent conjugate (PCV13) vaccine. One dose is recommended after age 66.  Pneumococcal polysaccharide (PPSV23) vaccine. One dose is recommended after age 18. Talk to your health care provider about which screenings and vaccines you need and how often you need them. This information is not intended to replace advice given to you by your health care provider. Make sure you discuss any questions you have with your health care provider. Document Released: 01/30/2015 Document Revised: 09/23/2015 Document Reviewed: 11/04/2014 Elsevier Interactive Patient Education  2017 [place] Prevention in the Home Falls can cause injuries. They can happen to people of all ages. There are many things you can do to make your home safe and to help prevent falls. What can I do on the outside of my home?  Regularly fix the edges of walkways and driveways and fix any cracks.  Remove anything that might make you trip as you walk through a door, such as a raised step or threshold.  Trim any bushes or trees on the path to your home.  Use bright outdoor lighting.  Clear any walking paths of anything that might make someone trip, such as rocks or tools.  Regularly check to see if handrails are loose or broken. Make sure that both sides of any steps have handrails.  Any raised decks and porches should have guardrails on the edges.  Have any leaves, snow, or ice cleared regularly.  Use sand or salt on walking paths during winter.  Clean up any spills in your garage right away. This includes oil or grease spills. What can I do in the bathroom?  Use night lights.  Install grab bars by the toilet and in the tub and shower. Do not use towel bars as grab bars.  Use non-skid mats or decals in the tub or shower.  If you need to sit down in  the shower, use a plastic, non-slip stool.  Keep the floor dry. Clean up any water that spills on the floor as soon as it happens.  Remove soap buildup in the tub or shower regularly.  Attach bath mats securely with double-sided non-slip rug tape.  Do not have throw rugs and other things on the floor that can make you trip. What can I do in the bedroom?  Use night lights.  Make sure that you have a light by your bed that is easy to reach.  Do not use any sheets or blankets that are too big for your bed. They should not hang down onto the floor.  Have a firm chair that has side arms. You can use this for support while you get dressed.  Do not have throw rugs and other things on the floor that can make you trip. What can I do in the kitchen?  Clean up any spills right away.  Avoid walking on wet floors.  Keep items that you use a lot in easy-to-reach places.  If you need to reach something above you, use a strong step stool that has a grab bar.  Keep electrical cords out of the way.  Do not use floor polish or wax that  makes floors slippery. If you must use wax, use non-skid floor wax.  Do not have throw rugs and other things on the floor that can make you trip. What can I do with my stairs?  Do not leave any items on the stairs.  Make sure that there are handrails on both sides of the stairs and use them. Fix handrails that are broken or loose. Make sure that handrails are as long as the stairways.  Check any carpeting to make sure that it is firmly attached to the stairs. Fix any carpet that is loose or worn.  Avoid having throw rugs at the top or bottom of the stairs. If you do have throw rugs, attach them to the floor with carpet tape.  Make sure that you have a light switch at the top of the stairs and the bottom of the stairs. If you do not have them, ask someone to add them for you. What else can I do to help prevent falls?  Wear shoes that:  Do not have high  heels.  Have rubber bottoms.  Are comfortable and fit you well.  Are closed at the toe. Do not wear sandals.  If you use a stepladder:  Make sure that it is fully opened. Do not climb a closed stepladder.  Make sure that both sides of the stepladder are locked into place.  Ask someone to hold it for you, if possible.  Clearly mark and make sure that you can see:  Any grab bars or handrails.  First and last steps.  Where the edge of each step is.  Use tools that help you move around (mobility aids) if they are needed. These include:  Canes.  Walkers.  Scooters.  Crutches.  Turn on the lights when you go into a dark area. Replace any light bulbs as soon as they burn out.  Set up your furniture so you have a clear path. Avoid moving your furniture around.  If any of your floors are uneven, fix them.  If there are any pets around you, be aware of where they are.  Review your medicines with your doctor. Some medicines can make you feel dizzy. This can increase your chance of falling. Ask your doctor what other things that you can do to help prevent falls. This information is not intended to replace advice given to you by your health care provider. Make sure you discuss any questions you have with your health care provider. Document Released: 10/30/2008 Document Revised: 06/11/2015 Document Reviewed: 02/07/2014 Elsevier Interactive Patient Education  2017 [employer].

## 2020-04-23 ENCOUNTER — Other Ambulatory Visit: Payer: Self-pay

## 2020-04-23 ENCOUNTER — Telehealth: Payer: Self-pay

## 2020-04-23 DIAGNOSIS — I10 Essential (primary) hypertension: Secondary | ICD-10-CM

## 2020-04-23 MED ORDER — VITAMIN D3 50 MCG (2000 UT) PO CAPS: 2000.0000 [IU] | ORAL_CAPSULE | Freq: Every day | ORAL | 3 refills | Status: AC

## 2020-04-23 MED ORDER — LISINOPRIL 2.5 MG PO TABS: 2.5000 mg | ORAL_TABLET | Freq: Every day | ORAL | 0 refills | Status: DC

## 2020-04-23 MED ORDER — ONE-DAILY MULTI VITAMINS PO TABS: 1.0000 | ORAL_TABLET | Freq: Every day | ORAL | 3 refills | Status: AC

## 2020-04-23 NOTE — Telephone Encounter (Signed)
Copied from CRM [id number]. Topic: Quick Communication - Rx Refill/Question >> Apr 23, 2020 10:16 AM Aretta Nip wrote: Medication: lisinopril (ZESTRIL) 2.5 MG tablet 90 tablet 1 11/13/2019   Sig - Route: Take 1 tablet (2.5 mg total) by mouth daily. - Oral  Sent to pharmacy as: lisinopril (ZESTRIL) 2.5 MG tablet  Pt states still has meds but will be out before appt and wants a cb as wanting to have refill before her appt as Dr Kathie Rhodes will be on vacation and she, pt maybe out of town / Pt told that would be good to call in closer to the time she is running out of med but insist on asking dr Kathie Rhodes....also wanted a CB re this  [phone]   Agent: Please be advised that RX refills may take up to 3 business days. We ask that you follow-up with your pharmacy.

## 2020-04-23 NOTE — Telephone Encounter (Signed)
Spoke with patient, reassured her we would not let her go without. Sent to Dr.Sowles to add refills for patient. She was pleasant and expressed no further questions or concerns at this time.

## 2020-04-30 ENCOUNTER — Telehealth: Payer: Self-pay | Admitting: Family Medicine

## 2020-04-30 ENCOUNTER — Other Ambulatory Visit: Payer: Self-pay | Admitting: Family Medicine

## 2020-04-30 DIAGNOSIS — I10 Essential (primary) hypertension: Secondary | ICD-10-CM

## 2020-04-30 NOTE — Telephone Encounter (Signed)
Pt wants to know why Dr Carlynn Purl called in  Cholecalciferol (VITAMIN D3) 50 MCG (2000 UT) capsule  To her pharmacy on 04/23/2020. Pt states no one told her about that.

## 2020-05-12 ENCOUNTER — Ambulatory Visit: Payer: Medicare PPO | Admitting: Family Medicine

## 2020-06-17 ENCOUNTER — Encounter: Payer: Self-pay | Admitting: Family Medicine

## 2020-06-17 ENCOUNTER — Ambulatory Visit: Payer: Medicare PPO | Admitting: Family Medicine

## 2020-06-17 ENCOUNTER — Other Ambulatory Visit: Payer: Self-pay

## 2020-06-17 VITALS — BP 136/70 | HR 82 | Temp 98.1°F | Resp 16 | Ht 61.0 in | Wt 137.0 lb

## 2020-06-17 DIAGNOSIS — D692 Other nonthrombocytopenic purpura: Secondary | ICD-10-CM | POA: Diagnosis not present

## 2020-06-17 DIAGNOSIS — M810 Age-related osteoporosis without current pathological fracture: Secondary | ICD-10-CM

## 2020-06-17 DIAGNOSIS — I10 Essential (primary) hypertension: Secondary | ICD-10-CM

## 2020-06-17 DIAGNOSIS — E871 Hypo-osmolality and hyponatremia: Secondary | ICD-10-CM | POA: Diagnosis not present

## 2020-06-17 DIAGNOSIS — Z8781 Personal history of (healed) traumatic fracture: Secondary | ICD-10-CM

## 2020-06-17 DIAGNOSIS — E785 Hyperlipidemia, unspecified: Secondary | ICD-10-CM | POA: Diagnosis not present

## 2020-06-17 MED ORDER — LISINOPRIL 2.5 MG PO TABS: 2.5000 mg | ORAL_TABLET | Freq: Every day | ORAL | 3 refills | Status: DC

## 2020-06-17 NOTE — Progress Notes (Signed)
Name: Sandra [name]   MRN: [id number]    DOB: [birth year]   Date:06/17/2020       Progress Note  Subjective  Chief Complaint  Follow Up  HPI  History of fracture of right hip: it happened in 08/2018 she has been doing well now, she is no longer using a cane. She occasionally has an aching pain when is going to rain.   Senile purpura:she states still bruises on arms intermittent, she stopped taking aspirin but still happens, gave her reassurance   HTN: she is down to 2.5 mg of lisinopril daily, bp at home has been well controlled - not sure of the values / sister checks it for her. No chest pain, palpitation or SOB. She only has some dizziness if she jumps out of bed, it happens very seldom . Advised to get up slowly   Dyslipidemia: not interested in medications, discussed healthy diet. We will not recheck labs since not interested on therapy    Hyponatremia : last level was stable at 133.This has been ongoing for a few  years and has remained stable.  She states that she drinks 32 ounces of water daily, otherwise drinks Gatorade or cranberry juice . No headaches , no muscle tremor or irritability.   Osteoporosis: she never started Alendronate, only taking vitamin D otc , discussed high calcium diet , she refused any other form of therapy   Patient Active Problem List   Diagnosis Date Noted  . History of fracture of right hip 05/14/2019  . Iron deficiency anemia 05/14/2019  . Dyslipidemia 05/14/2019  . Closed displaced intertrochanteric fracture of right femur with routine healing 10/12/2018  . Age-related osteoporosis without current pathological fracture 08/27/2018  . Hyponatremia 08/27/2018  . Hip fracture (HCC) 08/19/2018  . Hiatal hernia 10/27/2014  . Hypertension, benign 10/27/2014  . Female pattern hair loss 10/27/2014  . Nocturia 10/27/2014  . Left-sided Bell's palsy 10/27/2014  . Generalized anxiety disorder 10/27/2014  . Senile purpura (HCC) 10/27/2014     Past Surgical History:  Procedure Laterality Date  . ABDOMINAL HYSTERECTOMY    . HIP SURGERY  01/22/2019  . INTRAMEDULLARY (IM) NAIL INTERTROCHANTERIC Right 08/19/2018   Procedure: INTRAMEDULLARY (IM) NAIL INTERTROCHANTRIC;  Surgeon: Karleen Hampshire, MD;  Location: [facility] ORS;  Service: Orthopedics;  Laterality: Right;  . MULTIPLE TOOTH EXTRACTIONS N/A    patient had all of her bottom teeth pulled about 2-3 weeks ago.  . TONSILLECTOMY      Family History  Problem Relation Age of Onset  . Hypertension Daughter   . Hypertension Sister   . Hypertension Brother     Social History   Tobacco Use  . Smoking status: Never Smoker  . Smokeless tobacco: Never Used  Substance Use Topics  . Alcohol use: No    Alcohol/week: 0.0 standard drinks     Current Outpatient Medications:  .  Cholecalciferol (VITAMIN D3) 50 MCG (2000 UT) capsule, Take 1 capsule (2,000 Units total) by mouth daily., Disp: 30 capsule, Rfl: 3 .  lisinopril (ZESTRIL) 2.5 MG tablet, Take 1 tablet (2.5 mg total) by mouth daily., Disp: 90 tablet, Rfl: 0 .  Multiple Vitamin (MULTIVITAMIN) tablet, Take 1 tablet by mouth daily., Disp: 30 tablet, Rfl: 3  No Known Allergies  I personally reviewed active problem list, medication list, allergies, family history, social history, health maintenance with the patient/caregiver today.   ROS  Constitutional: Negative for fever or weight change.  Respiratory: Negative for cough and shortness of breath.  Cardiovascular: Negative for chest pain or palpitations.  Gastrointestinal: Negative for abdominal pain, no bowel changes.  Musculoskeletal: Negative for gait problem or joint swelling.  Skin: Negative for rash.  Neurological: Negative for dizziness or headache.  No other specific complaints in a complete review of systems (except as listed in HPI above).  Objective  Vitals:   06/17/20 1500  BP: 136/70  Pulse: 82  Resp: 16  Temp: 98.1 F (36.7 C)  TempSrc: Oral  SpO2:  99%  Weight: 137 lb (62.1 kg)  Height: 5\' 1"  (1.549 m)    Body mass index is 25.89 kg/m.  Physical Exam  Constitutional: Patient appears well-developed and well-nourished.  No distress.  HEENT: head atraumatic, normocephalic, pupils equal and reactive to light,  neck supple Cardiovascular: Normal rate, regular rhythm and normal heart sounds.  1/6 systolic ejection  murmur heard. No BLE edema. Pulmonary/Chest: Effort normal and breath sounds normal. No respiratory distress. Abdominal: Soft.  There is no tenderness. Psychiatric: Patient has a normal mood and affect. behavior is normal. Judgment and thought content normal.  PHQ2/9: Depression screen [facility] 2/9 06/17/2020 04/21/2020 11/13/2019 05/14/2019 02/14/2019  Decreased Interest 0 0 0 0 0  Down, Depressed, Hopeless 0 1 0 0 0  PHQ - 2 Score 0 1 0 0 0  Altered sleeping - - 0 0 -  Tired, decreased energy - - 0 0 -  Change in appetite - - 0 0 -  Feeling bad or failure about yourself  - - 0 0 -  Trouble concentrating - - 0 0 -  Moving slowly or fidgety/restless - - 0 0 -  Suicidal thoughts - - 0 0 -  PHQ-9 Score - - 0 0 -  Difficult doing work/chores - - - - -    phq 9 is negative   Fall Risk: Fall Risk  06/17/2020 04/21/2020 11/13/2019 02/14/2019 11/13/2018  Falls in the past year? 0 0 1 1 1   Number falls in past yr: 0 0 0 1 0  Injury with Fall? 0 0 0 1 1  Risk for fall due to : - History of fall(s) History of fall(s);Impaired balance/gait Orthopedic patient;Impaired balance/gait;History of fall(s) -  Follow up - Falls prevention discussed - Falls prevention discussed -     Functional Status Survey: Is the patient deaf or have difficulty hearing?: No Does the patient have difficulty seeing, even when wearing glasses/contacts?: No Does the patient have difficulty concentrating, remembering, or making decisions?: No Does the patient have difficulty walking or climbing stairs?: No Does the patient have difficulty dressing or bathing?:  No Does the patient have difficulty doing errands alone such as visiting a doctor's office or shopping?: No    Assessment & Plan  1. Senile purpura (HCC)  Reassurance given   2. History of hip fracture  Doing well   3. Age-related osteoporosis without current pathological fracture  Refuses therapy   4. Dyslipidemia  Not interested in statin therapy   5. Hypertension, benign  - COMPLETE METABOLIC PANEL WITH GFR - CBC with Differential/Platelet - lisinopril (ZESTRIL) 2.5 MG tablet; Take 1 tablet (2.5 mg total) by mouth daily.  Dispense: 90 tablet; Refill: 3  6. Hyponatremia  Recheck labs today

## 2020-06-18 LAB — CBC WITH DIFFERENTIAL/PLATELET
Absolute Monocytes: 746 cells/uL (ref 200–950)
Basophils Absolute: 28 cells/uL (ref 0–200)
Basophils Relative: 0.4 %
Eosinophils Absolute: 28 cells/uL (ref 15–500)
Eosinophils Relative: 0.4 %
HCT: 39.1 % (ref 35.0–45.0)
Hemoglobin: 12.8 g/dL (ref 11.7–15.5)
Lymphs Abs: 1974 cells/uL (ref 850–3900)
MCH: 30.5 pg (ref 27.0–33.0)
MCHC: 32.7 g/dL (ref 32.0–36.0)
MCV: 93.3 fL (ref 80.0–100.0)
MPV: 9.7 fL (ref 7.5–12.5)
Monocytes Relative: 10.5 %
Neutro Abs: 4324 cells/uL (ref 1500–7800)
Neutrophils Relative %: 60.9 %
Platelets: 242 10*3/uL (ref 140–400)
RBC: 4.19 10*6/uL (ref 3.80–5.10)
RDW: 11.9 % (ref 11.0–15.0)
Total Lymphocyte: 27.8 %
WBC: 7.1 10*3/uL (ref 3.8–10.8)

## 2020-06-18 LAB — COMPLETE METABOLIC PANEL WITH GFR
AG Ratio: 1.7 (calc) (ref 1.0–2.5)
ALT: 12 U/L (ref 6–29)
AST: 17 U/L (ref 10–35)
Albumin: 4.5 g/dL (ref 3.6–5.1)
Alkaline phosphatase (APISO): 70 U/L (ref 37–153)
BUN: 11 mg/dL (ref 7–25)
CO2: 25 mmol/L (ref 20–32)
Calcium: 9.4 mg/dL (ref 8.6–10.4)
Chloride: 97 mmol/L — ABNORMAL LOW (ref 98–110)
Creat: 0.77 mg/dL (ref 0.60–0.88)
GFR, Est African American: 83 mL/min/{1.73_m2} (ref >=60)
GFR, Est Non African American: 72 mL/min/{1.73_m2} (ref >=60)
Globulin: 2.6 g/dL (calc) (ref 1.9–3.7)
Glucose, Bld: 91 mg/dL (ref 65–99)
Potassium: 4.6 mmol/L (ref 3.5–5.3)
Sodium: 130 mmol/L — ABNORMAL LOW (ref 135–146)
Total Bilirubin: 0.5 mg/dL (ref 0.2–1.2)
Total Protein: 7.1 g/dL (ref 6.1–8.1)

## 2020-08-18 IMAGING — RF OPERATIVE RIGHT HIP WITH PELVIS
1 series · 5 of 5 positions shown · non-contrast
Comparison: None.

CLINICAL DATA: Right hip ORIF

EXAM:
OPERATIVE RIGHT HIP (WITH PELVIS IF PERFORMED) 5 VIEWS
TECHNIQUE: Fluoroscopic spot image(s) were submitted for interpretation
post-operatively.

[Series 1: run · 5 of 5 slices shown]
[im 1/5]
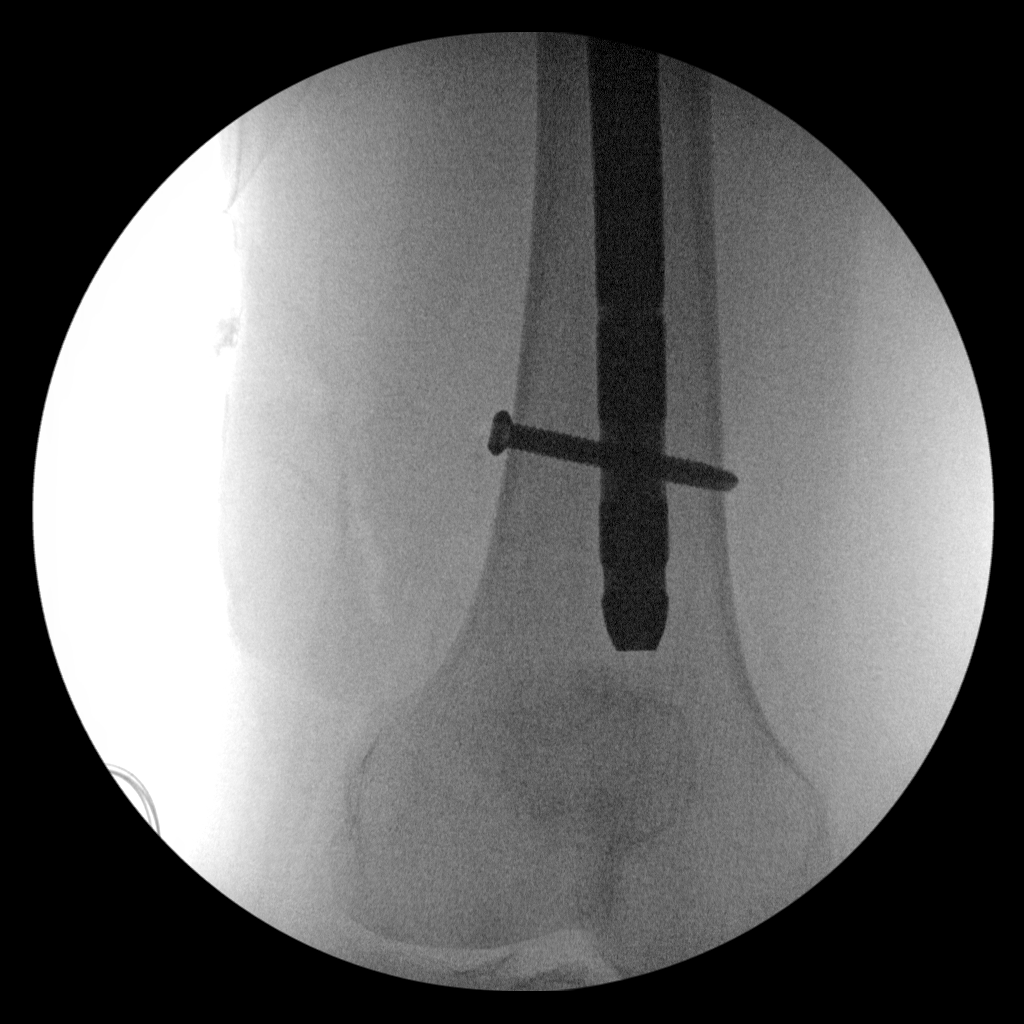
[im 2/5]
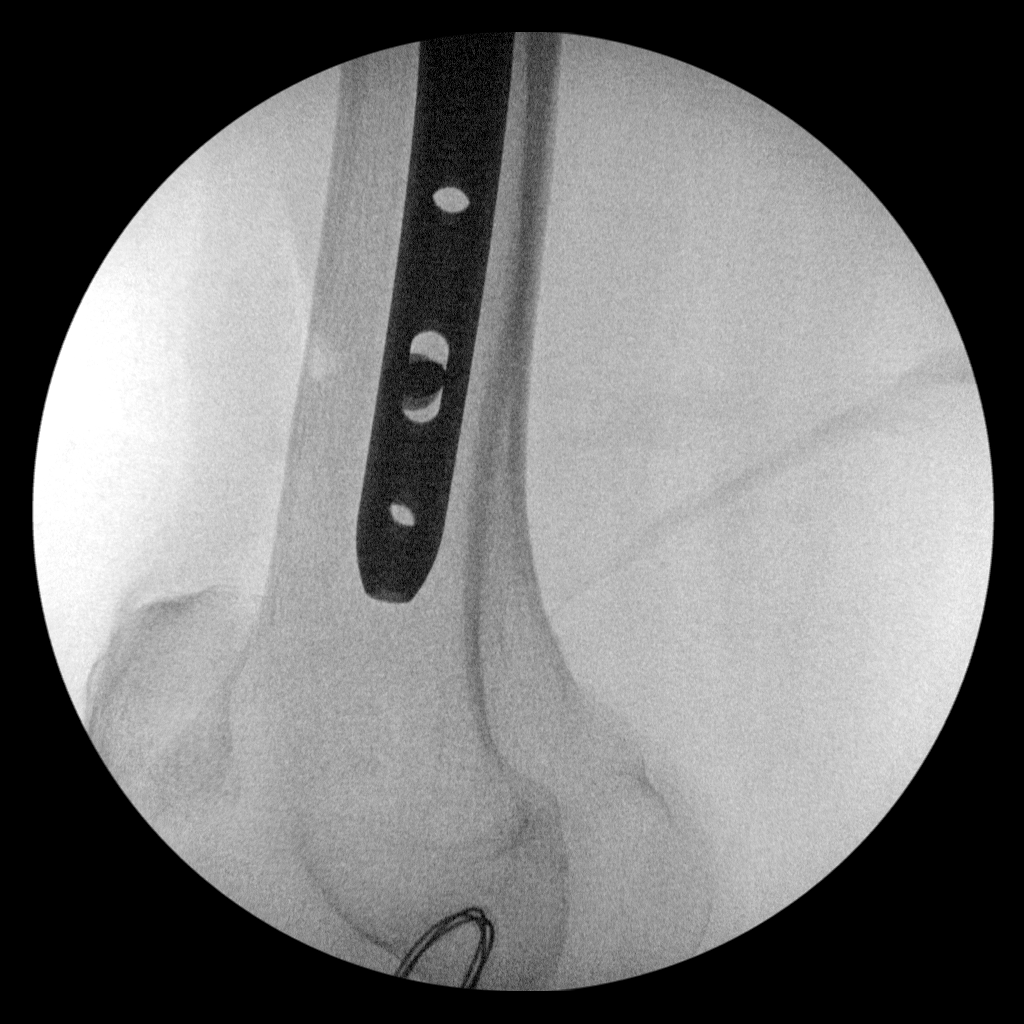
[im 3/5]
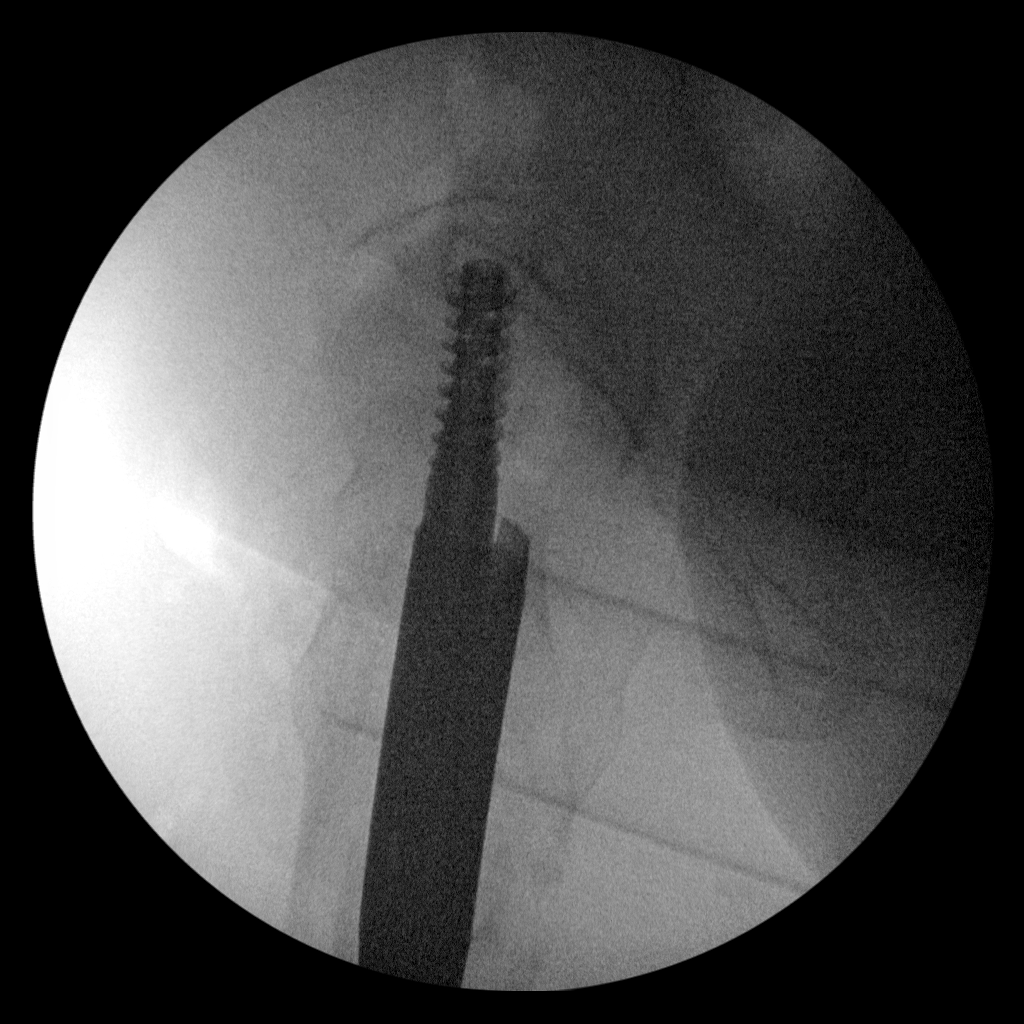
[im 4/5]
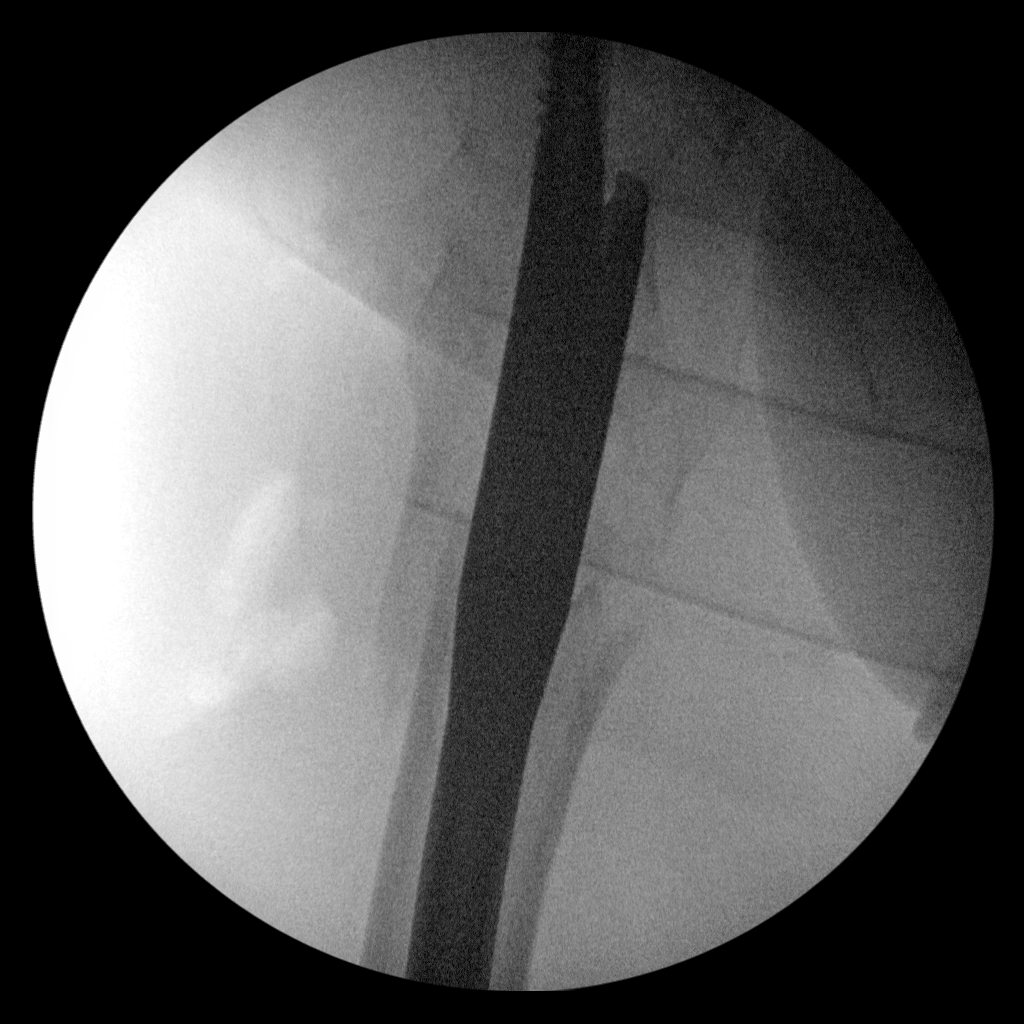
[im 5/5]
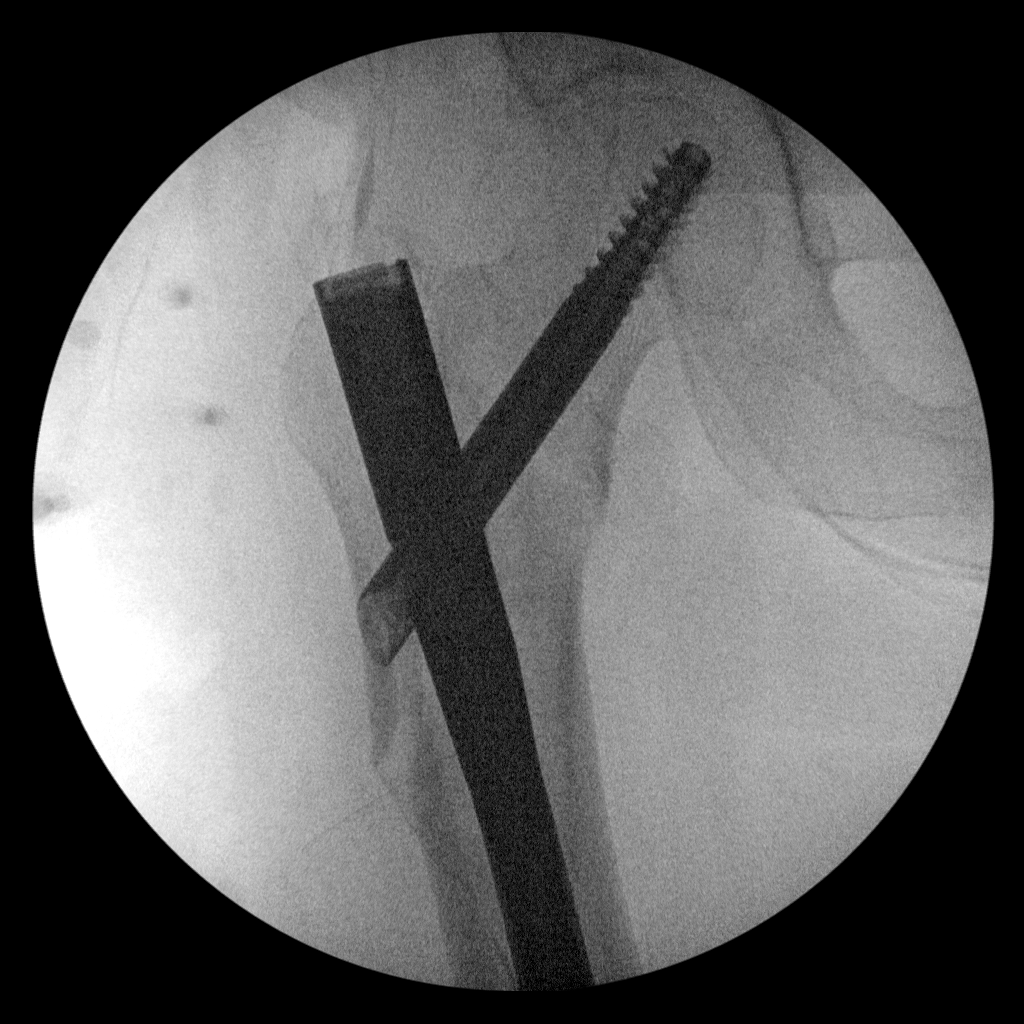

[5 of 5 positions shown; findings below may reference images not displayed]

FINDINGS: Five fluoroscopic images were obtained during open reduction
internal fixation of a right femoral fracture. Fluoroscopy time was
reported as 1 minutes 10 seconds.
IMPRESSION: Intraoperative fluoroscopy.

## 2020-08-18 IMAGING — DX RIGHT FEMUR PORTABLE 2 VIEW
3 series · 3 of 3 positions shown · non-contrast
Comparison: None.

CLINICAL DATA: Status post ORIF proximal right femur fracture.

EXAM:
RIGHT FEMUR PORTABLE 2 VIEW

[femur ap (1 of 2)]
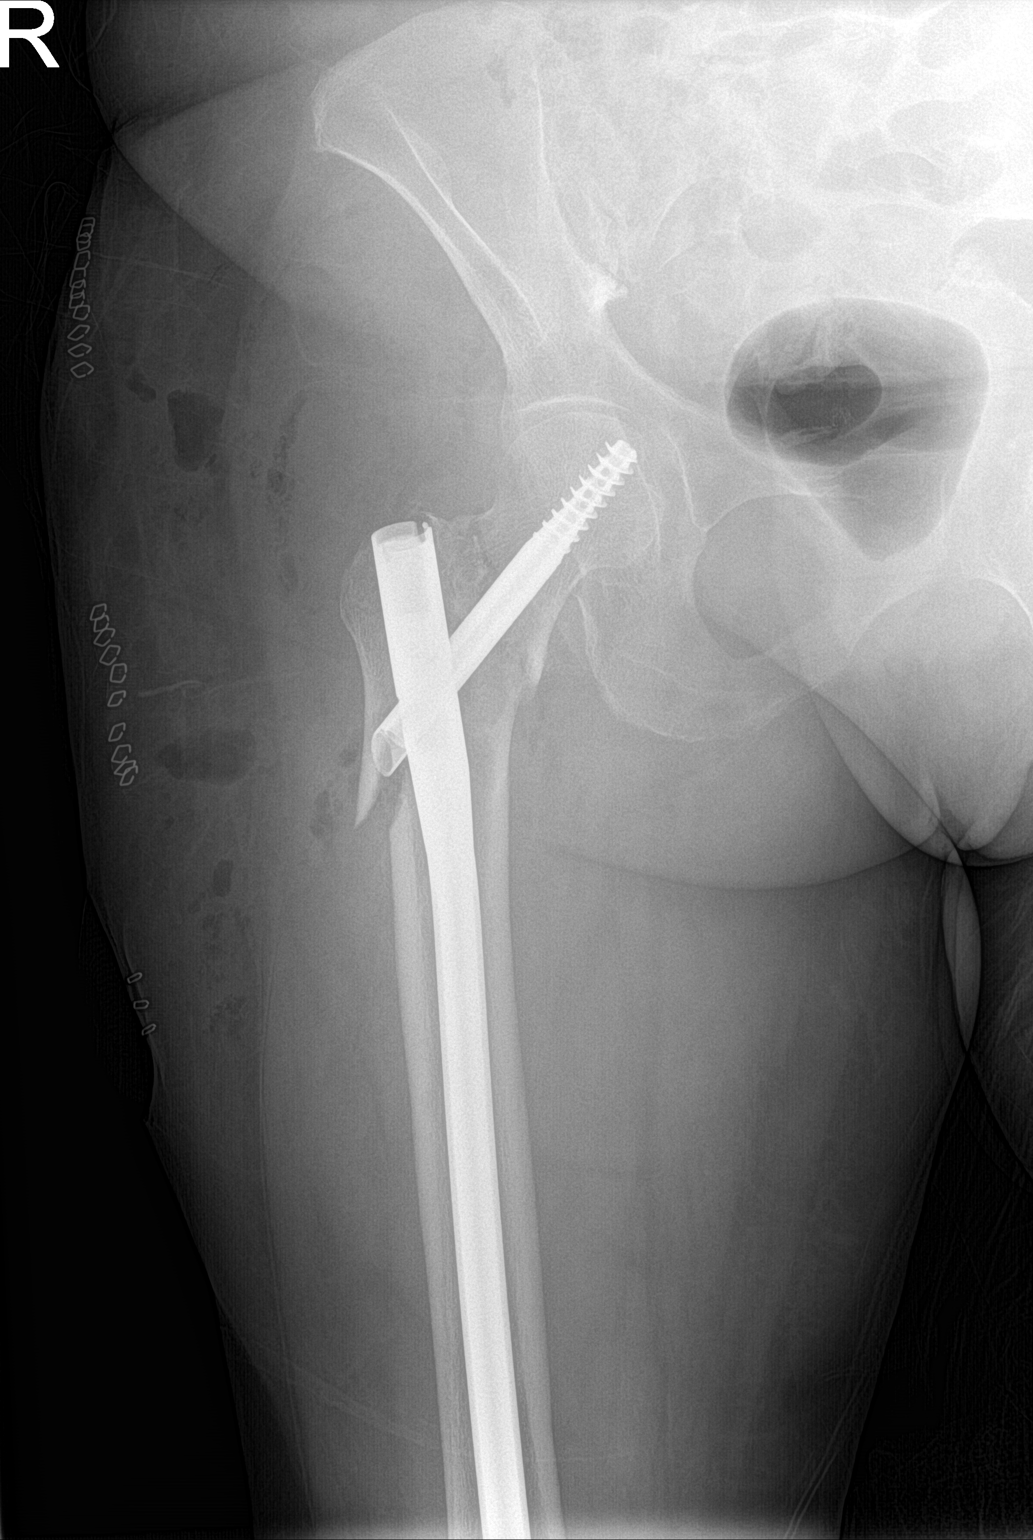

[femur ap (2 of 2)]
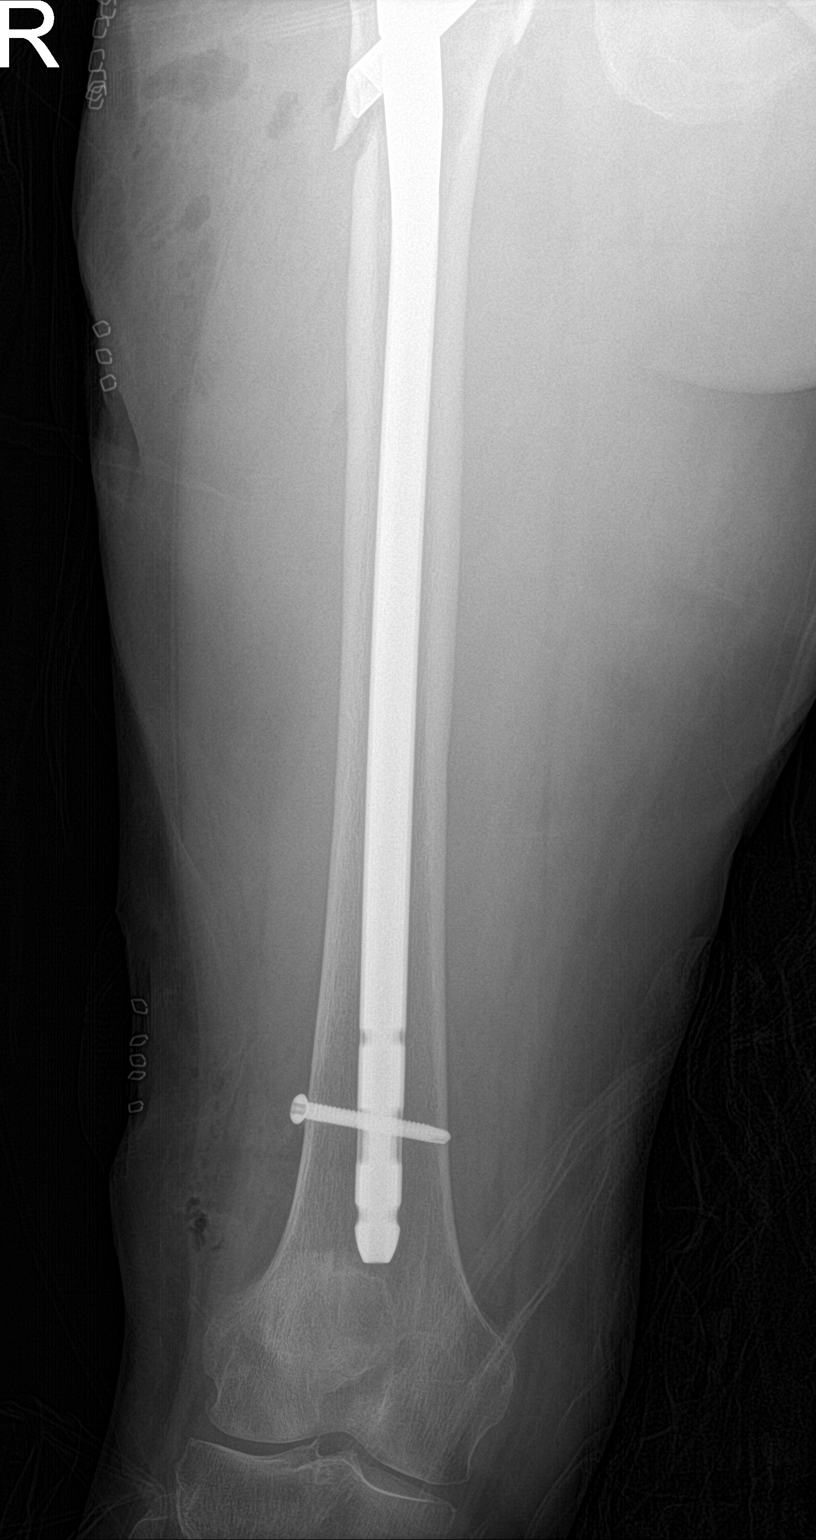

[femur lat]
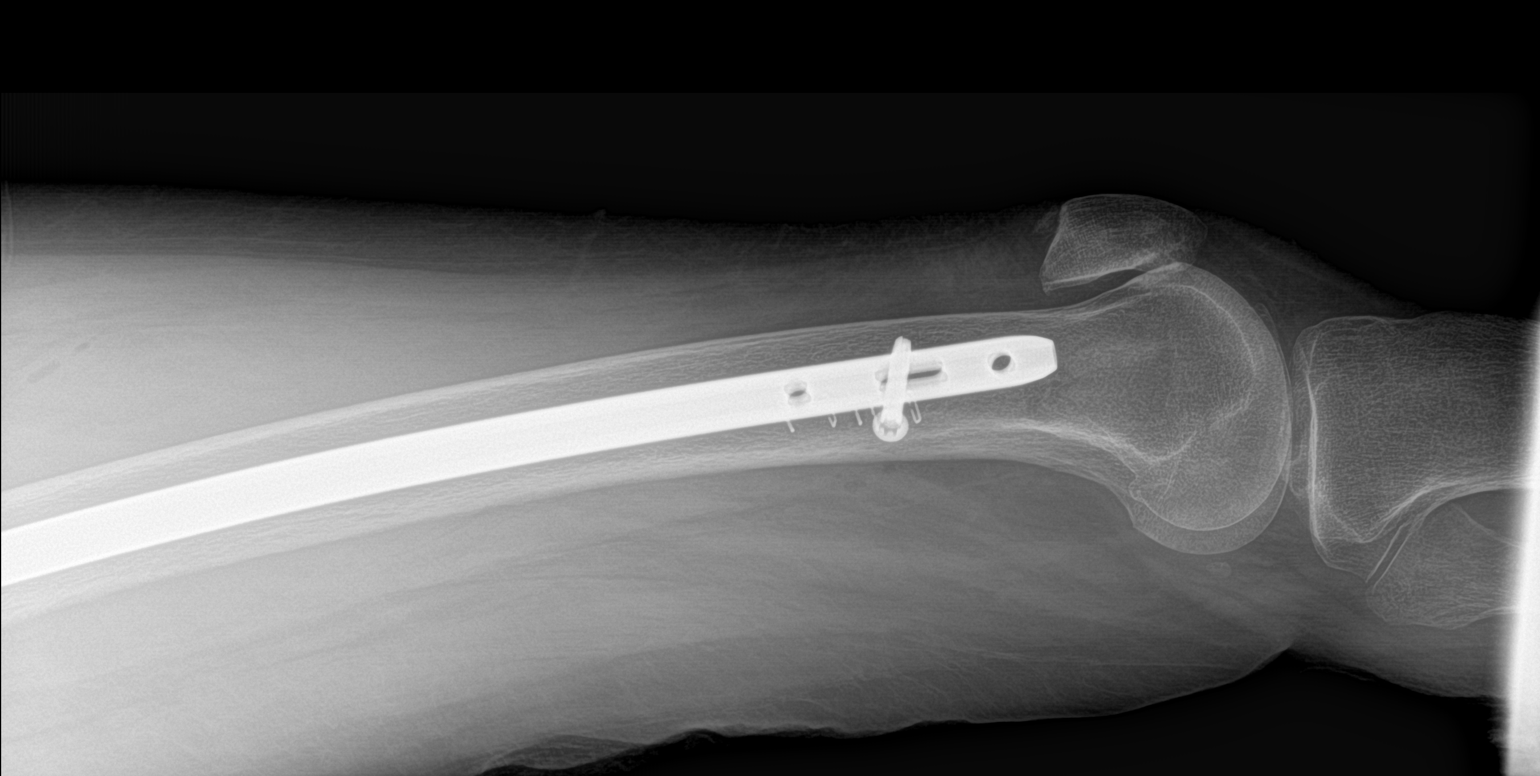

[3 of 3 positions shown; findings below may reference images not displayed]

FINDINGS: IM nail and hip screw are identified status post open reduction and
internal fixation of comminuted fracture deformity involving the
proximal right femur. The hardware components and fracture fragments
are in near anatomic alignment. No complicating features identified.
Gas is identified within the soft tissues overlying the right hip.
IMPRESSION: 1. Status post ORIF of proximal right femur fracture.

## 2020-08-18 IMAGING — CR PORTABLE CHEST - 1 VIEW
1 series · 1 of 1 positions shown · non-contrast
Comparison: Chest radiograph 08/01/2008

CLINICAL DATA: Status post fall.

EXAM:
PORTABLE CHEST 1 VIEW

[chest ap]
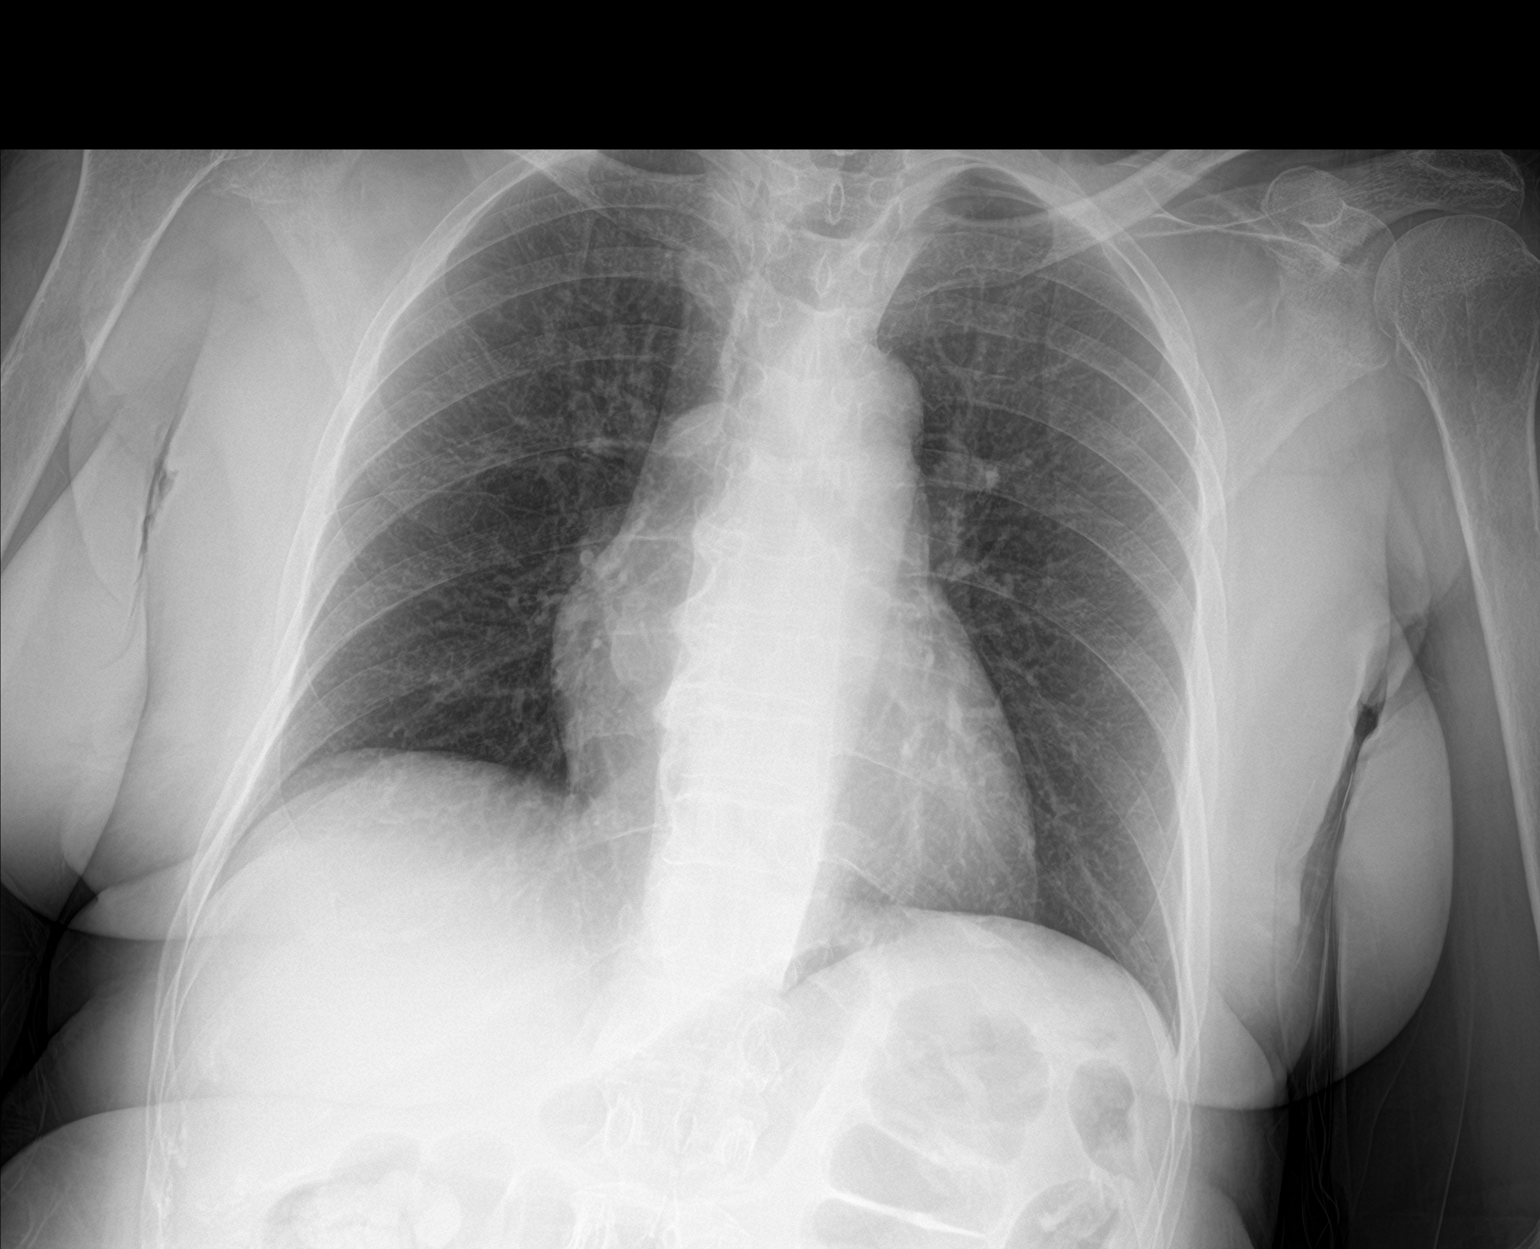

[1 of 1 positions shown; findings below may reference images not displayed]

FINDINGS: Normal cardiac and mediastinal contours. No consolidative pulmonary
opacities. No pleural effusion or pneumothorax. Thoracic spine
degenerative changes.
IMPRESSION: No acute cardiopulmonary process.

## 2020-12-22 ENCOUNTER — Encounter: Payer: Medicare PPO | Admitting: Family Medicine

## 2021-03-03 ENCOUNTER — Encounter: Payer: Medicare PPO | Admitting: Family Medicine

## 2021-04-13 ENCOUNTER — Encounter: Payer: Medicare PPO | Admitting: Family Medicine

## 2021-04-22 ENCOUNTER — Ambulatory Visit: Payer: Medicare PPO

## 2021-05-06 ENCOUNTER — Ambulatory Visit (INDEPENDENT_AMBULATORY_CARE_PROVIDER_SITE_OTHER): Payer: Medicare PPO

## 2021-05-06 DIAGNOSIS — Z0000 Encounter for general adult medical examination without abnormal findings: Secondary | ICD-10-CM

## 2021-05-06 NOTE — Patient Instructions (Signed)
Ms. Starnes , ?Thank you for taking time to come for your Medicare Wellness Visit. I appreciate your ongoing commitment to your health goals. Please review the following plan we discussed and let me know if I can assist you in the future.  ? ?Screening recommendations/referrals: ?Colonoscopy: no longer required ?Mammogram: no longer required ?Bone Density: no longer reuquired ?Recommended yearly ophthalmology/optometry visit for glaucoma screening and checkup ?Recommended yearly dental visit for hygiene and checkup ? ?Vaccinations: ?Influenza vaccine: declined ?Pneumococcal vaccine: done 10/27/14 ?Tdap vaccine: due ?Shingles vaccine: Shingrix discussed. Please contact your pharmacy for coverage information.  ?Covid-19: done 03/20/19 & 04/07/19 ? ?Advanced directives: Advance directive discussed with you today. Even though you declined this today please call our office should you change your mind and we can give you the proper paperwork for you to fill out.  ? ?Conditions/risks identified: Keep up the great work! ? ?Next appointment: Follow up in one year for your annual wellness visit  ? ? ?Preventive Care 40 Years and Older, Female ?Preventive care refers to lifestyle choices and visits with your health care provider that can promote health and wellness. ?What does preventive care include? ?A yearly physical exam. This is also called an annual well check. ?Dental exams once or twice a year. ?Routine eye exams. Ask your health care provider how often you should have your eyes checked. ?Personal lifestyle choices, including: ?Daily care of your teeth and gums. ?Regular physical activity. ?Eating a healthy diet. ?Avoiding tobacco and drug use. ?Limiting alcohol use. ?Practicing safe sex. ?Taking low-dose aspirin every day. ?Taking vitamin and mineral supplements as recommended by your health care provider. ?What happens during an annual well check? ?The services and screenings done by your health care provider during  your annual well check will depend on your age, overall health, lifestyle risk factors, and family history of disease. ?Counseling  ?Your health care provider may ask you questions about your: ?Alcohol use. ?Tobacco use. ?Drug use. ?Emotional well-being. ?Home and relationship well-being. ?Sexual activity. ?Eating habits. ?History of falls. ?Memory and ability to understand (cognition). ?Work and work Astronomer. ?Reproductive health. ?Screening  ?You may have the following tests or measurements: ?Height, weight, and BMI. ?Blood pressure. ?Lipid and cholesterol levels. These may be checked every 5 years, or more frequently if you are over 44 years old. ?Skin check. ?Lung cancer screening. You may have this screening every year starting at age 73 if you have a 30-pack-year history of smoking and currently smoke or have quit within the past 15 years. ?Fecal occult blood test (FOBT) of the stool. You may have this test every year starting at age 4. ?Flexible sigmoidoscopy or colonoscopy. You may have a sigmoidoscopy every 5 years or a colonoscopy every 10 years starting at age 24. ?Hepatitis C blood test. ?Hepatitis B blood test. ?Sexually transmitted disease (STD) testing. ?Diabetes screening. This is done by checking your blood sugar (glucose) after you have not eaten for a while (fasting). You may have this done every 1-3 years. ?Bone density scan. This is done to screen for osteoporosis. You may have this done starting at age 63. ?Mammogram. This may be done every 1-2 years. Talk to your health care provider about how often you should have regular mammograms. ?Talk with your health care provider about your test results, treatment options, and if necessary, the need for more tests. ?Vaccines  ?Your health care provider may recommend certain vaccines, such as: ?Influenza vaccine. This is recommended every year. ?Tetanus, diphtheria, and acellular pertussis (  Tdap, Td) vaccine. You may need a Td booster every 10  years. ?Zoster vaccine. You may need this after age 28. ?Pneumococcal 13-valent conjugate (PCV13) vaccine. One dose is recommended after age 8. ?Pneumococcal polysaccharide (PPSV23) vaccine. One dose is recommended after age 79. ?Talk to your health care provider about which screenings and vaccines you need and how often you need them. ?This information is not intended to replace advice given to you by your health care provider. Make sure you discuss any questions you have with your health care provider. ?Document Released: 01/30/2015 Document Revised: 09/23/2015 Document Reviewed: 11/04/2014 ?Elsevier Interactive Patient Education ? 2017 [place]. ? ?Fall Prevention in the Home ?Falls can cause injuries. They can happen to people of all ages. There are many things you can do to make your home safe and to help prevent falls. ?What can I do on the outside of my home? ?Regularly fix the edges of walkways and driveways and fix any cracks. ?Remove anything that might make you trip as you walk through a door, such as a raised step or threshold. ?Trim any bushes or trees on the path to your home. ?Use bright outdoor lighting. ?Clear any walking paths of anything that might make someone trip, such as rocks or tools. ?Regularly check to see if handrails are loose or broken. Make sure that both sides of any steps have handrails. ?Any raised decks and porches should have guardrails on the edges. ?Have any leaves, snow, or ice cleared regularly. ?Use sand or salt on walking paths during winter. ?Clean up any spills in your garage right away. This includes oil or grease spills. ?What can I do in the bathroom? ?Use night lights. ?Install grab bars by the toilet and in the tub and shower. Do not use towel bars as grab bars. ?Use non-skid mats or decals in the tub or shower. ?If you need to sit down in the shower, use a plastic, non-slip stool. ?Keep the floor dry. Clean up any water that spills on the floor as soon as it  happens. ?Remove soap buildup in the tub or shower regularly. ?Attach bath mats securely with double-sided non-slip rug tape. ?Do not have throw rugs and other things on the floor that can make you trip. ?What can I do in the bedroom? ?Use night lights. ?Make sure that you have a light by your bed that is easy to reach. ?Do not use any sheets or blankets that are too big for your bed. They should not hang down onto the floor. ?Have a firm chair that has side arms. You can use this for support while you get dressed. ?Do not have throw rugs and other things on the floor that can make you trip. ?What can I do in the kitchen? ?Clean up any spills right away. ?Avoid walking on wet floors. ?Keep items that you use a lot in easy-to-reach places. ?If you need to reach something above you, use a strong step stool that has a grab bar. ?Keep electrical cords out of the way. ?Do not use floor polish or wax that makes floors slippery. If you must use wax, use non-skid floor wax. ?Do not have throw rugs and other things on the floor that can make you trip. ?What can I do with my stairs? ?Do not leave any items on the stairs. ?Make sure that there are handrails on both sides of the stairs and use them. Fix handrails that are broken or loose. Make sure that handrails are  as long as the stairways. ?Check any carpeting to make sure that it is firmly attached to the stairs. Fix any carpet that is loose or worn. ?Avoid having throw rugs at the top or bottom of the stairs. If you do have throw rugs, attach them to the floor with carpet tape. ?Make sure that you have a light switch at the top of the stairs and the bottom of the stairs. If you do not have them, ask someone to add them for you. ?What else can I do to help prevent falls? ?Wear shoes that: ?Do not have high heels. ?Have rubber bottoms. ?Are comfortable and fit you well. ?Are closed at the toe. Do not wear sandals. ?If you use a stepladder: ?Make sure that it is fully opened.  Do not climb a closed stepladder. ?Make sure that both sides of the stepladder are locked into place. ?Ask someone to hold it for you, if possible. ?Clearly mark and make sure that you can see: ?Any grab bars

## 2021-05-06 NOTE — Progress Notes (Signed)
? ?Subjective:  ? Sandra [name] is a 81 y.o. female who presents for Medicare Annual (Subsequent) preventive examination. ? ?Virtual Visit via Telephone Note ? ?I connected with  Sandra [name] on 05/06/21 at 11:45 AM EDT by telephone and verified that I am speaking with the correct person using two identifiers. ? ?Location: ?Patient: home ?Provider: [facility] ?Persons participating in the virtual visit: patient/Nurse Health Advisor ?  ?I discussed the limitations, risks, security and privacy concerns of performing an evaluation and management service by telephone and the availability of in person appointments. The patient expressed understanding and agreed to proceed. ? ?Interactive audio and video telecommunications were attempted between this nurse and patient, however failed, due to patient having technical difficulties OR patient did not have access to video capability.  We continued and completed visit with audio only. ? ?Some vital signs may be absent or patient reported.  ? ?[name], LPN ? ? ?Review of Systems    ?Cardiac Risk Factors include: advanced age (>77men, >51 women);hypertension ? ?   ?Objective:  ?  ?There were no vitals filed for this visit. ?There is no height or weight on file to calculate BMI. ? ? ?  05/06/2021  ? 11:50 AM 04/21/2020  ?  3:11 PM 02/14/2019  ?  2:55 PM 08/19/2018  ? 10:32 AM 08/19/2018  ?  3:40 AM 11/08/2016  ? 11:06 AM 05/02/2016  ? 11:39 AM  ?Advanced Directives  ?Does Patient Have a Medical Advance Directive? No No No No No No No  ?Would patient like information on creating a medical advance directive? No - Patient declined No - Patient declined No - Patient declined No - Patient declined No - Patient declined    ? ? ?Current Medications (verified) ?Outpatient Encounter Medications as of 05/06/2021  ?Medication Sig  ? Cholecalciferol (VITAMIN D3) 50 MCG (2000 UT) capsule Take 1 capsule (2,000 Units total) by mouth daily.  ? lisinopril (ZESTRIL) 2.5 MG tablet Take 1 tablet  (2.5 mg total) by mouth daily.  ? Multiple Vitamin (MULTIVITAMIN) tablet Take 1 tablet by mouth daily.  ? ?No facility-administered encounter medications on file as of 05/06/2021.  ? ? ?Allergies (verified) ?Patient has no known allergies.  ? ?History: ?Past Medical History:  ?Diagnosis Date  ? Anxiety   ? Bell's palsy   ? Changes in skin texture   ? Hair loss   ? Hiatal hernia   ? Hypertension   ? Murmur, cardiac   ? Nocturia   ? Ovarian failure   ? ?Past Surgical History:  ?Procedure Laterality Date  ? ABDOMINAL HYSTERECTOMY    ? HIP SURGERY  01/22/2019  ? INTRAMEDULLARY (IM) NAIL INTERTROCHANTERIC Right 08/19/2018  ? Procedure: INTRAMEDULLARY (IM) NAIL INTERTROCHANTRIC;  Surgeon: Karleen Hampshire, MD;  Location: [facility] ORS;  Service: Orthopedics;  Laterality: Right;  ? MULTIPLE TOOTH EXTRACTIONS N/A   ? patient had all of her bottom teeth pulled about 2-3 weeks ago.  ? TONSILLECTOMY    ? ?Family History  ?Problem Relation Age of Onset  ? Hypertension Daughter   ? Hypertension Sister   ? Hypertension Brother   ? ?Social History  ? ?Socioeconomic History  ? Marital status: Widowed  ?  Spouse name: Not on file  ? Number of children: 2  ? Years of education: 69  ? Highest education level: High school graduate  ?Occupational History  ? Not on file  ?Tobacco Use  ? Smoking status: Never  ? Smokeless tobacco: Never  ?Vaping  Use  ? Vaping Use: Never used  ?Substance and Sexual Activity  ? Alcohol use: No  ?  Alcohol/week: 0.0 standard drinks  ? Drug use: No  ? Sexual activity: Not Currently  ?Other Topics Concern  ? Not on file  ?Social History Narrative  ? She lives in an independent living facility - [facility], but will spend winter with her sister in [place]   ?   ? 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   ? ?Social Determinants of Health  ? ?Financial Resource Strain: Low Risk   ? Difficulty of Paying Living Expenses: Not hard at all  ?Food Insecurity: No Food Insecurity  ? Worried About Programme researcher, broadcasting/film/videounning Out of Food in the Last  Year: Never true  ? Ran Out of Food in the Last Year: Never true  ?Transportation Needs: No Transportation Needs  ? Lack of Transportation (Medical): No  ? Lack of Transportation (Non-Medical): No  ?Physical Activity: Inactive  ? Days of Exercise per Week: 0 days  ? Minutes of Exercise per Session: 0 min  ?Stress: No Stress Concern Present  ? Feeling of Stress : Not at all  ?Social Connections: Moderately Isolated  ? Frequency of Communication with Friends and Family: More than three times a week  ? Frequency of Social Gatherings with Friends and Family: Twice a week  ? Attends Religious Services: More than 4 times per year  ? Active Member of Clubs or Organizations: No  ? Attends BankerClub or Organization Meetings: Never  ? Marital Status: Widowed  ? ? ?Tobacco Counseling ?Counseling given: Not Answered ? ? ?Clinical Intake: ? ?Pre-visit preparation completed: Yes ? ?Pain : No/denies pain ? ?  ? ?Nutritional Risks: None ?Diabetes: No ? ?How often do you need to have someone help you when you read instructions, pamphlets, or other written materials from your doctor or pharmacy?: 1 - Never ? ? ? ?Interpreter Needed?: No ? ?Information entered by :: Reather LittlerKasey Jaziah Goeller LPN ? ? ?Activities of Daily Living ? ?  05/06/2021  ? 11:51 AM 06/17/2020  ?  2:56 PM  ?In your present state of health, do you have any difficulty performing the following activities:  ?Hearing? 0 0  ?Vision? 0 0  ?Difficulty concentrating or making decisions? 0 0  ?Walking or climbing stairs? 0 0  ?Dressing or bathing? 0 0  ?Doing errands, shopping? 0 0  ?Preparing Food and eating ? N   ?Using the Toilet? N   ?In the past six months, have you accidently leaked urine? N   ?Do you have problems with loss of bowel control? N   ?Managing your Medications? N   ?Managing your Finances? N   ?Housekeeping or managing your Housekeeping? N   ? ? ?Patient Care Team: ?Alba CorySowles, Krichna, MD as PCP - General (Family Medicine) ? ?Indicate any recent Medical Services you may have  received from other than [facility] providers in the past year (date may be approximate). ? ?   ?Assessment:  ? This is a routine wellness examination for [place]. ? ?Hearing/Vision screen ?Hearing Screening - Comments:: Pt denies hearing difficulty ?Vision Screening - Comments:: [facility] screen[facility] done at [facility] ? ?Dietary issues and exercise activities discussed: ?Current Exercise Habits: The patient does not participate in regular exercise at present, Exercise limited by: None identified ? ? Goals Addressed   ?None ?  ? ?Depression Screen ? ?  05/06/2021  ? 11:49 AM 06/17/2020  ?  2:55 PM 04/21/2020  ?  3:10 PM 11/13/2019  ?  9:15 AM 05/14/2019  ? 10:38 AM 02/14/2019  ?  2:55 PM 11/13/2018  ? 10:04 AM  ?PHQ 2/9 Scores  ?PHQ - 2 Score 0 0 1 0 0 0 0  ?PHQ- 9 Score    0 0  0  ?  ?Fall Risk ? ?  05/06/2021  ? 11:51 AM 06/17/2020  ?  2:55 PM 04/21/2020  ?  3:12 PM 11/13/2019  ?  9:15 AM 02/14/2019  ?  2:57 PM  ?Fall Risk   ?Falls in the past year? 0 0 0 1 1  ?Number falls in past yr: 0 0 0 0 1  ?Injury with Fall? 0 0 0 0 1  ?Risk for fall due to : No Fall Risks  History of fall(s) History of fall(s);Impaired balance/gait Orthopedic patient;Impaired balance/gait;History of fall(s)  ?Follow up Falls prevention discussed  Falls prevention discussed  Falls prevention discussed  ? ? ?FALL RISK PREVENTION PERTAINING TO THE HOME: ? ?Any stairs in or around the home? Yes  ?If so, are there any without handrails? No  ?Home free of loose throw rugs in walkways, pet beds, electrical cords, etc? Yes  ?Adequate lighting in your home to reduce risk of falls? Yes  ? ?ASSISTIVE DEVICES UTILIZED TO PREVENT FALLS: ? ?Life alert? No  ?Use of a cane, walker or w/c? No  ?Grab bars in the bathroom? Yes  ?Shower chair or bench in shower? Yes  ?Elevated toilet seat or a handicapped toilet? Yes  ? ?TIMED UP AND GO: ? ?Was the test performed? No . Telephonic visit.  ? ?Cognitive Function: Normal cognitive status assessed by direct observation  by this Nurse Health Advisor. No abnormalities found.  ? ?  ?  ? ?  04/21/2020  ?  3:13 PM 02/14/2019  ?  2:59 PM  ?6CIT Screen  ?What Year? 0 points 0 points  ?What month? 0 points 0 points  ?What time? 0 p

## 2021-05-24 ENCOUNTER — Emergency Department: Payer: Medicare PPO

## 2021-05-24 ENCOUNTER — Encounter: Payer: Self-pay | Admitting: Emergency Medicine

## 2021-05-24 ENCOUNTER — Emergency Department
Admission: EM | Admit: 2021-05-24 | Discharge: 2021-05-24 | Disposition: A | Discharge status: Home / Self Care | Payer: Medicare PPO | Attending: Emergency Medicine | Admitting: Emergency Medicine

## 2021-05-24 ENCOUNTER — Other Ambulatory Visit: Payer: Self-pay

## 2021-05-24 DIAGNOSIS — E871 Hypo-osmolality and hyponatremia: Secondary | ICD-10-CM | POA: Insufficient documentation

## 2021-05-24 DIAGNOSIS — H2513 Age-related nuclear cataract, bilateral: Secondary | ICD-10-CM | POA: Diagnosis not present

## 2021-05-24 DIAGNOSIS — H4901 Third [oculomotor] nerve palsy, right eye: Secondary | ICD-10-CM | POA: Diagnosis not present

## 2021-05-24 DIAGNOSIS — R29810 Facial weakness: Secondary | ICD-10-CM | POA: Insufficient documentation

## 2021-05-24 DIAGNOSIS — H538 Other visual disturbances: Secondary | ICD-10-CM | POA: Diagnosis not present

## 2021-05-24 LAB — COMPREHENSIVE METABOLIC PANEL
ALT: 13 U/L (ref 0–44)
AST: 18 U/L (ref 15–41)
Albumin: 4.3 g/dL (ref 3.5–5.0)
Alkaline Phosphatase: 58 U/L (ref 38–126)
Anion gap: 9 (ref 5–15)
BUN: 13 mg/dL (ref 8–23)
CO2: 23 mmol/L (ref 22–32)
Calcium: 9.2 mg/dL (ref 8.9–10.3)
Chloride: 95 mmol/L — ABNORMAL LOW (ref 98–111)
Creatinine, Ser: 0.73 mg/dL (ref 0.44–1.00)
GFR, Estimated: >60 mL/min (ref >60)
Glucose, Bld: 102 mg/dL — ABNORMAL HIGH (ref 70–99)
Potassium: 3.8 mmol/L (ref 3.5–5.1)
Sodium: 127 mmol/L — ABNORMAL LOW (ref 135–145)
Total Bilirubin: 0.6 mg/dL (ref 0.3–1.2)
Total Protein: 7.8 g/dL (ref 6.5–8.1)

## 2021-05-24 LAB — CBC WITH DIFFERENTIAL/PLATELET
Abs Immature Granulocytes: 0.02 10*3/uL (ref 0.00–0.07)
Basophils Absolute: 0 10*3/uL (ref 0.0–0.1)
Basophils Relative: 1 %
Eosinophils Absolute: 0.1 10*3/uL (ref 0.0–0.5)
Eosinophils Relative: 1 %
HCT: 37.3 % (ref 36.0–46.0)
Hemoglobin: 12.7 g/dL (ref 12.0–15.0)
Immature Granulocytes: 0 %
Lymphocytes Relative: 21 %
Lymphs Abs: 1.7 10*3/uL (ref 0.7–4.0)
MCH: 30.8 pg (ref 26.0–34.0)
MCHC: 34 g/dL (ref 30.0–36.0)
MCV: 90.3 fL (ref 80.0–100.0)
Monocytes Absolute: 0.8 10*3/uL (ref 0.1–1.0)
Monocytes Relative: 10 %
Neutro Abs: 5.3 10*3/uL (ref 1.7–7.7)
Neutrophils Relative %: 67 %
Platelets: 231 10*3/uL (ref 150–400)
RBC: 4.13 MIL/uL (ref 3.87–5.11)
RDW: 12.3 % (ref 11.5–15.5)
WBC: 7.9 10*3/uL (ref 4.0–10.5)
nRBC: 0 % (ref 0.0–0.2)

## 2021-05-24 LAB — SEDIMENTATION RATE: Sed Rate: 24 mm/hr (ref 0–30)

## 2021-05-24 NOTE — ED Provider Notes (Signed)
? ?[facility] ?Provider Note ? ? ? Event Date/Time  ? First MD Initiated Contact with Patient 05/24/21 2137   ?  (approximate) ? ? ?History  ? ?Blurred Vision ? ? ?HPI ? ?Sandra [name] is a 81 y.o. female   here with right facial droop.  The patient was sent in from ophthalmology for acute right 3rd nerve palsy.  She states that this happened over the last several days, and has not necessarily change since onset.  She states that she went to ophthalmologist today.  Her daughter actually works for [employer].  She was sent here for possible aneurysm or further ischemic work-up.  Denies any pain.  Denies any associated focal numbness or weakness.  No history of similar issues.  She does have history of Bell's palsy, but no history of other cranial nerve palsy.  No recent illnesses.  No recent medication changes.  No recent immunizations.. ? ?  ? ? ?Physical Exam  ? ?Triage Vital Signs: ?ED Triage Vitals  ?Enc Vitals Group  ?   BP 05/24/21 1621 (!) 172/65  ?   Pulse Rate 05/24/21 1621 64  ?   Resp 05/24/21 1621 20  ?   Temp 05/24/21 1621 98.1 ?F (36.7 ?C)  ?   Temp Source 05/24/21 1621 Oral  ?   SpO2 05/24/21 1621 97 %  ?   Weight 05/24/21 1620 130 lb (59 kg)  ?   Height 05/24/21 1620 5\' 1"  (1.549 m)  ?   Head Circumference --   ?   Peak Flow --   ?   Pain Score 05/24/21 1619 0  ?   Pain Loc --   ?   Pain Edu? --   ?   Excl. in GC? --   ? ? ?Most recent vital signs: ?Vitals:  ? 05/24/21 1621 05/24/21 2204  ?BP: (!) 172/65 (!) 141/63  ?Pulse: 64 64  ?Resp: 20 17  ?Temp: 98.1 ?F (36.7 ?C)   ?SpO2: 97% 98%  ? ? ? ?General: Awake, no distress.  ?CV:  Good peripheral perfusion.  ?Resp:  Normal effort.  ?Abd:  No distention.  ?Other:  Right sided ptosis noted.  Right eye with exotropia.  Cranial nerves otherwise fully intact.  Strength out of 5 bilateral upper and lower extremities.  Normal sensation to light touch.  Gait normal. ? ? ?ED Results / Procedures / Treatments  ? ?Labs ?(all labs  ordered are listed, but only abnormal results are displayed) ?Labs Reviewed  ?COMPREHENSIVE METABOLIC PANEL - Abnormal; Notable for the following components:  ?    Result Value  ? Sodium 127 (*)   ? Chloride 95 (*)   ? Glucose, Bld 102 (*)   ? All other components within normal limits  ?CBC WITH DIFFERENTIAL/PLATELET  ?SEDIMENTATION RATE  ?C-REACTIVE PROTEIN  ? ? ? ?EKG ?Normal sinus rhythm, ventricular rate 61.  PR 140, QRS 78, QTc 412.  No acute ST elevations or depressions.  No ischemia or infarct. ? ? ?RADIOLOGY ?MRI: No acute process, no intracranial large vessel occlusion or severe stenosis. ? ? ?I also independently reviewed and agree with radiologist interpretations. ? ? ?PROCEDURES: ? ?Critical Care performed: No ? ? ? ? ?MEDICATIONS ORDERED IN ED: ?Medications - No data to display ? ? ?IMPRESSION / MDM / ASSESSMENT AND PLAN / ED COURSE  ?I reviewed the triage vital signs and the nursing notes. ?             ?               ? ? ?  The patient is on the cardiac monitor to evaluate for evidence of arrhythmia and/or significant heart rate changes. ? ? ?Ddx:  ?Differential includes the following, with pertinent life- or limb-threatening emergencies considered: ? ?Cerebral aneurysm, ischemic stroke, isolated 3rd nerve palsy, myelopathy ? ? ?MDM:  ?81 year old female here with what appears to be an isolated 3rd nerve palsy based on exam as well as ophthalmology evaluation prior to arrival.  Patient is otherwise well-appearing.  No other focal neurological doses.  Screening lab work reviewed, shows normal white blood cell count and hemoglobin.  Sodium 127.  She has had an extensive history of chronic hyponatremia, and reportedly drinks a significant amount of water daily.  Encouraged her to decrease this and increase salt intake.  Otherwise, MRI obtained due to isolated 3rd nerve palsy.  MRI is reviewed, showed no evidence of acute abnormality or large vessel occlusion or stenosis.  No aneurysm.  Patient otherwise  well. ? ?We discussed her MRI findings as well as her sodium.  She would like to continue managing this as an outpatient which I think is reasonable.  We will have her call her ophthalmologist tomorrow to arrange follow-up.  She will decrease her free water intake and increase salt intake.  Follow-up with PCP this week for repeat labs. ? ? ?MEDICATIONS GIVEN IN ED: ?Medications - No data to display ? ? ?Consults:  ? ? ? ?EMR reviewed  ?Reviewed prior labs from PCP visits.  Reviewed PCP visit from June 2022 ? ? ? ? ?FINAL CLINICAL IMPRESSION(S) / ED DIAGNOSES  ? ?Final diagnoses:  ?Right oculomotor nerve palsy  ?Chronic hyponatremia  ? ? ? ?Rx / DC Orders  ? ?ED Discharge Orders   ? ? None  ? ?  ? ? ? ?Note:  This document was prepared using Dragon voice recognition software and may include unintentional dictation errors. ?  ?Shaune Pollack, MD ?05/24/21 2340 ? ?

## 2021-05-24 NOTE — Discharge Instructions (Signed)
For your eye: ? ?Call your eye doctor tomorrow to let them know your MRI was negative ? ?For your sodium: ? ?This has been an ongoing issue. I'd recommend increasing salt intake over the next few days, hydrating, and following up with your doctor in the next week for repeat. ?

## 2021-05-24 NOTE — ED Triage Notes (Signed)
Pt via POV from home. Pt here from [facility]. Pt c/o itchy eye, redness, and blurred vision in the R eye that started Friday, daughter thought it was allergies. Denies any numbness or tingling. Pt was sent over to rule out a stroke. Pt is A&OX4 and NAD, pt very anxious about admission.  ?

## 2021-05-25 ENCOUNTER — Telehealth: Payer: Self-pay

## 2021-05-25 LAB — C-REACTIVE PROTEIN: CRP: 0.5 mg/dL (ref <1.0)

## 2021-05-25 NOTE — Telephone Encounter (Signed)
Lvm for pt to call and schedule an appt  ? ? ? ?Copied from CRM [id number]. Topic: Appointment Scheduling - Scheduling Inquiry for Clinic ?>> May 25, 2021 10:36 AM Elliot Gault wrote: ?Reason for CRM: Patient was seen at [facility] hospital on 05/24/2021 and advised to follow with PCP. PCP has no available appointments until June. Please call patient to schedule ?

## 2021-05-25 NOTE — Telephone Encounter (Signed)
Pt called back and is already scheduled  ?

## 2021-06-01 NOTE — Progress Notes (Deleted)
Name: Sandra [name]   MRN: [id number]    DOB: 09-29-1940   Date:06/01/2021       Progress Note  Subjective  Chief Complaint  Hospital Follow up   HPI  *** Patient Active Problem List   Diagnosis Date Noted   History of fracture of right hip 05/14/2019   Dyslipidemia 05/14/2019   Age-related osteoporosis without current pathological fracture 08/27/2018   Hyponatremia 08/27/2018   Hiatal hernia 10/27/2014   Hypertension, benign 10/27/2014   Female pattern hair loss 10/27/2014   Nocturia 10/27/2014   Left-sided Bell's palsy 10/27/2014   Generalized anxiety disorder 10/27/2014    Past Surgical History:  Procedure Laterality Date   ABDOMINAL HYSTERECTOMY     HIP SURGERY  01/22/2019   INTRAMEDULLARY (IM) NAIL INTERTROCHANTERIC Right 08/19/2018   Procedure: INTRAMEDULLARY (IM) NAIL INTERTROCHANTRIC;  Surgeon: Karleen Hampshire, MD;  Location: [facility] ORS;  Service: Orthopedics;  Laterality: Right;   MULTIPLE TOOTH EXTRACTIONS N/A    patient had all of her bottom teeth pulled about 2-3 weeks ago.   TONSILLECTOMY      Family History  Problem Relation Age of Onset   Hypertension Daughter    Hypertension Sister    Hypertension Brother     Social History   Tobacco Use   Smoking status: Never   Smokeless tobacco: Never  Substance Use Topics   Alcohol use: No    Alcohol/week: 0.0 standard drinks     Current Outpatient Medications:    Cholecalciferol (VITAMIN D3) 50 MCG (2000 UT) capsule, Take 1 capsule (2,000 Units total) by mouth daily., Disp: 30 capsule, Rfl: 3   lisinopril (ZESTRIL) 2.5 MG tablet, Take 1 tablet (2.5 mg total) by mouth daily., Disp: 90 tablet, Rfl: 3   Multiple Vitamin (MULTIVITAMIN) tablet, Take 1 tablet by mouth daily., Disp: 30 tablet, Rfl: 3  No Known Allergies  I personally reviewed {Reviewed:14835} with the patient/caregiver today.   ROS  ***  Objective  There were no vitals filed for this visit.  There is no height or weight on file to  calculate BMI.  Physical Exam ***  Recent Results (from the past 2160 hour(s))  Comprehensive metabolic panel     Status: Abnormal   Collection Time: 05/24/21  4:27 PM  Result Value Ref Range   Sodium 127 (L) 135 - 145 mmol/L   Potassium 3.8 3.5 - 5.1 mmol/L   Chloride 95 (L) 98 - 111 mmol/L   CO2 23 22 - 32 mmol/L   Glucose, Bld 102 (H) 70 - 99 mg/dL    Comment: Glucose reference range applies only to samples taken after fasting for at least 8 hours.   BUN 13 8 - 23 mg/dL   Creatinine, Ser 8.78 0.44 - 1.00 mg/dL   Calcium 9.2 8.9 - 67.6 mg/dL   Total Protein 7.8 6.5 - 8.1 g/dL   Albumin 4.3 3.5 - 5.0 g/dL   AST 18 15 - 41 U/L   ALT 13 0 - 44 U/L   Alkaline Phosphatase 58 38 - 126 U/L   Total Bilirubin 0.6 0.3 - 1.2 mg/dL   GFR, Estimated >72 >09 mL/min    Comment: (NOTE) Calculated using the CKD-EPI Creatinine Equation (2021)    Anion gap 9 5 - 15    Comment: Performed at [facility], [address] [place], [place] [address]  CBC with Differential     Status: None   Collection Time: 05/24/21  4:27 PM  Result Value Ref Range  WBC 7.9 4.0 - 10.5 K/uL   RBC 4.13 3.87 - 5.11 MIL/uL   Hemoglobin 12.7 12.0 - 15.0 g/dL   HCT 10.1 75.1 - 02.5 %   MCV 90.3 80.0 - 100.0 fL   MCH 30.8 26.0 - 34.0 pg   MCHC 34.0 30.0 - 36.0 g/dL   RDW 85.2 77.8 - 24.2 %   Platelets 231 150 - 400 K/uL   nRBC 0.0 0.0 - 0.2 %   Neutrophils Relative % 67 %   Neutro Abs 5.3 1.7 - 7.7 K/uL   Lymphocytes Relative 21 %   Lymphs Abs 1.7 0.7 - 4.0 K/uL   Monocytes Relative 10 %   Monocytes Absolute 0.8 0.1 - 1.0 K/uL   Eosinophils Relative 1 %   Eosinophils Absolute 0.1 0.0 - 0.5 K/uL   Basophils Relative 1 %   Basophils Absolute 0.0 0.0 - 0.1 K/uL   Immature Granulocytes 0 %   Abs Immature Granulocytes 0.02 0.00 - 0.07 K/uL    Comment: Performed at [facility], [address] [place], [place] [address]  C-reactive protein     Status: None   Collection Time: 05/24/21   4:27 PM  Result Value Ref Range   CRP 0.5 <1.0 mg/dL    Comment: Performed at [facility], [address] [place], [place] [address]  Sedimentation rate     Status: None   Collection Time: 05/24/21  4:27 PM  Result Value Ref Range   Sed Rate 24 0 - 30 mm/hr    Comment: Performed at [facility], [address] [place], [place] [address]    Diabetic Foot Exam: Diabetic Foot Exam - Simple   No data filed    ***  PHQ2/9:    05/06/2021   11:49 AM 06/17/2020    2:55 PM 04/21/2020    3:10 PM 11/13/2019    9:15 AM 05/14/2019   10:38 AM  Depression screen PHQ 2/9  Decreased Interest 0 0 0 0 0  Down, Depressed, Hopeless 0 0 1 0 0  PHQ - 2 Score 0 0 1 0 0  Altered sleeping    0 0  Tired, decreased energy    0 0  Change in appetite    0 0  Feeling bad or failure about yourself     0 0  Trouble concentrating    0 0  Moving slowly or fidgety/restless    0 0  Suicidal thoughts    0 0  PHQ-9 Score    0 0    phq 9 is {gen pos [id number]} ***  Fall Risk:    05/06/2021   11:51 AM 06/17/2020    2:55 PM 04/21/2020    3:12 PM 11/13/2019    9:15 AM 02/14/2019    2:57 PM  Fall Risk   Falls in the past year? 0 0 0 1 1  Number falls in past yr: 0 0 0 0 1  Injury with Fall? 0 0 0 0 1  Risk for fall due to : No Fall Risks  History of fall(s) History of fall(s);Impaired balance/gait Orthopedic patient;Impaired balance/gait;History of fall(s)  Follow up Falls prevention discussed  Falls prevention discussed  Falls prevention discussed   ***   Functional Status Survey:   ***   Assessment & Plan  *** There are no diagnoses linked to this encounter.

## 2021-06-02 ENCOUNTER — Inpatient Hospital Stay: Payer: Medicare PPO | Admitting: Family Medicine

## 2021-06-02 ENCOUNTER — Emergency Department: Payer: Medicare PPO

## 2021-06-02 ENCOUNTER — Other Ambulatory Visit: Payer: Self-pay

## 2021-06-02 ENCOUNTER — Inpatient Hospital Stay
Admission: EM | Admit: 2021-06-02 | Discharge: 2021-06-07 | DRG: 522 | Disposition: A | Discharge status: Skilled Nursing Facility | Payer: Medicare PPO | Attending: Internal Medicine | Admitting: Internal Medicine

## 2021-06-02 DIAGNOSIS — R630 Anorexia: Secondary | ICD-10-CM | POA: Diagnosis present

## 2021-06-02 DIAGNOSIS — E222 Syndrome of inappropriate secretion of antidiuretic hormone: Secondary | ICD-10-CM | POA: Diagnosis not present

## 2021-06-02 DIAGNOSIS — Z7401 Bed confinement status: Secondary | ICD-10-CM | POA: Diagnosis not present

## 2021-06-02 DIAGNOSIS — Z20822 Contact with and (suspected) exposure to covid-19: Secondary | ICD-10-CM | POA: Diagnosis not present

## 2021-06-02 DIAGNOSIS — F419 Anxiety disorder, unspecified: Secondary | ICD-10-CM | POA: Diagnosis present

## 2021-06-02 DIAGNOSIS — S72012A Unspecified intracapsular fracture of left femur, initial encounter for closed fracture: Secondary | ICD-10-CM | POA: Diagnosis not present

## 2021-06-02 DIAGNOSIS — R079 Chest pain, unspecified: Secondary | ICD-10-CM | POA: Diagnosis not present

## 2021-06-02 DIAGNOSIS — M25562 Pain in left knee: Secondary | ICD-10-CM | POA: Diagnosis present

## 2021-06-02 DIAGNOSIS — S72002A Fracture of unspecified part of neck of left femur, initial encounter for closed fracture: Principal | ICD-10-CM

## 2021-06-02 DIAGNOSIS — R011 Cardiac murmur, unspecified: Secondary | ICD-10-CM | POA: Diagnosis present

## 2021-06-02 DIAGNOSIS — D62 Acute posthemorrhagic anemia: Secondary | ICD-10-CM | POA: Diagnosis not present

## 2021-06-02 DIAGNOSIS — I959 Hypotension, unspecified: Secondary | ICD-10-CM | POA: Diagnosis not present

## 2021-06-02 DIAGNOSIS — Z79899 Other long term (current) drug therapy: Secondary | ICD-10-CM

## 2021-06-02 DIAGNOSIS — G510 Bell's palsy: Secondary | ICD-10-CM | POA: Diagnosis not present

## 2021-06-02 DIAGNOSIS — I10 Essential (primary) hypertension: Secondary | ICD-10-CM

## 2021-06-02 DIAGNOSIS — S72002K Fracture of unspecified part of neck of left femur, subsequent encounter for closed fracture with nonunion: Secondary | ICD-10-CM | POA: Diagnosis not present

## 2021-06-02 DIAGNOSIS — S72009A Fracture of unspecified part of neck of unspecified femur, initial encounter for closed fracture: Secondary | ICD-10-CM | POA: Diagnosis present

## 2021-06-02 DIAGNOSIS — E871 Hypo-osmolality and hyponatremia: Secondary | ICD-10-CM | POA: Diagnosis not present

## 2021-06-02 DIAGNOSIS — M25552 Pain in left hip: Secondary | ICD-10-CM | POA: Diagnosis not present

## 2021-06-02 DIAGNOSIS — Z96642 Presence of left artificial hip joint: Secondary | ICD-10-CM | POA: Diagnosis not present

## 2021-06-02 DIAGNOSIS — S72002D Fracture of unspecified part of neck of left femur, subsequent encounter for closed fracture with routine healing: Secondary | ICD-10-CM | POA: Diagnosis not present

## 2021-06-02 DIAGNOSIS — S79912A Unspecified injury of left hip, initial encounter: Secondary | ICD-10-CM | POA: Diagnosis not present

## 2021-06-02 DIAGNOSIS — W109XXA Fall (on) (from) unspecified stairs and steps, initial encounter: Secondary | ICD-10-CM | POA: Diagnosis present

## 2021-06-02 DIAGNOSIS — F411 Generalized anxiety disorder: Secondary | ICD-10-CM | POA: Diagnosis not present

## 2021-06-02 DIAGNOSIS — Z8249 Family history of ischemic heart disease and other diseases of the circulatory system: Secondary | ICD-10-CM

## 2021-06-02 DIAGNOSIS — R9431 Abnormal electrocardiogram [ECG] [EKG]: Secondary | ICD-10-CM | POA: Diagnosis not present

## 2021-06-02 DIAGNOSIS — W19XXXA Unspecified fall, initial encounter: Secondary | ICD-10-CM | POA: Diagnosis not present

## 2021-06-02 DIAGNOSIS — E782 Mixed hyperlipidemia: Secondary | ICD-10-CM | POA: Diagnosis not present

## 2021-06-02 DIAGNOSIS — W19XXXD Unspecified fall, subsequent encounter: Secondary | ICD-10-CM | POA: Diagnosis not present

## 2021-06-02 HISTORY — DX: Fracture of unspecified part of neck of unspecified femur, initial encounter for closed fracture: S72.009A

## 2021-06-02 LAB — COMPREHENSIVE METABOLIC PANEL
ALT: 14 U/L (ref 0–44)
AST: 21 U/L (ref 15–41)
Albumin: 4.2 g/dL (ref 3.5–5.0)
Alkaline Phosphatase: 55 U/L (ref 38–126)
Anion gap: 9 (ref 5–15)
BUN: 16 mg/dL (ref 8–23)
CO2: 25 mmol/L (ref 22–32)
Calcium: 9.5 mg/dL (ref 8.9–10.3)
Chloride: 98 mmol/L (ref 98–111)
Creatinine, Ser: 0.85 mg/dL (ref 0.44–1.00)
GFR, Estimated: >60 mL/min (ref >60)
Glucose, Bld: 112 mg/dL — ABNORMAL HIGH (ref 70–99)
Potassium: 3.9 mmol/L (ref 3.5–5.1)
Sodium: 132 mmol/L — ABNORMAL LOW (ref 135–145)
Total Bilirubin: 0.8 mg/dL (ref 0.3–1.2)
Total Protein: 7.6 g/dL (ref 6.5–8.1)

## 2021-06-02 LAB — CBC
HCT: 37.5 % (ref 36.0–46.0)
Hemoglobin: 12.8 g/dL (ref 12.0–15.0)
MCH: 31 pg (ref 26.0–34.0)
MCHC: 34.1 g/dL (ref 30.0–36.0)
MCV: 90.8 fL (ref 80.0–100.0)
Platelets: 234 10*3/uL (ref 150–400)
RBC: 4.13 MIL/uL (ref 3.87–5.11)
RDW: 12.6 % (ref 11.5–15.5)
WBC: 7.1 10*3/uL (ref 4.0–10.5)
nRBC: 0 % (ref 0.0–0.2)

## 2021-06-02 LAB — SAMPLE TO BLOOD BANK

## 2021-06-02 MED ORDER — METHOCARBAMOL 1000 MG/10ML IJ SOLN
500.0000 mg | Freq: Four times a day (QID) | INTRAVENOUS | Status: DC | PRN
  Filled 2021-06-02: qty 5

## 2021-06-02 MED ORDER — TRANEXAMIC ACID-NACL 1000-0.7 MG/100ML-% IV SOLN
1000.0000 mg | INTRAVENOUS | Status: AC
  Administered 2021-06-03: 1000 mg via INTRAVENOUS
  Filled 2021-06-02: qty 100

## 2021-06-02 MED ORDER — SENNA 8.6 MG PO TABS
1.0000 | ORAL_TABLET | Freq: Two times a day (BID) | ORAL | Status: DC
  Administered 2021-06-03 – 2021-06-06 (×6): 8.6 mg via ORAL
  Filled 2021-06-02 (×9): qty 1

## 2021-06-02 MED ORDER — CEFAZOLIN SODIUM-DEXTROSE 2-4 GM/100ML-% IV SOLN
2.0000 g | INTRAVENOUS | Status: AC
  Administered 2021-06-03: 2 g via INTRAVENOUS

## 2021-06-02 MED ORDER — CALCIUM CARBONATE ANTACID 500 MG PO CHEW
2.0000 | CHEWABLE_TABLET | Freq: Once | ORAL | Status: AC
  Administered 2021-06-02: 400 mg via ORAL
  Filled 2021-06-02: qty 2

## 2021-06-02 MED ORDER — PANTOPRAZOLE SODIUM 40 MG PO TBEC
40.0000 mg | DELAYED_RELEASE_TABLET | Freq: Every day | ORAL | Status: DC
  Administered 2021-06-02 – 2021-06-07 (×5): 40 mg via ORAL
  Filled 2021-06-02 (×5): qty 1

## 2021-06-02 MED ORDER — HYDROCODONE-ACETAMINOPHEN 5-325 MG PO TABS
1.0000 | ORAL_TABLET | Freq: Four times a day (QID) | ORAL | Status: DC | PRN
  Administered 2021-06-02: 2 via ORAL
  Administered 2021-06-02: 1 via ORAL
  Administered 2021-06-03 – 2021-06-05 (×3): 2 via ORAL
  Administered 2021-06-05: 1 via ORAL
  Administered 2021-06-06: 2 via ORAL
  Administered 2021-06-06 – 2021-06-07 (×2): 1 via ORAL
  Filled 2021-06-02: qty 2
  Filled 2021-06-02 (×2): qty 1
  Filled 2021-06-02 (×2): qty 2
  Filled 2021-06-02 (×2): qty 1
  Filled 2021-06-02 (×4): qty 2

## 2021-06-02 MED ORDER — ADULT MULTIVITAMIN W/MINERALS CH
1.0000 | ORAL_TABLET | Freq: Every day | ORAL | Status: DC
  Administered 2021-06-04 – 2021-06-07 (×4): 1 via ORAL
  Filled 2021-06-02 (×4): qty 1

## 2021-06-02 MED ORDER — FENTANYL CITRATE PF 50 MCG/ML IJ SOSY
25.0000 ug | PREFILLED_SYRINGE | INTRAMUSCULAR | Status: DC | PRN
  Administered 2021-06-02 (×2): 25 ug via INTRAVENOUS
  Filled 2021-06-02 (×2): qty 1

## 2021-06-02 MED ORDER — LISINOPRIL 5 MG PO TABS
2.5000 mg | ORAL_TABLET | Freq: Every day | ORAL | Status: DC
  Administered 2021-06-04 – 2021-06-07 (×4): 2.5 mg via ORAL
  Filled 2021-06-02 (×4): qty 1

## 2021-06-02 MED ORDER — CALCIUM CARBONATE ANTACID 500 MG PO CHEW
400.0000 mg | CHEWABLE_TABLET | Freq: Once | ORAL | Status: AC
  Administered 2021-06-02: 400 mg via ORAL
  Filled 2021-06-02: qty 2

## 2021-06-02 MED ORDER — VITAMIN D 25 MCG (1000 UNIT) PO TABS
2000.0000 [IU] | ORAL_TABLET | Freq: Every day | ORAL | Status: DC
  Administered 2021-06-04 – 2021-06-07 (×4): 2000 [IU] via ORAL
  Filled 2021-06-02 (×4): qty 2

## 2021-06-02 MED ORDER — METHOCARBAMOL 500 MG PO TABS
500.0000 mg | ORAL_TABLET | Freq: Four times a day (QID) | ORAL | Status: DC | PRN
  Administered 2021-06-02 – 2021-06-03 (×2): 500 mg via ORAL
  Filled 2021-06-02 (×2): qty 1

## 2021-06-02 MED ORDER — MORPHINE SULFATE (PF) 2 MG/ML IV SOLN
0.5000 mg | INTRAVENOUS | Status: DC | PRN
  Administered 2021-06-02 – 2021-06-04 (×4): 0.5 mg via INTRAVENOUS
  Filled 2021-06-02 (×4): qty 1

## 2021-06-02 NOTE — ED Provider Notes (Signed)
? ?[facility] ?Provider Note ? ? ? Event Date/Time  ? First MD Initiated Contact with Patient 06/02/21 1056   ?  (approximate) ? ? ?History  ? ?Fall ? ? ?HPI ? ?[place] Beumer is a 81 y.o. female presenting from facility after witnessed mechanical fall.  Did not hit her head denies any neck pain.  She complaining of left hip and knee pain.  Unable to ambulate.  Not on any blood thinners.  Denies any chest pain or pressure no nausea or vomiting.  States the pain is mild to moderate. ?  ? ? ?Physical Exam  ? ?Triage Vital Signs: ?ED Triage Vitals  ?Enc Vitals Group  ?   BP 06/02/21 1101 (!) 141/67  ?   Pulse Rate 06/02/21 1101 74  ?   Resp 06/02/21 1101 17  ?   Temp 06/02/21 1101 98.1 ?F (36.7 ?C)  ?   Temp Source 06/02/21 1101 Oral  ?   SpO2 06/02/21 1101 100 %  ?   Weight 06/02/21 1102 137 lb (62.1 kg)  ?   Height 06/02/21 1102 5\' 2"  (1.575 m)  ?   Head Circumference --   ?   Peak Flow --   ?   Pain Score 06/02/21 1101 3  ?   Pain Loc --   ?   Pain Edu? --   ?   Excl. in GC? --   ? ? ?Most recent vital signs: ?Vitals:  ? 06/02/21 1101  ?BP: (!) 141/67  ?Pulse: 74  ?Resp: 17  ?Temp: 98.1 ?F (36.7 ?C)  ?SpO2: 100%  ? ? ? ?Constitutional: Alert  ?Eyes: Conjunctivae are normal.  ?Head: Atraumatic. ?Nose: No congestion/rhinnorhea. ?Mouth/Throat: Mucous membranes are moist.   ?Neck: Painless ROM.  ?Cardiovascular:   Good peripheral circulation. ?Respiratory: Normal respiratory effort.  No retractions.  ?Gastrointestinal: Soft and nontender.  ?Musculoskeletal: Pain with logroll of left hip with left lower extremity shortened and externally rotated.  Neurovascularly intact distally. ?Neurologic:  MAE spontaneously. No gross focal neurologic deficits are appreciated.  ?Skin:  Skin is warm, dry and intact. No rash noted. ?Psychiatric: Mood and affect are normal. Speech and behavior are normal. ? ? ? ?ED Results / Procedures / Treatments  ? ?Labs ?(all labs ordered are listed, but only abnormal  results are displayed) ?Labs Reviewed  ?COMPREHENSIVE METABOLIC PANEL - Abnormal; Notable for the following components:  ?    Result Value  ? Sodium 132 (*)   ? Glucose, Bld 112 (*)   ? All other components within normal limits  ?CBC  ?SAMPLE TO BLOOD BANK  ? ? ? ?EKG ? ?ED ECG REPORT ?I, 06/04/21, the attending physician, personally viewed and interpreted this ECG. ? ? Date: 06/02/2021 ? EKG Time: 11:00 ? Rate: 70 ? Rhythm: sinus ? Axis: normal ? Intervals:  normal intervals ? ST&T Change: no stemi, no depression ? ? ? ?RADIOLOGY ?Please see ED Course for my review and interpretation. ? ?I personally reviewed all radiographic images ordered to evaluate for the above acute complaints and reviewed radiology reports and findings.  These findings were personally discussed with the patient.  Please see medical record for radiology report. ? ? ? ?PROCEDURES: ? ?Critical Care performed: No ? ?Procedures ? ? ?MEDICATIONS ORDERED IN ED: ?Medications  ?fentaNYL (SUBLIMAZE) injection 25 mcg (25 mcg Intravenous Given 06/02/21 1123)  ? ? ? ?IMPRESSION / MDM / ASSESSMENT AND PLAN / ED COURSE  ?I reviewed the triage vital signs  and the nursing notes. ?             ?               ? ?Differential diagnosis includes, but is not limited to, fracture, dislocation, contusion, electrolyte abnormality ? ?Presented to ER for evaluation after mechanical fall.  Imaging will be ordered for above differential will order blood work.  Patient given IV fentanyl for pain. ? ?Clinical Course as of 06/02/21 1200  ?Wed Jun 02, 2021  ?1153 X-ray of hip on my interpretation shows evidence of left hip fracture.  Case discussed in consultation with Dr. Odis Luster of orthopedics.  I will consult hospitalist for admission. [PR]  ?  ?Clinical Course User Index ?[PR] Willy Eddy, MD  ? ? ? ?FINAL CLINICAL IMPRESSION(S) / ED DIAGNOSES  ? ?Final diagnoses:  ?Closed displaced fracture of left femoral neck (HCC)  ? ? ? ?Rx / DC Orders  ? ?ED Discharge  Orders   ? ? None  ? ?  ? ? ? ?Note:  This document was prepared using Dragon voice recognition software and may include unintentional dictation errors. ? ?  ?Willy Eddy, MD ?06/02/21 1200 ? ?

## 2021-06-02 NOTE — ED Notes (Signed)
Informed RN bed assigned 

## 2021-06-02 NOTE — H&P (Signed)
?History and Physical  ? ? ?Patient: Sandra [name] [id number] DOB: July 24, 1940 ?DOA: 06/02/2021 ?DOS: the patient was seen and examined on 06/02/2021 ?PCP: Sandra Sizer, MD  ?Patient coming from: Home ? ?Chief Complaint:  ?Chief Complaint  ?Patient presents with  ? Fall  ? ?HPI: Sandra [name] is a 81 y.o. female with medical history significant for hypertension, anxiety disorder, hiatal hernia who presents to the ER via EMS from an assisted living facility after she had a witnessed ground-level mechanical fall.  Patient reports falling down 1 step.  She denies hitting her head and denies any loss of consciousness.  She was unable to get up or bear weight on that left leg due to severe pain.  She rates her pain an 8 x 10 in intensity with any form of movement.  Her family was there to take her to an eye appointment when this happened.  She denied having any chest pain, no shortness of breath, no dizziness or lightheadedness. ?She denies having any diarrhea, no nausea, no vomiting, no changes in her bowel habits, no fever, no cough, no chills, no leg swelling, no headache, no focal deficits or blurred vision. ? ?Review of Systems: As mentioned in the history of present illness. All other systems reviewed and are negative. ?Past Medical History:  ?Diagnosis Date  ? Anxiety   ? Bell's palsy   ? Changes in skin texture   ? Hair loss   ? Hiatal hernia   ? Hypertension   ? Murmur, cardiac   ? Nocturia   ? Ovarian failure   ? ?Past Surgical History:  ?Procedure Laterality Date  ? ABDOMINAL HYSTERECTOMY    ? HIP SURGERY  01/22/2019  ? INTRAMEDULLARY (IM) NAIL INTERTROCHANTERIC Right 08/19/2018  ? Procedure: INTRAMEDULLARY (IM) NAIL INTERTROCHANTRIC;  Surgeon: Nida Boatman, MD;  Location: [facility] ORS;  Service: Orthopedics;  Laterality: Right;  ? MULTIPLE TOOTH EXTRACTIONS N/A   ? patient had all of her bottom teeth pulled about 2-3 weeks ago.  ? TONSILLECTOMY    ? ?Social History:  reports that she has never smoked.  She has never used smokeless tobacco. She reports that she does not drink alcohol and does not use drugs. ? ?No Known Allergies ? ?Family History  ?Problem Relation Age of Onset  ? Hypertension Daughter   ? Hypertension Sister   ? Hypertension [name]   ? ? ?Prior to Admission medications   ?Medication Sig Start Date End Date Taking? Authorizing Provider  ?Cholecalciferol (VITAMIN D3) 50 MCG (2000 UT) capsule Take 1 capsule (2,000 Units total) by mouth daily. 04/23/20   Sandra Sizer, MD  ?lisinopril (ZESTRIL) 2.5 MG tablet Take 1 tablet (2.5 mg total) by mouth daily. 06/17/20   Sandra Sizer, MD  ?Multiple Vitamin (MULTIVITAMIN) tablet Take 1 tablet by mouth daily. 04/23/20   Sandra Sizer, MD  ? ? ?Physical Exam: ? ?Physical Exam ?Vitals and nursing note reviewed.  ?Constitutional:   ?   Appearance: Normal appearance.  ?HENT:  ?   Head: Normocephalic and atraumatic.  ?   Nose: Nose normal.  ?   Mouth/Throat:  ?   Mouth: Mucous membranes are moist.  ?Eyes:  ?   Pupils: Pupils are equal, round, and reactive to light.  ?Cardiovascular:  ?   Rate and Rhythm: Normal rate and regular rhythm.  ?Pulmonary:  ?   Effort: Pulmonary effort is normal.  ?   Breath sounds: Normal breath sounds.  ?Abdominal:  ?   General: Abdomen is  flat.  ?Musculoskeletal:  ?   Cervical back: Normal range of motion and neck supple.  ?   Comments: Decreased range of motion left hip  ?Skin: ?   General: Skin is warm and dry.  ?Neurological:  ?   General: No focal deficit present.  ?   Mental Status: She is alert and oriented to person, place, and time.  ?Psychiatric:     ?   Mood and Affect: Mood normal.     ?   Behavior: Behavior normal.  ? ? ?Data Reviewed: ?Relevant notes from primary care and specialist visits, past discharge summaries as available in EHR, including Care Everywhere. ?Prior diagnostic testing as pertinent to current admission diagnoses ?Updated medications and problem lists for reconciliation ?ED course, including vitals, labs,  imaging, treatment and response to treatment ?Triage notes, nursing and pharmacy notes and ED provider's notes ?Notable results as noted in HPI ?Labs reviewed and essentially negative except for sodium of 132, glucose of 112 ?Chest x-ray reviewed by me shows No evidence of acute cardiopulmonary abnormality. ?Upper thoracic dextro curvature. Degenerative changes of the spine. ?X-ray of the left hip shows displaced left femoral neck fracture. ?Twelve-lead EKG reviewed by me shows normal sinus rhythm with LVH ?There are no new results to review at this time. ? ?Assessment and Plan: ?* Femoral neck fracture ([place]) ?Status post mechanical fall with a left femoral neck fracture ?Immobilize left lower extremity ?Pain control ?Place patient on muscle relaxants ?Orthopedic surgery consult ?Place patient on fall precautions ? ?Hypertension, benign ?Blood pressure is well controlled ?Continue lisinopril ? ? ? ? ? Advance Care Planning:   Code Status: Full Code  ? ?Consults: Orthopedic surgery ? ?Family Communication: Greater than 50% of time was spent discussing plan of care with patient at the bedside.  All questions and concerns have been addressed.  She verbalizes understanding and agrees with the plan.  CODE STATUS was discussed and she is a full code.  She lists her children as her healthcare power of attorney ? ?Severity of Illness: ?The appropriate patient status for this patient is INPATIENT. Inpatient status is judged to be reasonable and necessary in order to provide the required intensity of service to ensure the patient's safety. The patient's presenting symptoms, physical exam findings, and initial radiographic and laboratory data in the context of their chronic comorbidities is felt to place them at high risk for further clinical deterioration. Furthermore, it is not anticipated that the patient will be medically stable for discharge from the hospital within 2 midnights of admission.  ? ?* I certify that at the  point of admission it is my clinical judgment that the patient will require inpatient hospital care spanning beyond 2 midnights from the point of admission due to high intensity of service, high risk for further deterioration and high frequency of surveillance required.* ? ?Author: ?Collier Bullock, MD ?06/02/2021 1:19 PM ? ?For on call review [email or web address].  ?

## 2021-06-02 NOTE — Assessment & Plan Note (Signed)
Status post mechanical fall with a left femoral neck fracture ?Immobilize left lower extremity ?Pain control ?Place patient on muscle relaxants ?Orthopedic surgery consult ?Place patient on fall precautions ?

## 2021-06-02 NOTE — Consult Note (Signed)
?ORTHOPAEDIC CONSULTATION ? ?REQUESTING PHYSICIAN: Collier Bullock, MD ? ?Chief Complaint: left hip pain ? ?HPI: ?Sandra [name] is a 81 y.o. female who complains of left hip pain after fall. The pain is sharp in character. The pain is severe and 10/10. The pain is worse with movement and better with rest. Denies any numbness, tingling or constitutional symptoms. ? ?Past Medical History:  ?Diagnosis Date  ? Anxiety   ? Bell's palsy   ? Changes in skin texture   ? Hair loss   ? Hiatal hernia   ? Hypertension   ? Murmur, cardiac   ? Nocturia   ? Ovarian failure   ? ?Past Surgical History:  ?Procedure Laterality Date  ? ABDOMINAL HYSTERECTOMY    ? HIP SURGERY  01/22/2019  ? INTRAMEDULLARY (IM) NAIL INTERTROCHANTERIC Right 08/19/2018  ? Procedure: INTRAMEDULLARY (IM) NAIL INTERTROCHANTRIC;  Surgeon: Nida Boatman, MD;  Location: [facility] ORS;  Service: Orthopedics;  Laterality: Right;  ? MULTIPLE TOOTH EXTRACTIONS N/A   ? patient had all of her bottom teeth pulled about 2-3 weeks ago.  ? TONSILLECTOMY    ? ?Social History  ? ?Socioeconomic History  ? Marital status: Widowed  ?  Spouse name: Not on file  ? Number of children: 2  ? Years of education: 44  ? Highest education level: High school graduate  ?Occupational History  ? Not on file  ?Tobacco Use  ? Smoking status: Never  ? Smokeless tobacco: Never  ?Vaping Use  ? Vaping Use: Never used  ?Substance and Sexual Activity  ? Alcohol use: No  ?  Alcohol/week: 0.0 standard drinks  ? Drug use: No  ? Sexual activity: Not Currently  ?Other Topics Concern  ? Not on file  ?Social History Narrative  ? She lives in an independent living facility - [facility], but will spend winter with her sister in [place]   ?   ? 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   ? ?Social Determinants of Health  ? ?Financial Resource Strain: Low Risk   ? Difficulty of Paying Living Expenses: Not hard at all  ?Food Insecurity: No Food Insecurity  ? Worried About Charity fundraiser in the Last  Year: Never true  ? Ran Out of Food in the Last Year: Never true  ?Transportation Needs: No Transportation Needs  ? Lack of Transportation (Medical): No  ? Lack of Transportation (Non-Medical): No  ?Physical Activity: Inactive  ? Days of Exercise per Week: 0 days  ? Minutes of Exercise per Session: 0 min  ?Stress: No Stress Concern Present  ? Feeling of Stress : Not at all  ?Social Connections: Moderately Isolated  ? Frequency of Communication with Friends and Family: More than three times a week  ? Frequency of Social Gatherings with Friends and Family: Twice a week  ? Attends Religious Services: More than 4 times per year  ? Active Member of Clubs or Organizations: No  ? Attends Archivist Meetings: Never  ? Marital Status: Widowed  ? ?Family History  ?Problem Relation Age of Onset  ? Hypertension Daughter   ? Hypertension Sister   ? Hypertension Brother   ? ?No Known Allergies ?Prior to Admission medications   ?Medication Sig Start Date End Date Taking? Authorizing Provider  ?Cholecalciferol (VITAMIN D3) 50 MCG (2000 UT) capsule Take 1 capsule (2,000 Units total) by mouth daily. 04/23/20   Steele Sizer, MD  ?lisinopril (ZESTRIL) 2.5 MG tablet Take 1 tablet (2.5 mg total)  by mouth daily. 06/17/20   Steele Sizer, MD  ?Multiple Vitamin (MULTIVITAMIN) tablet Take 1 tablet by mouth daily. 04/23/20   Steele Sizer, MD  ? ?DG Chest 1 View ? ?Result Date: 06/02/2021 ?CLINICAL DATA:  Provided history: Fall with left hip pain. Mechanical fall with left hip pain. EXAM: CHEST  1 VIEW COMPARISON:  Prior chest radiographs 08/19/2018 and earlier. FINDINGS: Heart size within normal limits. No appreciable airspace consolidation. No evidence of pleural effusion or pneumothorax. No acute bony abnormality identified. Upper thoracic dextrocurvature. Degenerative changes of the spine. IMPRESSION: No evidence of acute cardiopulmonary abnormality. Upper thoracic dextrocurvature Degenerative changes of the spine.  Electronically Signed   By: Kellie Simmering D.O.   On: 06/02/2021 11:55  ? ?DG Hip Unilat With Pelvis 2-3 Views Left ? ?Result Date: 06/02/2021 ?CLINICAL DATA:  Fall.  Left hip pain. EXAM: DG HIP (WITH OR WITHOUT PELVIS) 2-3V LEFT COMPARISON:  None Available. FINDINGS: Displaced left femoral neck fracture is seen. No evidence of hip dislocation. No acute pelvic fracture identified. Internal fixation hardware noted in the right hip. Generalized osteopenia also demonstrated. IMPRESSION: Displaced left femoral neck fracture. Electronically Signed   By: Marlaine Hind M.D.   On: 06/02/2021 11:56   ? ?Positive ROS: All other systems have been reviewed and were otherwise negative with the exception of those mentioned in the HPI and as above. ? ?Physical Exam: ?General: Alert, no acute distress ?Cardiovascular: No pedal edema ?Respiratory: No cyanosis, no use of accessory musculature ?GI: No organomegaly, abdomen is soft and non-tender ?Skin: No lesions in the area of chief complaint ?Neurologic: Sensation intact distally ?Psychiatric: Patient is competent for consent with normal mood and affect ?Lymphatic: No axillary or cervical lymphadenopathy ? ?MUSCULOSKELETAL: left leg short, externally rotated. Compartments soft. Good cap refill. Motor and sensory intact distally. ? ?Assessment: ?Left femoral neck fracture, closed, displaced ? ?Plan: ?Plan a left hip hemiarthroplasty tomorrow afternoon. NPO after midnight. ? ?The diagnosis, risks, benefits and alternatives to treatment are all discussed in detail with the patient and family. Risks include but are not limited to bleeding, infection, deep vein thrombosis, pulmonary embolism, nerve or vascular injury, non-union, repeat operation, persistent pain, weakness, stiffness and death. She understands and is eager to proceed. ? ? ? ? ?Lovell Sheehan, MD ? ? ? ?06/02/2021 ?12:43 PM ? ?  ?

## 2021-06-02 NOTE — Assessment & Plan Note (Signed)
Blood pressure is well controlled.  Continue lisinopril. °

## 2021-06-02 NOTE — ED Triage Notes (Signed)
Patient to ER via [facility] from [facility] living. Patient here after a witnessed, ground level, mechanical fall. Reports falling down one step. Reports left hip pain- shortening and external rotation present. Did not hit head or lose consciousness. Denies blood thinner usage. Reports her family were picking her up to take her to her eye doctor appointment.  ? ?VSS- BP 145/68, HR 77, 98% RA.  ?

## 2021-06-03 ENCOUNTER — Encounter
Admission: EM | Disposition: A | Discharge status: Skilled Nursing Facility | Payer: Self-pay | Source: Home / Self Care | Attending: Internal Medicine

## 2021-06-03 ENCOUNTER — Inpatient Hospital Stay: Payer: Medicare PPO

## 2021-06-03 ENCOUNTER — Inpatient Hospital Stay: Payer: Medicare PPO | Admitting: Certified Registered Nurse Anesthetist

## 2021-06-03 ENCOUNTER — Encounter: Payer: Self-pay | Admitting: Internal Medicine

## 2021-06-03 DIAGNOSIS — E871 Hypo-osmolality and hyponatremia: Secondary | ICD-10-CM

## 2021-06-03 DIAGNOSIS — I10 Essential (primary) hypertension: Secondary | ICD-10-CM | POA: Diagnosis not present

## 2021-06-03 DIAGNOSIS — S72002K Fracture of unspecified part of neck of left femur, subsequent encounter for closed fracture with nonunion: Secondary | ICD-10-CM | POA: Diagnosis not present

## 2021-06-03 HISTORY — PX: HIP ARTHROPLASTY: SHX981

## 2021-06-03 LAB — CBC
HCT: 33.8 % — ABNORMAL LOW (ref 36.0–46.0)
Hemoglobin: 11.6 g/dL — ABNORMAL LOW (ref 12.0–15.0)
MCH: 31 pg (ref 26.0–34.0)
MCHC: 34.3 g/dL (ref 30.0–36.0)
MCV: 90.4 fL (ref 80.0–100.0)
Platelets: 186 10*3/uL (ref 150–400)
RBC: 3.74 MIL/uL — ABNORMAL LOW (ref 3.87–5.11)
RDW: 12.4 % (ref 11.5–15.5)
WBC: 8.5 10*3/uL (ref 4.0–10.5)
nRBC: 0 % (ref 0.0–0.2)

## 2021-06-03 LAB — BASIC METABOLIC PANEL
Anion gap: 6 (ref 5–15)
BUN: 22 mg/dL (ref 8–23)
CO2: 26 mmol/L (ref 22–32)
Calcium: 9.1 mg/dL (ref 8.9–10.3)
Chloride: 98 mmol/L (ref 98–111)
Creatinine, Ser: 0.87 mg/dL (ref 0.44–1.00)
GFR, Estimated: >60 mL/min (ref >60)
Glucose, Bld: 115 mg/dL — ABNORMAL HIGH (ref 70–99)
Potassium: 4 mmol/L (ref 3.5–5.1)
Sodium: 130 mmol/L — ABNORMAL LOW (ref 135–145)

## 2021-06-03 SURGERY — HEMIARTHROPLASTY, HIP, DIRECT ANTERIOR APPROACH, FOR FRACTURE
Anesthesia: Spinal | Site: Hip | Laterality: Left

## 2021-06-03 MED ORDER — DEXAMETHASONE SODIUM PHOSPHATE 10 MG/ML IJ SOLN
INTRAMUSCULAR | Status: DC | PRN
  Administered 2021-06-03: 10 mg via INTRAVENOUS

## 2021-06-03 MED ORDER — FENTANYL CITRATE (PF) 100 MCG/2ML IJ SOLN
INTRAMUSCULAR | Status: DC | PRN
  Administered 2021-06-03: 50 ug via INTRAVENOUS

## 2021-06-03 MED ORDER — CHLORHEXIDINE GLUCONATE CLOTH 2 % EX PADS: 6.0000 | MEDICATED_PAD | Freq: Every day | CUTANEOUS | Status: DC

## 2021-06-03 MED ORDER — PHENYLEPHRINE HCL-NACL 20-0.9 MG/250ML-% IV SOLN
INTRAVENOUS | Status: DC | PRN
  Administered 2021-06-03: 40 ug/min via INTRAVENOUS

## 2021-06-03 MED ORDER — LACTATED RINGERS IV SOLN: INTRAVENOUS | Status: DC

## 2021-06-03 MED ORDER — OXYCODONE HCL 5 MG/5ML PO SOLN: 5.0000 mg | Freq: Once | ORAL | Status: DC | PRN

## 2021-06-03 MED ORDER — OXYCODONE HCL 5 MG PO TABS: 5.0000 mg | ORAL_TABLET | Freq: Once | ORAL | Status: DC | PRN

## 2021-06-03 MED ORDER — PROPOFOL 500 MG/50ML IV EMUL
INTRAVENOUS | Status: DC | PRN
Start: 2021-06-03 — End: 2021-06-03
  Administered 2021-06-03: 50 ug/kg/min via INTRAVENOUS

## 2021-06-03 MED ORDER — MENTHOL 3 MG MT LOZG: 1.0000 | LOZENGE | OROMUCOSAL | Status: DC | PRN

## 2021-06-03 MED ORDER — 0.9 % SODIUM CHLORIDE (POUR BTL) OPTIME
TOPICAL | Status: DC | PRN
Start: 2021-06-03 — End: 2021-06-03
  Administered 2021-06-03: 1000 mL

## 2021-06-03 MED ORDER — ACETAMINOPHEN 10 MG/ML IV SOLN
INTRAVENOUS | Status: AC
  Filled 2021-06-03: qty 100

## 2021-06-03 MED ORDER — PROPOFOL 1000 MG/100ML IV EMUL
INTRAVENOUS | Status: AC
  Filled 2021-06-03: qty 100

## 2021-06-03 MED ORDER — DOCUSATE SODIUM 100 MG PO CAPS
100.0000 mg | ORAL_CAPSULE | Freq: Two times a day (BID) | ORAL | Status: DC
  Administered 2021-06-03 – 2021-06-06 (×7): 100 mg via ORAL
  Filled 2021-06-03 (×8): qty 1

## 2021-06-03 MED ORDER — PHENOL 1.4 % MT LIQD: 1.0000 | OROMUCOSAL | Status: DC | PRN

## 2021-06-03 MED ORDER — BUPIVACAINE-EPINEPHRINE (PF) 0.25% -1:200000 IJ SOLN
INTRAMUSCULAR | Status: DC | PRN
  Administered 2021-06-03: 30 mL

## 2021-06-03 MED ORDER — ALUM & MAG HYDROXIDE-SIMETH 200-200-20 MG/5ML PO SUSP: 30.0000 mL | ORAL | Status: DC | PRN

## 2021-06-03 MED ORDER — CEFAZOLIN SODIUM-DEXTROSE 1-4 GM/50ML-% IV SOLN
1.0000 g | Freq: Four times a day (QID) | INTRAVENOUS | Status: AC
  Administered 2021-06-03 – 2021-06-04 (×2): 1 g via INTRAVENOUS
  Filled 2021-06-03 (×3): qty 50

## 2021-06-03 MED ORDER — NEOMYCIN-POLYMYXIN B GU 40-200000 IR SOLN
Status: DC | PRN
  Administered 2021-06-03: 16 mL

## 2021-06-03 MED ORDER — ENSURE ENLIVE PO LIQD
237.0000 mL | Freq: Two times a day (BID) | ORAL | Status: DC
  Administered 2021-06-04 – 2021-06-07 (×6): 237 mL via ORAL
  Filled 2021-06-03: qty 237

## 2021-06-03 MED ORDER — PROPOFOL 10 MG/ML IV BOLUS
INTRAVENOUS | Status: DC | PRN
  Administered 2021-06-03: 20 mg via INTRAVENOUS
  Administered 2021-06-03: 40 mg via INTRAVENOUS
  Administered 2021-06-03: 30 mg via INTRAVENOUS

## 2021-06-03 MED ORDER — BUPIVACAINE-EPINEPHRINE (PF) 0.25% -1:200000 IJ SOLN
INTRAMUSCULAR | Status: AC
  Filled 2021-06-03: qty 30

## 2021-06-03 MED ORDER — ONDANSETRON HCL 4 MG/2ML IJ SOLN
INTRAMUSCULAR | Status: DC | PRN
  Administered 2021-06-03: 4 mg via INTRAVENOUS

## 2021-06-03 MED ORDER — METOCLOPRAMIDE HCL 5 MG/ML IJ SOLN: 5.0000 mg | Freq: Three times a day (TID) | INTRAMUSCULAR | Status: DC | PRN

## 2021-06-03 MED ORDER — DEXMEDETOMIDINE (PRECEDEX) IN NS 20 MCG/5ML (4 MCG/ML) IV SYRINGE
PREFILLED_SYRINGE | INTRAVENOUS | Status: DC | PRN
  Administered 2021-06-03 (×2): 8 ug via INTRAVENOUS

## 2021-06-03 MED ORDER — ONDANSETRON HCL 4 MG/2ML IJ SOLN: 4.0000 mg | Freq: Four times a day (QID) | INTRAMUSCULAR | Status: DC | PRN

## 2021-06-03 MED ORDER — ONDANSETRON HCL 4 MG PO TABS
4.0000 mg | ORAL_TABLET | Freq: Four times a day (QID) | ORAL | Status: DC | PRN
  Filled 2021-06-03: qty 1

## 2021-06-03 MED ORDER — ROCURONIUM BROMIDE 100 MG/10ML IV SOLN
INTRAVENOUS | Status: DC | PRN
  Administered 2021-06-03: 50 mg via INTRAVENOUS

## 2021-06-03 MED ORDER — SUGAMMADEX SODIUM 200 MG/2ML IV SOLN
INTRAVENOUS | Status: DC | PRN
  Administered 2021-06-03: 124.2 mg via INTRAVENOUS

## 2021-06-03 MED ORDER — BISACODYL 10 MG RE SUPP: 10.0000 mg | Freq: Every day | RECTAL | Status: DC | PRN

## 2021-06-03 MED ORDER — FENTANYL CITRATE (PF) 100 MCG/2ML IJ SOLN
INTRAMUSCULAR | Status: AC
  Filled 2021-06-03: qty 2

## 2021-06-03 MED ORDER — CEFAZOLIN SODIUM-DEXTROSE 2-4 GM/100ML-% IV SOLN
INTRAVENOUS | Status: AC
  Filled 2021-06-03: qty 100

## 2021-06-03 MED ORDER — PROMETHAZINE HCL 25 MG/ML IJ SOLN: 6.2500 mg | INTRAMUSCULAR | Status: DC | PRN

## 2021-06-03 MED ORDER — ACETAMINOPHEN 10 MG/ML IV SOLN
1000.0000 mg | Freq: Once | INTRAVENOUS | Status: DC | PRN
  Administered 2021-06-03: 1000 mg via INTRAVENOUS

## 2021-06-03 MED ORDER — PHENYLEPHRINE 80 MCG/ML (10ML) SYRINGE FOR IV PUSH (FOR BLOOD PRESSURE SUPPORT)
PREFILLED_SYRINGE | INTRAVENOUS | Status: DC | PRN
  Administered 2021-06-03 (×5): 80 ug via INTRAVENOUS

## 2021-06-03 MED ORDER — METOCLOPRAMIDE HCL 5 MG PO TABS
5.0000 mg | ORAL_TABLET | Freq: Three times a day (TID) | ORAL | Status: DC | PRN
  Filled 2021-06-03: qty 2

## 2021-06-03 MED ORDER — ASPIRIN 81 MG PO CHEW
81.0000 mg | CHEWABLE_TABLET | Freq: Two times a day (BID) | ORAL | Status: DC
  Administered 2021-06-03 – 2021-06-07 (×8): 81 mg via ORAL
  Filled 2021-06-03 (×8): qty 1

## 2021-06-03 MED ORDER — SODIUM CHLORIDE 0.9 % IR SOLN
Status: DC | PRN
  Administered 2021-06-03: 3000 mL

## 2021-06-03 MED ORDER — MAGNESIUM HYDROXIDE 400 MG/5ML PO SUSP
30.0000 mL | Freq: Every day | ORAL | Status: DC | PRN
  Administered 2021-06-04: 30 mL via ORAL
  Filled 2021-06-03: qty 30

## 2021-06-03 MED ORDER — LACTATED RINGERS IV SOLN: INTRAVENOUS | Status: DC | PRN

## 2021-06-03 MED ORDER — FENTANYL CITRATE (PF) 100 MCG/2ML IJ SOLN
25.0000 ug | INTRAMUSCULAR | Status: DC | PRN
  Administered 2021-06-03: 25 ug via INTRAVENOUS

## 2021-06-03 SURGICAL SUPPLY — 57 items
BLADE SAGITTAL WIDE XTHICK NO (BLADE) ×2 IMPLANT
BNDG COHESIVE 4X5 TAN ST LF (GAUZE/BANDAGES/DRESSINGS) ×4 IMPLANT
BNDG COHESIVE 6X5 TAN ST LF (GAUZE/BANDAGES/DRESSINGS) ×2 IMPLANT
BRUSH SCRUB EZ  4% CHG (MISCELLANEOUS) ×2
BRUSH SCRUB EZ 4% CHG (MISCELLANEOUS) ×2 IMPLANT
CEMENT BONE 40GM (Cement) ×2 IMPLANT
CENTRALIZER STEM DISTAL 12 (Knees) ×1 IMPLANT
CHLORAPREP W/TINT 26 (MISCELLANEOUS) ×2 IMPLANT
DRAPE 3/4 80X56 (DRAPES) ×4 IMPLANT
DRAPE C-ARM 42X72 X-RAY (DRAPES) ×2 IMPLANT
DRAPE STERI IOBAN 125X83 (DRAPES) ×2 IMPLANT
DRAPE SURG 17X11 SM STRL (DRAPES) ×2 IMPLANT
DRAPE U-SHAPE 47X51 STRL (DRAPES) ×2 IMPLANT
DRSG AQUACEL AG ADV 3.5X10 (GAUZE/BANDAGES/DRESSINGS) ×2 IMPLANT
DRSG AQUACEL AG ADV 3.5X14 (GAUZE/BANDAGES/DRESSINGS) ×2 IMPLANT
ELECT REM PT RETURN 9FT ADLT (ELECTROSURGICAL) ×2
ELECTRODE REM PT RTRN 9FT ADLT (ELECTROSURGICAL) ×1 IMPLANT
GAUZE 4X4 16PLY ~~LOC~~+RFID DBL (SPONGE) ×2 IMPLANT
GAUZE XEROFORM 1X8 LF (GAUZE/BANDAGES/DRESSINGS) ×2 IMPLANT
GLOVE SURG ORTHO LTX SZ8.5 (GLOVE) ×2 IMPLANT
GLOVE SURG UNDER POLY LF SZ8.5 (GLOVE) ×2 IMPLANT
GOWN STRL REUS W/ TWL LRG LVL3 (GOWN DISPOSABLE) ×2 IMPLANT
GOWN STRL REUS W/ TWL XL LVL3 (GOWN DISPOSABLE) ×1 IMPLANT
GOWN STRL REUS W/TWL LRG LVL3 (GOWN DISPOSABLE) ×2
GOWN STRL REUS W/TWL XL LVL3 (GOWN DISPOSABLE) ×1
HEAD 28MM 0 (Hips) ×1 IMPLANT
HOLSTER ELECTROSUGICAL PENCIL (MISCELLANEOUS) ×2 IMPLANT
IV NS 1000ML (IV SOLUTION) ×1
IV NS 1000ML BAXH (IV SOLUTION) ×1 IMPLANT
KIT PREP HIP W/CEMENT RESTRICT (Miscellaneous) IMPLANT
KIT PREPARATION TOTAL HIP (Miscellaneous) ×1 IMPLANT
KIT TURNOVER CYSTO (KITS) ×2 IMPLANT
LINER BIPOLAR 28MM (Liner) ×1 IMPLANT
MANIFOLD NEPTUNE II (INSTRUMENTS) ×2 IMPLANT
MAT ABSORB  FLUID 56X50 GRAY (MISCELLANEOUS) ×1
MAT ABSORB FLUID 56X50 GRAY (MISCELLANEOUS) ×1 IMPLANT
NDL SAFETY ECLIPSE 18X1.5 (NEEDLE) ×2 IMPLANT
NDL SPNL 20GX3.5 QUINCKE YW (NEEDLE) ×1 IMPLANT
NEEDLE HYPO 18GX1.5 SHARP (NEEDLE) ×2
NEEDLE HYPO 22GX1.5 SAFETY (NEEDLE) ×2 IMPLANT
NEEDLE SPNL 20GX3.5 QUINCKE YW (NEEDLE) ×2 IMPLANT
PACK HIP PROSTHESIS (MISCELLANEOUS) ×3 IMPLANT
PADDING CAST BLEND 4X4 NS (MISCELLANEOUS) ×4 IMPLANT
PILLOW ABDUCTION MEDIUM (MISCELLANEOUS) ×2 IMPLANT
PULSAVAC PLUS IRRIG FAN TIP (DISPOSABLE) ×2
SPONGE T-LAP 18X18 ~~LOC~~+RFID (SPONGE) ×8 IMPLANT
STAPLER SKIN PROX 35W (STAPLE) ×2 IMPLANT
STEM FEMORAL SZ12 SYNERGY (Stem) ×1 IMPLANT
SUT BONE WAX W31G (SUTURE) ×2 IMPLANT
SUT DVC 2 QUILL PDO  T11 36X36 (SUTURE) ×1
SUT DVC 2 QUILL PDO T11 36X36 (SUTURE) ×1 IMPLANT
SUT MERSILENE 5MM BP 1 12 (SUTURE) ×1 IMPLANT
SUT VIC AB 2-0 CT1 18 (SUTURE) ×2 IMPLANT
SYR 20ML LL LF (SYRINGE) ×2 IMPLANT
TIP FAN IRRIG PULSAVAC PLUS (DISPOSABLE) ×1 IMPLANT
TOWER CARTRIDGE SMART MIX (DISPOSABLE) ×1 IMPLANT
WATER STERILE IRR 500ML POUR (IV SOLUTION) ×2 IMPLANT

## 2021-06-03 NOTE — Anesthesia Preprocedure Evaluation (Addendum)
Anesthesia Evaluation  Patient identified by MRN, date of birth, ID band Patient awake    Reviewed: Allergy & Precautions, H&P , NPO status , Patient's Chart, lab work & pertinent test results, reviewed documented beta blocker date and time   History of Anesthesia Complications Negative for: history of anesthetic complications  Airway Mallampati: II  TM Distance: >3 FB Neck ROM: full    Dental  (+) Dental Advidsory Given, Edentulous Upper, Edentulous Lower   Pulmonary neg pulmonary ROS,    Pulmonary exam normal        Cardiovascular Exercise Tolerance: Good hypertension, (-) angina(-) CAD and (-) Past MI Normal cardiovascular exam(-) dysrhythmias + Valvular Problems/Murmurs      Neuro/Psych neg Seizures PSYCHIATRIC DISORDERS Anxiety Bell's palsy- pt reports being evaluated by ophthalmologist last week for right eye dysfunction and right eyelid closure.   Neuromuscular disease (Bell's Palsy)    GI/Hepatic Neg liver ROS, hiatal hernia, GERD  Medicated,  Endo/Other  Hyponatremia   Renal/GU negative Renal ROS  negative genitourinary   Musculoskeletal   Abdominal Normal abdominal exam  (+)   Peds  Hematology negative hematology ROS (+)   Anesthesia Other Findings S/p witnessed mechanical with left hip fx  Past Medical History: No date: Anxiety No date: Bell's palsy No date: Changes in skin texture No date: Hair loss No date: Hiatal hernia No date: Hypertension No date: Murmur, cardiac No date: Nocturia No date: Ovarian failure   Reproductive/Obstetrics negative OB ROS                           Anesthesia Physical  Anesthesia Plan  ASA: II  Anesthesia Plan: Spinal   Post-op Pain Management:    Induction: Intravenous  PONV Risk Score and Plan: 2 and Propofol infusion and TIVA  Airway Management Planned: Natural Airway and Simple Face Mask  Additional Equipment:   Intra-op  Plan:   Post-operative Plan:   Informed Consent: I have reviewed the patients History and Physical, chart, labs and discussed the procedure including the risks, benefits and alternatives for the proposed anesthesia with the patient or authorized representative who has indicated his/her understanding and acceptance.     Dental Advisory Given  Plan Discussed with: Anesthesiologist, CRNA and Surgeon  Anesthesia Plan Comments:        Anesthesia Quick Evaluation

## 2021-06-03 NOTE — Progress Notes (Signed)
MRA is a test that looks for aneurysms.  It is the best test to look for aneurysms much better than a regular MRI.  One of the main causes of acute 3rd nerve palsy is an intracranial aneurysm.  This is why the MRA was ordered.  Unfortunately have no way of knowing who ordered the MRA because I cannot get into the orders from the eighth of this month.  I did not actually see the patient however.  Whoever did order the MR a did exactly the right thing.  In fact I believe the MRA was requested by the referring ophthalmologist.

## 2021-06-03 NOTE — Progress Notes (Signed)
  Progress Note   Patient: Sandra [name] [id number] DOB: 1940-02-14 DOA: 06/02/2021     1 DOS: the patient was seen and examined on 06/03/2021   Brief hospital course: Sandra [name] is a 81 y.o. female with medical history significant for hypertension, anxiety disorder, hiatal hernia who presents to the ER via EMS from an assisted living facility after she had a witnessed ground-level mechanical fall.  He sustained a left femoral neck fracture, surgery scheduled for today.    Assessment and Plan: Neck fracture on the left. Patient will be having surgery today.  We will follow closely.  Chronic hyponatremia. 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 Reviewed previous lab, patient had hyponatremia a year ago, sodium level still stable.  Essential hypertension. On lisinopril.     Subjective:  Patient has not been complaining of hip pain, otherwise doing well.  Denies any chest pain shortness of breath.  Physical Exam: Vitals:   06/03/21 0420 06/03/21 0939 06/03/21 1146 06/03/21 1210  BP: (!) 150/68 (!) 143/73 (!) 179/80 (!) 151/65  Pulse: 75 71 77 75  Resp: 17 18 19    Temp: 98.3 F (36.8 C) 99.2 F (37.3 C)    TempSrc: Oral     SpO2: 100% 100% 99%   Weight:      Height:       General exam: Appears calm and comfortable  Respiratory system: Clear to auscultation. Respiratory effort normal. Cardiovascular system: S1 & S2 heard, RRR. No JVD, murmurs, rubs, gallops or clicks. No pedal edema. Gastrointestinal system: Abdomen is nondistended, soft and nontender. No organomegaly or masses felt. Normal bowel sounds heard. Central nervous system: Alert and oriented. No focal neurological deficits. Extremities: Symmetric 5 x 5 power. Skin: No rashes, lesions or ulcers Psychiatry: Judgement and insight appear normal. Mood & affect appropriate.   Data Reviewed:  X-ray reviewed, labs reviewed.  Family Communication:   Disposition: Status is: Inpatient Remains inpatient appropriate because:  Severity of disease, pending procedure  Planned Discharge Destination:  pending    Time spent: 28 minutes  Author: Sharen Hones, MD 06/03/2021 12:48 PM  For on call review [email or web address].

## 2021-06-03 NOTE — Anesthesia Procedure Notes (Addendum)
Spinal  Patient location during procedure: OR Start time: 06/03/2021 4:10 PM Reason for block: surgical anesthesia Staffing Performed: resident/CRNA  Anesthesiologist: Molli Barrows, MD Resident/CRNA: Aline Brochure, CRNA Preanesthetic Checklist Completed: patient identified, IV checked, site marked, risks and benefits discussed, surgical consent, monitors and equipment checked, pre-op evaluation and timeout performed Spinal Block Patient position: sitting Prep: ChloraPrep Patient monitoring: heart rate, continuous pulse ox, blood pressure and cardiac monitor Approach: midline Location: L3-4 Injection technique: single-shot Needle Needle type: Whitacre and Introducer  Needle gauge: 22 G Needle length: 9 cm Assessment Sensory level: T10 Events: CSF return Additional Notes Sterile aseptic technique used throughout the procedure.  . Expiration date of kit checked and confirmed. Patient tolerated procedure well, without complications. After multiple attempts, attempt at spinal placement aborted

## 2021-06-03 NOTE — Anesthesia Procedure Notes (Signed)
Procedure Name: Intubation Date/Time: 06/03/2021 4:36 PM Performed by: Aline Brochure, CRNA Pre-anesthesia Checklist: Patient identified, Patient being monitored, Timeout performed, Emergency Drugs available and Suction available Patient Re-evaluated:Patient Re-evaluated prior to induction Oxygen Delivery Method: Circle system utilized Preoxygenation: Pre-oxygenation with 100% oxygen Induction Type: IV induction Ventilation: Mask ventilation without difficulty Laryngoscope Size: Mac, 3 and McGraph Grade View: Grade I Tube type: Oral Tube size: 7.0 mm Number of attempts: 1 Airway Equipment and Method: Stylet Placement Confirmation: ETT inserted through vocal cords under direct vision, positive ETCO2 and breath sounds checked- equal and bilateral Secured at: 21 cm Tube secured with: Tape Dental Injury: Teeth and Oropharynx as per pre-operative assessment

## 2021-06-03 NOTE — Progress Notes (Signed)
Patient awake/alert x4. Patient able to move bil feet, side to side, sensation intact. Abduction pillow in place. Patient with h/o Bell's Palsey right side, eye unable to stay open per patient. Indwelling foley in place: patent. Afebrile vitals at baseline for patient. Pain under control. Patient verbalizes understanding that procedure complete .

## 2021-06-03 NOTE — Progress Notes (Signed)
Received from PACU nurse, Awake, oriented x4, No pain as verbalized, Indwelling foley catheter in place, able to move bilateral feet.  Called Daughter, Pam as requested and endorsed by previous RN and wasn't able to answer, left a voicemail that patient is already back to room 149.

## 2021-06-03 NOTE — Op Note (Signed)
06/03/2021  6:22 PM  PATIENT:  Sandra [name]   MRN: [id number]  PRE-OPERATIVE DIAGNOSIS:  Displaced Subcapital fracture left hip   POST-OPERATIVE DIAGNOSIS: Same  PROCEDURE:  Left hip hemiarthroplasty cemented  PREOPERATIVE INDICATIONS:  Sandra [name] is an 81 y.o. female who was admitted 06/02/2021 with a diagnosis of displaced subcapital fracture of the hip and elected for surgical management.  The risks benefits and alternatives were discussed with the patient including but not limited to the risks of nonoperative treatment, versus surgical intervention including infection, bleeding, nerve injury, periprosthetic fracture, the need for revision surgery, dislocation, leg length discrepancy, blood clots, cardiopulmonary complications, morbidity, mortality, among others, and they were willing to proceed.  Predicted outcome is good, although there will be at least a six to nine month expected recovery.     SURGEON:  Kurtis Bushman, MD  ASST: Carlynn Spry, PA-C     ANESTHESIA: General and local    COMPLICATIONS:  None.   EBL:  200 cc    COMPONENTS:   Synergy cemented stem size 12  ,   and a bipolar head size 43 mm and a 28 mm by  +0 mm  femoral head    PROCEDURE IN DETAIL: The patient was met in the holding area and identified.  The appropriate hip  was marked at the operative site. The patient was then transported to the OR and  placed under general anesthesia.  At that point, the patient was  placed in the lateral decubitus position with the operative side up and  secured to the operating room table and all bony prominences padded.     The operative lower extremity was prepped from the iliac crest to the toes.  Sterile draping was performed.  Time out was performed prior to incision.      A routine posterolateral approach was utilized via sharp dissection  carried down to the subcutaneous tissue.  Gross bleeders were Bovie  coagulated.  The iliotibial band was identified and  incised  along the length of the skin incision.  Self-retaining retractors were  inserted.  With the hip internally rotated, the short external rotators  were identified. The piriformis was tagged and the hip capsule released in a T-type fashion.  The femoral neck was exposed, and I resected the femoral neck using the appropriate jig. This was performed at approximately a finger breadth above the lesser trochanter.    I then exposed the deep acetabulum, debrided bony and soft tissues including the ligamentum teres.    I then prepared the proximal femur using the cookie-cutter, the lateralizing reamer, and then sequentially reamed and broached.  A trial stem   was  utilized along with a bipolar head and neck.  I reduced the hip and it was found to have excellent stability with functional range of motion. Leg lengths were equal.  The trial components were then removed.   The same size femoral stem was then inserted using 3rd generation cement technique and was allowed to dry.  The head and neck as trialed were inserted as well.     The hip was then reduced and taken through functional range of motion and found to have excellent stability. Leg lengths were restored.     I closed the T in the capsule with 5 mm Mersilene tape as well as the short external rotators.    I then irrigated the hip copiously again with pulse lavage, and repaired the fascia with #2 Quill and the subcutaneous  layer with 2-0 Vicryl and staples for the skin. Sponge and needle counts were correct. Xeroform and sterile Aquacell was applied. An abduction pillow was placed.  The patient was then awakened and returned to PACU in stable and satisfactory condition. There were no complications.  Kurtis Bushman, MD  06/03/2021 6:22 PM

## 2021-06-03 NOTE — Transfer of Care (Signed)
Immediate Anesthesia Transfer of Care Note  Patient: Sandra [name]  Procedure(s) Performed: ARTHROPLASTY BIPOLAR HIP (HEMIARTHROPLASTY) (Left: Hip)  Patient Location: PACU  Anesthesia Type:General  Level of Consciousness: awake and sedated  Airway & Oxygen Therapy: Patient Spontanous Breathing and Patient connected to face mask oxygen  Post-op Assessment: Report given to RN and Post -op Vital signs reviewed and stable  Post vital signs: Reviewed and stable  Last Vitals:  Vitals Value Taken Time  BP 108/58 06/03/21 1823  Temp    Pulse 64 06/03/21 1830  Resp 14 06/03/21 1830  SpO2 100 % 06/03/21 1830  Vitals shown include unvalidated device data.  Last Pain:  Vitals:   06/03/21 1501  TempSrc: Oral  PainSc:       Patients Stated Pain Goal: 3 (06/03/21 0622)  Complications: No notable events documented.

## 2021-06-03 NOTE — Plan of Care (Signed)
°  Problem: Education: °Goal: Knowledge of General Education information will improve °Description: Including pain rating scale, medication(s)/side effects and non-pharmacologic comfort measures °Outcome: Progressing °  °Problem: Health Behavior/Discharge Planning: °Goal: Ability to manage health-related needs will improve °Outcome: Progressing °  °Problem: Nutrition: °Goal: Adequate nutrition will be maintained °Outcome: Progressing °  °Problem: Coping: °Goal: Level of anxiety will decrease °Outcome: Progressing °  °Problem: Elimination: °Goal: Will not experience complications related to urinary retention °Outcome: Progressing °  °Problem: Elimination: °Goal: Will not experience complications related to bowel motility °Outcome: Progressing °  °Problem: Pain Managment: °Goal: General experience of comfort will improve °Outcome: Progressing °  °Problem: Safety: °Goal: Ability to remain free from injury will improve °Outcome: Progressing °  °Problem: Skin Integrity: °Goal: Risk for impaired skin integrity will decrease °Outcome: Progressing °  °

## 2021-06-03 NOTE — Progress Notes (Signed)
Initial Nutrition Assessment  DOCUMENTATION CODES:   Not applicable  INTERVENTION:   -Once diet is advanced, add:   -Ensure Enlive po BID, each supplement provides 350 kcal and 20 grams of protein -MVI with minerals daily  NUTRITION DIAGNOSIS:   Increased nutrient needs related to post-op healing as evidenced by estimated needs.  GOAL:   Patient will meet greater than or equal to 90% of their needs  MONITOR:   PO intake, Supplement acceptance, Diet advancement  REASON FOR ASSESSMENT:   Consult Assessment of nutrition requirement/status, Hip fracture protocol  ASSESSMENT:   Ptwith medical history significant for hypertension, anxiety disorder, hiatal hernia who presents from an assisted living facility after she had a witnessed ground-level mechanical fall.  Pt admitted with lt femoral neck fracture s/p fall.   Reviewed I/O's: -985 ml x 24 hours  UOP: 1.5 L x 24 hours  Per orthopedics notes, plan for lt hip hemiarthroplasty today.   Pt previously on a 2 gram sodium diet. Noted meal completions 50%.    Spoke with pt at bedside, who reports good appetite PTA. She generally consumes 3 meals per day (Breakfast: oatmeal; Lunch: sandwich; Dinner: meat, starch, and vegetable). Pt shares that her daughter often orders food for her and also helps prepare Sunday dinner (tomato biscuits).   Reviewed wt hx; pt wt has been stable over the past year. Pt reports her UBW is around 135#. She estimates she has lost about 30# since being admitted to the hospital. When asked about rationale for sudden weight loss, she reports that she is very anxious about upcoming surgery. RD actively listened to pt concerns and provided comfort and emotional support.   Medications reviewed and include vitamin D3 and senokot.   Labs reviewed: Na: 130.    NUTRITION - FOCUSED PHYSICAL EXAM:  Flowsheet Row Most Recent Value  Orbital Region No depletion  Upper Arm Region No depletion  Thoracic and  Lumbar Region No depletion  Buccal Region No depletion  Temple Region No depletion  Clavicle Bone Region No depletion  Clavicle and Acromion Bone Region No depletion  Scapular Bone Region No depletion  Dorsal Hand No depletion  Patellar Region No depletion  Anterior Thigh Region No depletion  Posterior Calf Region No depletion  Edema (RD Assessment) None  Hair Reviewed  Eyes Reviewed  Mouth Reviewed  Skin Reviewed  Nails Reviewed       Diet Order:   Diet Order             Diet NPO time specified  Diet effective midnight                   EDUCATION NEEDS:   Education needs have been addressed  Skin:  Skin Assessment: Reviewed RN Assessment  Last BM:  06/02/21  Height:   Ht Readings from Last 1 Encounters:  06/03/21 5\' 2"  (1.575 m)    Weight:   Wt Readings from Last 1 Encounters:  06/03/21 62.1 kg    Ideal Body Weight:  50 kg  BMI:  Body mass index is 25.04 kg/m.  Estimated Nutritional Needs:   Kcal:  1650-1850  Protein:  80-95 grams  Fluid:  > 1.6 L    06/05/21, RD, LDN, CDCES Registered Dietitian II Certified Diabetes Care and Education Specialist Please refer to [facility] for RD and/or RD on-call/weekend/after hours pager

## 2021-06-04 ENCOUNTER — Other Ambulatory Visit: Payer: Self-pay

## 2021-06-04 DIAGNOSIS — E871 Hypo-osmolality and hyponatremia: Secondary | ICD-10-CM | POA: Diagnosis not present

## 2021-06-04 DIAGNOSIS — S72002K Fracture of unspecified part of neck of left femur, subsequent encounter for closed fracture with nonunion: Secondary | ICD-10-CM | POA: Diagnosis not present

## 2021-06-04 DIAGNOSIS — I10 Essential (primary) hypertension: Secondary | ICD-10-CM | POA: Diagnosis not present

## 2021-06-04 LAB — BASIC METABOLIC PANEL
Anion gap: 10 (ref 5–15)
BUN: 22 mg/dL (ref 8–23)
CO2: 22 mmol/L (ref 22–32)
Calcium: 8.3 mg/dL — ABNORMAL LOW (ref 8.9–10.3)
Chloride: 96 mmol/L — ABNORMAL LOW (ref 98–111)
Creatinine, Ser: 0.94 mg/dL (ref 0.44–1.00)
GFR, Estimated: >60 mL/min (ref >60)
Glucose, Bld: 141 mg/dL — ABNORMAL HIGH (ref 70–99)
Potassium: 3.9 mmol/L (ref 3.5–5.1)
Sodium: 128 mmol/L — ABNORMAL LOW (ref 135–145)

## 2021-06-04 LAB — MAGNESIUM: Magnesium: 2 mg/dL (ref 1.7–2.4)

## 2021-06-04 LAB — CBC
HCT: 31.5 % — ABNORMAL LOW (ref 36.0–46.0)
Hemoglobin: 11.2 g/dL — ABNORMAL LOW (ref 12.0–15.0)
MCH: 32.1 pg (ref 26.0–34.0)
MCHC: 35.6 g/dL (ref 30.0–36.0)
MCV: 90.3 fL (ref 80.0–100.0)
Platelets: 157 10*3/uL (ref 150–400)
RBC: 3.49 MIL/uL — ABNORMAL LOW (ref 3.87–5.11)
RDW: 12.4 % (ref 11.5–15.5)
WBC: 12.8 10*3/uL — ABNORMAL HIGH (ref 4.0–10.5)
nRBC: 0 % (ref 0.0–0.2)

## 2021-06-04 MED ORDER — SODIUM CHLORIDE 0.9 % IV SOLN: INTRAVENOUS | Status: DC

## 2021-06-04 MED ORDER — SODIUM CHLORIDE 1 G PO TABS
1.0000 g | ORAL_TABLET | Freq: Two times a day (BID) | ORAL | Status: DC
  Administered 2021-06-04 – 2021-06-07 (×7): 1 g via ORAL
  Filled 2021-06-04 (×8): qty 1

## 2021-06-04 NOTE — Progress Notes (Signed)
  Progress Note   Patient: Sandra [name] WNI:[id number] DOB: 28-Jan-1940 DOA: 06/02/2021     2 DOS: the patient was seen and examined on 06/04/2021   Brief hospital course: [place] P Moor is a 81 y.o. female with medical history significant for hypertension, anxiety disorder, hiatal hernia who presents to the ER via EMS from an assisted living facility after she had a witnessed ground-level mechanical fall.  He sustained a left femoral neck fracture, left hip hemiarthroplasty performed on 5/18. Assessment and Plan: Left femoral neck fracture status post surgery. Patient still has a poor appetite, inadequate p.o. intake. We will continue IV fluids, but changed to normal saline due to hyponatremia. Patient has been seen by PT/OT, recommended nursing home placement.  Hyponatremia secondary to SIADH. Continue salt tablets.  Recheck BMP tomorrow.  Essential hypertension. Continue lisinopril.     Subjective:  Patient doing well today, she was able to walk a few steps with physical therapy.  Appetite is still poor, not eating sufficient  Physical Exam: Vitals:   06/03/21 2322 06/04/21 0301 06/04/21 0737 06/04/21 1133  BP: 131/62 (!) 147/68 (!) 138/57 126/70  Pulse: 71 75 79 98  Resp: 14 15 19 16   Temp: 98.2 F (36.8 C) 98.4 F (36.9 C) 99 F (37.2 C) 97.9 F (36.6 C)  TempSrc:    Oral  SpO2: 100% 98% 98% 100%  Weight:      Height:       General exam: Appears calm and comfortable  Respiratory system: Clear to auscultation. Respiratory effort normal. Cardiovascular system: S1 & S2 heard, RRR. No JVD, murmurs, rubs, gallops or clicks. No pedal edema. Gastrointestinal system: Abdomen is nondistended, soft and nontender. No organomegaly or masses felt. Normal bowel sounds heard. Central nervous system: Alert and oriented. No focal neurological deficits. Extremities: Symmetric 5 x 5 power. Skin: No rashes, lesions or ulcers Psychiatry: Judgement and insight appear normal. Mood &  affect appropriate.   Data Reviewed:  Lab results reviewed  Family Communication: daughter updated over the phone  Disposition: Status is: Inpatient Remains inpatient appropriate because: severity of disease  Planned Discharge Destination: Skilled nursing facility    Time spent: 28 minutes  Author: , MD 06/04/2021 1:35 PM  For on call review [email or web address].

## 2021-06-04 NOTE — Plan of Care (Signed)

## 2021-06-04 NOTE — TOC Progression Note (Signed)
Transition of Care [facility]) - Progression Note    Patient Details  Name: Sandra [name] MRN: [id number] Date of Birth: 1940-05-26  Transition of Care [facility] CM/SW Contact  Marlowe Sax, RN Phone Number: 06/04/2021, 10:06 AM  Clinical Narrative:    Patient is a residnet at ALF and will need to go to STR SNF, Bedsearch sent, FL2 completed, PASSR obtqained, will review bed offers once obtained   Expected Discharge Plan: Skilled Nursing Facility Barriers to Discharge: Continued Medical Work up, SNF Pending bed offer, English as a second language teacher  Expected Discharge Plan and Services Expected Discharge Plan: Skilled Nursing Facility       Living arrangements for the past 2 months: Assisted Living Facility                                       Social Determinants of Health (SDOH) Interventions    Readmission Risk Interventions     View : No data to display.

## 2021-06-04 NOTE — TOC Progression Note (Addendum)
Transition of Care [facility]) - Progression Note    Patient Details  Name: Sandra [name] MRN: [id number] Date of Birth: 21-Aug-1940  Transition of Care [facility]) CM/SW Contact  Marlowe Sax, RN Phone Number: 06/04/2021, 2:22 PM  Clinical Narrative:     Spoke with the patient reviewed the bed offers, the patient would like to go to [facility],and called her daughter Rinaldo Cloud to review, she stated [employer] was good, Ins auth process started Pending, ref number [id number]  Expected Discharge Plan: Skilled Nursing Facility Barriers to Discharge: Continued Medical Work up, SNF Pending bed offer, English as a second language teacher  Expected Discharge Plan and Services Expected Discharge Plan: Skilled Nursing Facility       Living arrangements for the past 2 months: Assisted Living Facility                                       Social Determinants of Health (SDOH) Interventions    Readmission Risk Interventions     View : No data to display.

## 2021-06-04 NOTE — Evaluation (Signed)
Physical Therapy Evaluation Patient Details Name: Sandra [name] MRN: [id number] DOB: 1940-08-05 Today's Date: 06/04/2021  History of Present Illness  Pt admitted for L fem neck fx secondary to fall and is s/p L hip hemi on 06/03/21. HIstory includes anxiety and HTN  Clinical Impression  Pt is a pleasant 81 year old female who was admitted for L fem neck fx and is s/p L hip hemi on 06/03/21. Pt is POD 1 at time of evaluation. Pt performs bed mobility/transfers with mod assist and use of RW. Unable to tolerate ambulation this session due to fatigue/pain/weakness. Pt demonstrates deficits with endurance/mobility/pain. Currently isn't at baseline level, agreeable to SNF if needed although is hopeful she will be able to dc home per progress. Would benefit from skilled PT to address above deficits and promote optimal return to PLOF; recommend transition to STR upon discharge from acute hospitalization.      Recommendations for follow up therapy are one component of a multi-disciplinary discharge planning process, led by the attending physician.  Recommendations may be updated based on patient status, additional functional criteria and insurance authorization.  Follow Up Recommendations Skilled nursing-short term rehab (<3 hours/day)    Assistance Recommended at Discharge Intermittent Supervision/Assistance  Patient can return home with the following  A lot of help with walking and/or transfers;A lot of help with bathing/dressing/bathroom;Assistance with cooking/housework;Assist for transportation;Help with stairs or ramp for entrance    Equipment Recommendations Rolling walker (2 wheels);BSC/3in1  Recommendations for Other Services       Functional Status Assessment Patient has had a recent decline in their functional status and demonstrates the ability to make significant improvements in function in a reasonable and predictable amount of time.     Precautions / Restrictions  Precautions Precautions: Fall;Anterior Hip Precaution Booklet Issued: No Restrictions Weight Bearing Restrictions: Yes LLE Weight Bearing: Weight bearing as tolerated      Mobility  Bed Mobility Overal bed mobility: Needs Assistance Bed Mobility: Supine to Sit     Supine to sit: Mod assist     General bed mobility comments: safe technique with ues for initiation of mobility and heavy assist for surgical leg. Once seated, slight R lean, however able to self correct with cues    Transfers Overall transfer level: Needs assistance Equipment used: Rolling walker (2 wheels) Transfers: Bed to chair/wheelchair/BSC     Step pivot transfers: Mod assist       General transfer comment: needs cues for sequencing. Good weight acceptance on surgical leg. RW used    Ambulation/Gait               General Gait Details: only able to transfer between bed->chair, unable to tolerate walking this session  Stairs            Wheelchair Mobility    Modified Rankin (Stroke Patients Only)       Balance Overall balance assessment: Needs assistance Sitting-balance support: Feet supported Sitting balance-Leahy Scale: Good     Standing balance support: Bilateral upper extremity supported Standing balance-Leahy Scale: Fair                               Pertinent Vitals/Pain Pain Assessment Pain Assessment: 0-10 Pain Score: 3  Pain Location: L hip Pain Descriptors / Indicators: Operative site guarding Pain Intervention(s): Limited activity within patient's tolerance, Ice applied, Repositioned    Home Living Family/patient expects to be discharged to::  (ILF)  Additional Comments: from ILF- lives on second story with elevator access. No stairs in apartment    Prior Function Prior Level of Function : Independent/Modified Independent             Mobility Comments: reports no falls history, indep prior. ADLs Comments: indep      Hand Dominance        Extremity/Trunk Assessment   Upper Extremity Assessment Upper Extremity Assessment: Overall WFL for tasks assessed    Lower Extremity Assessment Lower Extremity Assessment: Generalized weakness (L LE grossly 3/5; R LE grossly 4/5)       Communication   Communication: No difficulties  Cognition Arousal/Alertness: Awake/alert Behavior During Therapy: WFL for tasks assessed/performed Overall Cognitive Status: Within Functional Limits for tasks assessed                                 General Comments: alert and pleasant        General Comments      Exercises Other Exercises Other Exercises: seated ther-ex performed on L LE including AP, LAQ, quad sets, and hip abd/add. 10 reps with min assist   Assessment/Plan    PT Assessment Patient needs continued PT services  PT Problem List Decreased strength;Decreased balance;Decreased mobility;Pain;Decreased knowledge of use of DME       PT Treatment Interventions DME instruction;Gait training;Therapeutic exercise    PT Goals (Current goals can be found in the Care Plan section)  Acute Rehab PT Goals Patient Stated Goal: to go home PT Goal Formulation: With patient Time For Goal Achievement: 06/18/21 Potential to Achieve Goals: Good    Frequency BID     Co-evaluation               AM-PAC PT "6 Clicks" Mobility  Outcome Measure Help needed turning from your back to your side while in a flat bed without using bedrails?: A Lot Help needed moving from lying on your back to sitting on the side of a flat bed without using bedrails?: A Lot Help needed moving to and from a bed to a chair (including a wheelchair)?: A Lot Help needed standing up from a chair using your arms (e.g., wheelchair or bedside chair)?: A Lot Help needed to walk in hospital room?: A Lot Help needed climbing 3-5 steps with a railing? : Total 6 Click Score: 11    End of Session Equipment Utilized During  Treatment: Gait belt Activity Tolerance: Patient tolerated treatment well Patient left: in chair (no chair alarm available) Nurse Communication: Mobility status PT Visit Diagnosis: Difficulty in walking, not elsewhere classified (R26.2);History of falling (Z91.81);Pain Pain - Right/Left: Left Pain - part of body: Hip    Time: 0254-2706 PT Time Calculation (min) (ACUTE ONLY): 26 min   Charges:   PT Evaluation $PT Eval Moderate Complexity: 1 Mod PT Treatments $Therapeutic Exercise: 8-22 mins        Elizabeth Palau, PT, DPT, GCS [phone]   Famous Eisenhardt 06/04/2021, 1:23 PM

## 2021-06-04 NOTE — NC FL2 (Signed)
[place] MEDICAID FL2 LEVEL OF CARE SCREENING TOOL     IDENTIFICATION  Patient Name: West Avyanna Birthdate: August 13, 1940 Sex: female Admission Date (Current Location): 06/02/2021  [facility] and [place] Number:  Chiropodist and Address:  [facility], [address] [place], [place] [address]      Provider Number: [id number]  Attending Physician Name and Address:  Marrion Coy, MD  Relative Name and Phone Number:  Juliet Rude [phone]    Current Level of Care: Hospital Recommended Level of Care: Skilled Nursing Facility Prior Approval Number:    Date Approved/Denied:   PASRR Number: [id number]  Discharge Plan: SNF    Current Diagnoses: Patient Active Problem List   Diagnosis Date Noted   Femoral neck fracture (HCC) 06/02/2021   History of fracture of right hip 05/14/2019   Dyslipidemia 05/14/2019   Age-related osteoporosis without current pathological fracture 08/27/2018   Hyponatremia 08/27/2018   Hiatal hernia 10/27/2014   Hypertension, benign 10/27/2014   Female pattern hair loss 10/27/2014   Nocturia 10/27/2014   Left-sided Bell's palsy 10/27/2014   Generalized anxiety disorder 10/27/2014    Orientation RESPIRATION BLADDER Height & Weight     Self, Time, Situation, Place  Normal Continent Weight: 62.1 kg Height:  5\' 2"  (157.5 cm)  BEHAVIORAL SYMPTOMS/MOOD NEUROLOGICAL BOWEL NUTRITION STATUS      Continent Diet (See DC summary)  AMBULATORY STATUS COMMUNICATION OF NEEDS Skin   Extensive Assist Verbally Normal, Surgical wounds                       Personal Care Assistance Level of Assistance  Feeding, Dressing, Bathing Bathing Assistance: Limited assistance Feeding assistance: Independent Dressing Assistance: Maximum assistance     Functional Limitations Info             SPECIAL CARE FACTORS FREQUENCY  PT (By licensed PT), OT (By licensed OT)     PT Frequency: 5 times per week OT Frequency: 5  times per week            Contractures Contractures Info: Not present    Additional Factors Info  Code Status, Allergies Code Status Info: Full code Allergies Info: NKDA           Current Medications (06/04/2021):  This is the current hospital active medication list Current Facility-Administered Medications  Medication Dose Route Frequency Provider Last Rate Last Admin   0.9 %  sodium chloride infusion   Intravenous Continuous 06/06/2021, MD       alum & mag hydroxide-simeth (MAALOX/MYLANTA) 200-200-20 MG/5ML suspension 30 mL  30 mL Oral Q4H PRN 07-26-2000, MD       aspirin chewable tablet 81 mg  81 mg Oral BID Lyndle Herrlich, MD   81 mg at 06/03/21 2159   bisacodyl (DULCOLAX) suppository 10 mg  10 mg Rectal Daily PRN 2160, MD       Chlorhexidine Gluconate Cloth 2 % PADS 6 each  6 each Topical Daily Lyndle Herrlich, MD       cholecalciferol (VITAMIN D3) tablet 2,000 Units  2,000 Units Oral Daily Marrion Coy, MD       docusate sodium (COLACE) capsule 100 mg  100 mg Oral BID Lyndle Herrlich, MD   100 mg at 06/03/21 2159   feeding supplement (ENSURE ENLIVE / ENSURE PLUS) liquid 237 mL  237 mL Oral BID BM 2160, MD       HYDROcodone-acetaminophen (  NORCO/VICODIN) 5-325 MG per tablet 1-2 tablet  1-2 tablet Oral Q6H PRN Lyndle Herrlich, MD   2 tablet at 06/04/21 0713   lisinopril (ZESTRIL) tablet 2.5 mg  2.5 mg Oral Daily Lyndle Herrlich, MD       magnesium hydroxide (MILK OF MAGNESIA) suspension 30 mL  30 mL Oral Daily PRN Lyndle Herrlich, MD   30 mL at 06/04/21 0455   menthol-cetylpyridinium (CEPACOL) lozenge 3 mg  1 lozenge Oral PRN Lyndle Herrlich, MD       Or   phenol (CHLORASEPTIC) mouth spray 1 spray  1 spray Mouth/Throat PRN Lyndle Herrlich, MD       methocarbamol (ROBAXIN) tablet 500 mg  500 mg Oral Q6H PRN Lyndle Herrlich, MD   500 mg at 06/03/21 0304   Or   methocarbamol (ROBAXIN) 500 mg in dextrose 5 % 50 mL IVPB  500 mg Intravenous Q6H PRN  Lyndle Herrlich, MD       metoCLOPramide (REGLAN) tablet 5-10 mg  5-10 mg Oral Q8H PRN Lyndle Herrlich, MD       Or   metoCLOPramide (REGLAN) injection 5-10 mg  5-10 mg Intravenous Q8H PRN Lyndle Herrlich, MD       morphine (PF) 2 MG/ML injection 0.5 mg  0.5 mg Intravenous Q2H PRN Lyndle Herrlich, MD   0.5 mg at 06/04/21 0004   multivitamin with minerals tablet 1 tablet  1 tablet Oral Daily Lyndle Herrlich, MD       ondansetron [facility]) tablet 4 mg  4 mg Oral Q6H PRN Lyndle Herrlich, MD       Or   ondansetron [facility]) injection 4 mg  4 mg Intravenous Q6H PRN Lyndle Herrlich, MD       pantoprazole (PROTONIX) EC tablet 40 mg  40 mg Oral Daily Lyndle Herrlich, MD   40 mg at 06/02/21 2123   senna (SENOKOT) tablet 8.6 mg  1 tablet Oral BID Lyndle Herrlich, MD   8.6 mg at 06/03/21 2159   sodium chloride tablet 1 g  1 g Oral BID WC Marrion Coy, MD   1 g at 06/04/21 1610     Discharge Medications: Please see discharge summary for a list of discharge medications.  Relevant Imaging Results:  Relevant Lab Results:   Additional Information [id number]  Marlowe Sax, RN

## 2021-06-04 NOTE — Anesthesia Postprocedure Evaluation (Signed)
Anesthesia Post Note  Patient: Sandra [name]  Procedure(s) Performed: ARTHROPLASTY BIPOLAR HIP (HEMIARTHROPLASTY) (Left: Hip)  Patient location during evaluation: Nursing Unit Anesthesia Type: Spinal Level of consciousness: oriented and awake and alert Pain management: pain level controlled Vital Signs Assessment: post-procedure vital signs reviewed and stable Respiratory status: spontaneous breathing and respiratory function stable Cardiovascular status: blood pressure returned to baseline and stable Postop Assessment: no headache, no backache, no apparent nausea or vomiting and patient able to bend at knees Anesthetic complications: no   No notable events documented.   Last Vitals:  Vitals:   06/04/21 0301 06/04/21 0737  BP: (!) 147/68 (!) 138/57  Pulse: 75 79  Resp: 15 19  Temp: 36.9 C 37.2 C  SpO2: 98% 98%    Last Pain:  Vitals:   06/04/21 0034  TempSrc:   PainSc: Asleep                 Jeanine Luz

## 2021-06-04 NOTE — Progress Notes (Signed)
Physical Therapy Treatment Patient Details Name: Sandra [name] MRN: [id number] DOB: 07-16-1940 Today's Date: 06/04/2021   History of Present Illness Pt admitted for L fem neck fx secondary to fall and is s/p L hip hemi on 06/03/21. HIstory includes anxiety and HTN    PT Comments    Pt is making good progress towards goals with ability to ambulate short distance in room using RW. Still limited by pain with weight bearing. Slow step to gait pattern performed. Able to perform supine there-ex and ambulate to/from [facility] this session. Still recommending SNF at this time. WIll continue to progress.    Recommendations for follow up therapy are one component of a multi-disciplinary discharge planning process, led by the attending physician.  Recommendations may be updated based on patient status, additional functional criteria and insurance authorization.  Follow Up Recommendations  Skilled nursing-short term rehab (<3 hours/day)     Assistance Recommended at Discharge Intermittent Supervision/Assistance  Patient can return home with the following A lot of help with walking and/or transfers;A lot of help with bathing/dressing/bathroom;Assistance with cooking/housework;Assist for transportation;Help with stairs or ramp for entrance   Equipment Recommendations  Rolling walker (2 wheels);BSC/3in1    Recommendations for Other Services       Precautions / Restrictions Precautions Precautions: Fall;Anterior Hip Precaution Booklet Issued: No Restrictions Weight Bearing Restrictions: Yes LLE Weight Bearing: Weight bearing as tolerated     Mobility  Bed Mobility Overal bed mobility: Needs Assistance Bed Mobility: Sit to Supine     Supine to sit: Mod assist Sit to supine: Mod assist   General bed mobility comments: needs assist for B LE sequencing and repositioning in bed. FOllows commands well    Transfers Overall transfer level: Needs assistance Equipment used: Rolling walker (2  wheels) Transfers: Sit to/from Stand Sit to Stand: Min assist   Step pivot transfers: Mod assist       General transfer comment: able to initiate standing attempts. RW used. Once standing, upright posture with good weight acceptance on surgical leg    Ambulation/Gait Ambulation/Gait assistance: Min assist Gait Distance (Feet): 20 Feet Assistive device: Rolling walker (2 wheels) Gait Pattern/deviations: Step-to pattern       General Gait Details: ambualted with slow step to gait pattern using RW. Pt limited due to fatigue   Stairs             Wheelchair Mobility    Modified Rankin (Stroke Patients Only)       Balance Overall balance assessment: Needs assistance Sitting-balance support: Feet supported Sitting balance-Leahy Scale: Good     Standing balance support: Bilateral upper extremity supported Standing balance-Leahy Scale: Fair                              Cognition Arousal/Alertness: Awake/alert Behavior During Therapy: WFL for tasks assessed/performed Overall Cognitive Status: Within Functional Limits for tasks assessed                                 General Comments: alert and pleasant        Exercises Other Exercises Other Exercises: supine ther-ex performed on L LE including Quad sets, SAQ, LAQ (seated), and hip abd/add. 10 reps with min assist Other Exercises: ambulated to [facility] with min assist. Needs mod assist for transfer on/off. Hygiene with supervision. Able to void, CNA notified.    General Comments  Pertinent Vitals/Pain Pain Assessment Pain Assessment: 0-10 Pain Score: 3  Pain Location: L hip Pain Descriptors / Indicators: Operative site guarding Pain Intervention(s): Limited activity within patient's tolerance, Repositioned    Home Living Family/patient expects to be discharged to::  (ILF)                   Additional Comments: from ILF- lives on second story with elevator access. No  stairs in apartment    Prior Function            PT Goals (current goals can now be found in the care plan section) Acute Rehab PT Goals Patient Stated Goal: to go home PT Goal Formulation: With patient Time For Goal Achievement: 06/18/21 Potential to Achieve Goals: Good Additional Goals Additional Goal #1: Pt will be able to perform bed mobility/transfers with supervision and safe technique in order to improve functional independence Progress towards PT goals: Progressing toward goals    Frequency    BID      PT Plan Current plan remains appropriate    Co-evaluation              AM-PAC PT "6 Clicks" Mobility   Outcome Measure  Help needed turning from your back to your side while in a flat bed without using bedrails?: A Lot Help needed moving from lying on your back to sitting on the side of a flat bed without using bedrails?: A Lot Help needed moving to and from a bed to a chair (including a wheelchair)?: A Lot Help needed standing up from a chair using your arms (e.g., wheelchair or bedside chair)?: A Lot Help needed to walk in hospital room?: A Lot Help needed climbing 3-5 steps with a railing? : Total 6 Click Score: 11    End of Session Equipment Utilized During Treatment: Gait belt Activity Tolerance: Patient tolerated treatment well Patient left: in bed;with bed alarm set;with SCD's reapplied Nurse Communication: Mobility status PT Visit Diagnosis: Difficulty in walking, not elsewhere classified (R26.2);History of falling (Z91.81);Pain Pain - Right/Left: Left Pain - part of body: Hip     Time: 1347-1410 PT Time Calculation (min) (ACUTE ONLY): 23 min  Charges:  $Gait Training: 8-22 mins $Therapeutic Exercise: 8-22 mins                     Greggory Stallion, PT, DPT, GCS [phone]    Sandra [name] 06/04/2021, 3:55 PM

## 2021-06-04 NOTE — Care Management Important Message (Signed)
Important Message  Patient Details  Name: Sandra [name] MRN: [id number] Date of Birth: 25-Apr-1940   Medicare Important Message Given:  N/A - LOS <3 / Initial given by admissions     [name] 06/04/2021, 9:04 AM

## 2021-06-05 DIAGNOSIS — S72002K Fracture of unspecified part of neck of left femur, subsequent encounter for closed fracture with nonunion: Secondary | ICD-10-CM | POA: Diagnosis not present

## 2021-06-05 DIAGNOSIS — I10 Essential (primary) hypertension: Secondary | ICD-10-CM | POA: Diagnosis not present

## 2021-06-05 DIAGNOSIS — D62 Acute posthemorrhagic anemia: Secondary | ICD-10-CM | POA: Diagnosis not present

## 2021-06-05 DIAGNOSIS — E871 Hypo-osmolality and hyponatremia: Secondary | ICD-10-CM | POA: Diagnosis not present

## 2021-06-05 LAB — MAGNESIUM: Magnesium: 2.2 mg/dL (ref 1.7–2.4)

## 2021-06-05 LAB — CBC
HCT: 28 % — ABNORMAL LOW (ref 36.0–46.0)
Hemoglobin: 9.5 g/dL — ABNORMAL LOW (ref 12.0–15.0)
MCH: 30.7 pg (ref 26.0–34.0)
MCHC: 33.9 g/dL (ref 30.0–36.0)
MCV: 90.6 fL (ref 80.0–100.0)
Platelets: 156 10*3/uL (ref 150–400)
RBC: 3.09 MIL/uL — ABNORMAL LOW (ref 3.87–5.11)
RDW: 13.1 % (ref 11.5–15.5)
WBC: 10.6 10*3/uL — ABNORMAL HIGH (ref 4.0–10.5)
nRBC: 0 % (ref 0.0–0.2)

## 2021-06-05 LAB — BASIC METABOLIC PANEL
Anion gap: 5 (ref 5–15)
BUN: 24 mg/dL — ABNORMAL HIGH (ref 8–23)
CO2: 26 mmol/L (ref 22–32)
Calcium: 8.2 mg/dL — ABNORMAL LOW (ref 8.9–10.3)
Chloride: 98 mmol/L (ref 98–111)
Creatinine, Ser: 0.77 mg/dL (ref 0.44–1.00)
GFR, Estimated: >60 mL/min (ref >60)
Glucose, Bld: 102 mg/dL — ABNORMAL HIGH (ref 70–99)
Potassium: 4.3 mmol/L (ref 3.5–5.1)
Sodium: 129 mmol/L — ABNORMAL LOW (ref 135–145)

## 2021-06-05 NOTE — TOC Progression Note (Signed)
Transition of Care [facility]) - Progression Note    Patient Details  Name: Sandra [name] MRN: [id number] Date of Birth: 07-16-1940  [facility]) CM/SW Contact  [address], [place], [place] Phone Number: 06/05/2021, 10:30 AM  Clinical Narrative:     Insurance authorization received for SNF stay 06/06/21-06/09/21 ref# [id number]  Phone call to Verlon Au to discuss admission. They do not accept admissions on Sundays. Patient to admit on Monday 06/07/21.  [address], LCSW Transition of Care [phone]   Expected Discharge Plan: Skilled Nursing Facility Barriers to Discharge: Continued Medical Work up, SNF Pending bed offer, Insurance Authorization  Expected Discharge Plan and Services Expected Discharge Plan: Skilled Nursing Facility       Living arrangements for the past 2 months: Assisted Living Facility                                       Social Determinants of Health (SDOH) Interventions    Readmission Risk Interventions     View : No data to display.

## 2021-06-05 NOTE — Progress Notes (Signed)
Subjective: 2 Days Post-Op Procedure(s) (LRB): ARTHROPLASTY BIPOLAR HIP (HEMIARTHROPLASTY) (Left) The patient is alert and awake and oriented in her bed.  She does not complain of significant pain.  Dressing is dry and leg lengths are good.  Minimal pain with movement of the hip.  Hemoglobin stable.    Patient reports pain as mild.  Objective:   VITALS:   Vitals:   06/05/21 0609 06/05/21 0737  BP: 119/66 (!) 121/58  Pulse: 82 73  Resp: 18 18  Temp: 98 F (36.7 C) 98.1 F (36.7 C)  SpO2: 99% 100%    Neurologically intact Incision: dressing C/D/I  LABS Recent Labs    06/03/21 0347 06/04/21 0540 06/05/21 0514  HGB 11.6* 11.2* 9.5*  HCT 33.8* 31.5* 28.0*  WBC 8.5 12.8* 10.6*  PLT 186 157 156    Recent Labs    06/03/21 0347 06/04/21 0540 06/05/21 0514  NA 130* 128* 129*  K 4.0 3.9 4.3  BUN 22 22 24*  CREATININE 0.87 0.94 0.77  GLUCOSE 115* 141* 102*    No results for input(s): LABPT, INR in the last 72 hours.   Assessment/Plan: 2 Days Post-Op Procedure(s) (LRB): ARTHROPLASTY BIPOLAR HIP (HEMIARTHROPLASTY) (Left)   Advance diet Up with therapy D/C IV fluids Discharge to SNF when stable.  Follow-up with Dr. Odis Luster in 2 weeks. Discharge on ASA for DVT prophylaxis

## 2021-06-05 NOTE — Progress Notes (Signed)
Physical Therapy Treatment Patient Details Name: Sandra [name] MRN: [id number] DOB: 01-06-1941 Today's Date: 06/05/2021   History of Present Illness Pt admitted for L fem neck fx secondary to fall and is s/p L hip hemi on 06/03/21. HIstory includes anxiety and HTN    PT Comments    The pt is excited to progress with therapy this afternoon. Per her RN she had not urinated all day, therefore, therapy focused on ambulation and transfer to the bathroom. She requires Mod A for sit<>stand from toilet height, but is able to care for hygiene needs with supervision. The pt participated in exercise and is looking forward to PT tomorrow. Current plan remain appropriate.     Recommendations for follow up therapy are one component of a multi-disciplinary discharge planning process, led by the attending physician.  Recommendations may be updated based on patient status, additional functional criteria and insurance authorization.  Follow Up Recommendations  Skilled nursing-short term rehab (<3 hours/day)     Assistance Recommended at Discharge Intermittent Supervision/Assistance  Patient can return home with the following A lot of help with walking and/or transfers;A lot of help with bathing/dressing/bathroom;Assistance with cooking/housework;Assist for transportation;Help with stairs or ramp for entrance   Equipment Recommendations  Rolling walker (2 wheels);BSC/3in1    Recommendations for Other Services       Precautions / Restrictions Precautions Precautions: Fall;Anterior Hip Precaution Booklet Issued: No Restrictions Weight Bearing Restrictions: Yes LLE Weight Bearing: Weight bearing as tolerated     Mobility  Bed Mobility Overal bed mobility: Needs Assistance Bed Mobility: Supine to Sit, Sit to Supine     Supine to sit: Mod assist Sit to supine: Mod assist        Transfers Overall transfer level: Needs assistance Equipment used: Rolling walker (2 wheels) Transfers: Sit  to/from Stand Sit to Stand: Min guard                Ambulation/Gait Ambulation/Gait assistance: Min guard Gait Distance (Feet): 30 Feet Assistive device: Rolling walker (2 wheels) Gait Pattern/deviations: Step-to pattern, Antalgic, Trunk flexed       General Gait Details: forefoot contact on the L with limited to no foot flat contact during stance phase.   Stairs             Wheelchair Mobility    Modified Rankin (Stroke Patients Only)       Balance Overall balance assessment: Needs assistance Sitting-balance support: Feet supported Sitting balance-Leahy Scale: Good     Standing balance support: During functional activity, Bilateral upper extremity supported Standing balance-Leahy Scale: Fair                              Cognition Arousal/Alertness: Awake/alert Behavior During Therapy: WFL for tasks assessed/performed Overall Cognitive Status: Within Functional Limits for tasks assessed                                          Exercises Total Joint Exercises Long Arc Quad: AROM, Right, Left, 10 reps    General Comments General comments (skin integrity, edema, etc.): reviewed ther ex in bed to promote strengthening. Pt demonstartes understanding and tolerated well.      Pertinent Vitals/Pain Pain Assessment Pain Score: 2  Pain Location: L hip Pain Descriptors / Indicators: Operative site guarding Pain Intervention(s): Utilized relaxation techniques    Home  Living                          Prior Function            PT Goals (current goals can now be found in the care plan section) Acute Rehab PT Goals Patient Stated Goal: to go home PT Goal Formulation: With patient Time For Goal Achievement: 06/18/21 Potential to Achieve Goals: Good Additional Goals Additional Goal #1: Pt will be able to perform bed mobility/transfers with supervision and safe technique in order to improve functional  independence Progress towards PT goals: Progressing toward goals    Frequency    BID      PT Plan Current plan remains appropriate    Co-evaluation              AM-PAC PT "6 Clicks" Mobility   Outcome Measure  Help needed turning from your back to your side while in a flat bed without using bedrails?: A Lot Help needed moving from lying on your back to sitting on the side of a flat bed without using bedrails?: A Lot Help needed moving to and from a bed to a chair (including a wheelchair)?: A Lot Help needed standing up from a chair using your arms (e.g., wheelchair or bedside chair)?: A Lot Help needed to walk in hospital room?: A Little Help needed climbing 3-5 steps with a railing? : A Lot 6 Click Score: 13    End of Session Equipment Utilized During Treatment: Gait belt Activity Tolerance: Patient tolerated treatment well Patient left: in bed;with bed alarm set;with SCD's reapplied Nurse Communication: Mobility status PT Visit Diagnosis: Difficulty in walking, not elsewhere classified (R26.2);History of falling (Z91.81);Pain Pain - Right/Left: Left Pain - part of body: Hip     Time: 6759-1638 PT Time Calculation (min) (ACUTE ONLY): 29 min  Charges:  $Gait Training: 8-22 mins $Therapeutic Exercise: 8-22 mins                     2:53 PM, 06/05/21 Kailei Cowens A. Mordecai Maes PT, DPT Physical Therapist - [facility]    Treysean Petruzzi A Fue Cervenka 06/05/2021, 2:50 PM

## 2021-06-05 NOTE — Progress Notes (Signed)
  Progress Note   Patient: Sandra [name] [id number]:[id number] DOB: 1940/06/02 DOA: 06/02/2021     3 DOS: the patient was seen and examined on 06/05/2021   Brief hospital course: [place] P C[place] is a 81 y.o. female with medical history significant for hypertension, anxiety disorder, hiatal hernia who presents to the ER via EMS from an assisted living facility after she had a witnessed ground-level mechanical fall.  He sustained a left femoral neck fracture, left hip hemiarthroplasty performed on 5/18.  Assessment and Plan: Left femoral neck fracture status post surgery. Acute blood loss anemia. Patient did well with physical therapy, currently pending nursing home placement.  Her appetite has improved, IV fluids discontinued.  She developed some anemia, no need of transfusion.  Recheck level tomorrow.  Hyponatremia secondary to SIADH. Continue salt tablets, advised patient to drink less than half a gallon water a day.  Essential hypertension. Continue lisinopril.     Subjective:  Patient doing well, good appetite, no nausea vomiting. Was able to walk with physical therapist.  Physical Exam: Vitals:   06/04/21 1603 06/04/21 2006 06/05/21 0609 06/05/21 0737  BP: (!) 118/59 113/63 119/66 (!) 121/58  Pulse: 86 90 82 73  Resp: 16 16 18 18   Temp: 98.6 F (37 C) 98.5 F (36.9 C) 98 F (36.7 C) 98.1 F (36.7 C)  TempSrc:      SpO2: 100% 100% 99% 100%  Weight:      Height:       General exam: Appears calm and comfortable  Respiratory system: Clear to auscultation. Respiratory effort normal. Cardiovascular system: S1 & S2 heard, RRR. No JVD, murmurs, rubs, gallops or clicks. No pedal edema. Gastrointestinal system: Abdomen is nondistended, soft and nontender. No organomegaly or masses felt. Normal bowel sounds heard. Central nervous system: Alert and oriented. No focal neurological deficits. Extremities: Symmetric 5 x 5 power. Skin: No rashes, lesions or ulcers Psychiatry:  Judgement and insight appear normal. Mood & affect appropriate.   Data Reviewed:  Lab results reviewed  Family Communication:   Disposition: Status is: Inpatient Remains inpatient appropriate because: Severity of disease, pending nursing home placement.  Planned Discharge Destination: Skilled nursing facility    Time spent: 28 minutes  Author: , MD 06/05/2021 11:21 AM  For on call review [email or web address].

## 2021-06-05 NOTE — Progress Notes (Signed)
Physical Therapy Treatment Patient Details Name: Sandra [name] MRN: [id number] DOB: 1940-01-25 Today's Date: 06/05/2021   History of Present Illness Pt admitted for L fem neck fx secondary to fall and is s/p L hip hemi on 06/03/21. HIstory includes anxiety and HTN    PT Comments    Pt was long sitting in bed upon arriving. She is agreeable to session and cooperative throughout. Continues to require assistance to safely exit bed, stand, and to ambulate with RW. Rehab is still most appropriate DC disposition to address deficits while maximizing independence with ADLs. Pt lives alone and continues to require assistance with ADL.    Recommendations for follow up therapy are one component of a multi-disciplinary discharge planning process, led by the attending physician.  Recommendations may be updated based on patient status, additional functional criteria and insurance authorization.  Follow Up Recommendations  Skilled nursing-short term rehab (<3 hours/day)     Assistance Recommended at Discharge Intermittent Supervision/Assistance  Patient can return home with the following A lot of help with walking and/or transfers;A lot of help with bathing/dressing/bathroom;Assistance with cooking/housework;Assist for transportation;Help with stairs or ramp for entrance   Equipment Recommendations  Rolling walker (2 wheels);BSC/3in1       Precautions / Restrictions Precautions Precautions: Fall;Anterior Hip Precaution Booklet Issued: No Restrictions Weight Bearing Restrictions: Yes LLE Weight Bearing: Weight bearing as tolerated     Mobility  Bed Mobility Overal bed mobility: Needs Assistance Bed Mobility: Supine to Sit, Sit to Supine     Supine to sit: Min assist Sit to supine: Min assist   General bed mobility comments: increased time to perform with vcs for improved technique    Transfers Overall transfer level: Needs assistance Equipment used: Rolling walker (2  wheels) Transfers: Sit to/from Stand Sit to Stand: Min assist    General transfer comment: Min assist from lowest bed height    Ambulation/Gait Ambulation/Gait assistance: Min guard Gait Distance (Feet): 50 Feet Assistive device: Rolling walker (2 wheels) Gait Pattern/deviations: Step-to pattern, Antalgic, Trunk flexed Gait velocity: decreased     General Gait Details: Pt was able to ambululate ~ 50 ft with RW in room. CGA for safety with vcs for posture correction and overall improved gait sequencing. she tolerated well but requested to returnt o bed afterwards      Balance Overall balance assessment: Needs assistance Sitting-balance support: Feet supported Sitting balance-Leahy Scale: Good     Standing balance support: Bilateral upper extremity supported Standing balance-Leahy Scale: Fair      Cognition Arousal/Alertness: Awake/alert Behavior During Therapy: WFL for tasks assessed/performed Overall Cognitive Status: Within Functional Limits for tasks assessed      General Comments: alert and pleasant           General Comments General comments (skin integrity, edema, etc.): reviewed ther ex in bed to promote strengthening. Pt demonstartes understanding and tolerated well.      Pertinent Vitals/Pain Pain Assessment Pain Assessment: 0-10 Pain Score: 2  Pain Location: L hip Pain Descriptors / Indicators: Operative site guarding Pain Intervention(s): Limited activity within patient's tolerance, Monitored during session, Premedicated before session, Repositioned, Ice applied     PT Goals (current goals can now be found in the care plan section) Acute Rehab PT Goals Patient Stated Goal: to go home Progress towards PT goals: Progressing toward goals    Frequency    BID      PT Plan Current plan remains appropriate       AM-PAC PT "6 Clicks" Mobility  Outcome Measure  Help needed turning from your back to your side while in a flat bed without using  bedrails?: A Lot Help needed moving from lying on your back to sitting on the side of a flat bed without using bedrails?: A Lot Help needed moving to and from a bed to a chair (including a wheelchair)?: A Lot Help needed standing up from a chair using your arms (e.g., wheelchair or bedside chair)?: A Lot Help needed to walk in hospital room?: A Little Help needed climbing 3-5 steps with a railing? : A Lot 6 Click Score: 13    End of Session   Activity Tolerance: Patient tolerated treatment well Patient left: in bed;with bed alarm set;with SCD's reapplied Nurse Communication: Mobility status PT Visit Diagnosis: Difficulty in walking, not elsewhere classified (R26.2);History of falling (Z91.81);Pain Pain - Right/Left: Left Pain - part of body: Hip     Time: 5638-7564 PT Time Calculation (min) (ACUTE ONLY): 17 min  Charges:  $Gait Training: 8-22 mins                     Jetta Lout PTA 06/05/21, 12:02 PM

## 2021-06-06 DIAGNOSIS — I10 Essential (primary) hypertension: Secondary | ICD-10-CM | POA: Diagnosis not present

## 2021-06-06 DIAGNOSIS — E871 Hypo-osmolality and hyponatremia: Secondary | ICD-10-CM | POA: Diagnosis not present

## 2021-06-06 DIAGNOSIS — D62 Acute posthemorrhagic anemia: Secondary | ICD-10-CM | POA: Diagnosis not present

## 2021-06-06 DIAGNOSIS — S72002K Fracture of unspecified part of neck of left femur, subsequent encounter for closed fracture with nonunion: Secondary | ICD-10-CM | POA: Diagnosis not present

## 2021-06-06 LAB — BASIC METABOLIC PANEL
Anion gap: 5 (ref 5–15)
BUN: 20 mg/dL (ref 8–23)
CO2: 25 mmol/L (ref 22–32)
Calcium: 7.9 mg/dL — ABNORMAL LOW (ref 8.9–10.3)
Chloride: 101 mmol/L (ref 98–111)
Creatinine, Ser: 0.69 mg/dL (ref 0.44–1.00)
GFR, Estimated: >60 mL/min (ref >60)
Glucose, Bld: 90 mg/dL (ref 70–99)
Potassium: 3.9 mmol/L (ref 3.5–5.1)
Sodium: 131 mmol/L — ABNORMAL LOW (ref 135–145)

## 2021-06-06 LAB — CBC
HCT: 26.2 % — ABNORMAL LOW (ref 36.0–46.0)
Hemoglobin: 8.9 g/dL — ABNORMAL LOW (ref 12.0–15.0)
MCH: 31.7 pg (ref 26.0–34.0)
MCHC: 34 g/dL (ref 30.0–36.0)
MCV: 93.2 fL (ref 80.0–100.0)
Platelets: 164 10*3/uL (ref 150–400)
RBC: 2.81 MIL/uL — ABNORMAL LOW (ref 3.87–5.11)
RDW: 13.4 % (ref 11.5–15.5)
WBC: 8.2 10*3/uL (ref 4.0–10.5)
nRBC: 0 % (ref 0.0–0.2)

## 2021-06-06 NOTE — Progress Notes (Signed)
  Progress Note   Patient: Sandra [name] NLZ:[id number] DOB: [birth year] DOA: 06/02/2021     4 DOS: the patient was seen and examined on 06/06/2021   Brief hospital course: [place] is a 81 y.o. female with medical history significant for hypertension, anxiety disorder, hiatal hernia who presents to the ER via EMS from an assisted living facility after she had a witnessed ground-level mechanical fall.  He sustained a left femoral neck fracture, left hip hemiarthroplasty performed on 5/18.  Assessment and Plan: Left femoral neck fracture status post surgery. Acute blood loss anemia. Patient has a better appetite, able to walk with physical therapy.  Hemoglobin stable.  Currently pending for nursing placement.  Hyponatremia secondary to SIADH. Sodium level is better at 131, continue salt tablets.   Essential hypertension. No change treatment plan      Subjective:  Patient doing well, currently pending nursing home placement.  Was able to walk with physical therapy.  Physical Exam: Vitals:   06/05/21 1606 06/05/21 1951 06/06/21 0407 06/06/21 0743  BP: 134/68 124/64 132/61 (!) 131/57  Pulse: 83 88 79 79  Resp: 18 17 17 16   Temp: 98.3 F (36.8 C) 99.1 F (37.3 C) 98.6 F (37 C) 98.4 F (36.9 C)  TempSrc: Oral  Oral   SpO2: 100% 99% 100% 93%  Weight:      Height:       General exam: Appears calm and comfortable  Respiratory system: Clear to auscultation. Respiratory effort normal. Cardiovascular system: S1 & S2 heard, RRR. No JVD, murmurs, rubs, gallops or clicks. No pedal edema. Gastrointestinal system: Abdomen is nondistended, soft and nontender. No organomegaly or masses felt. Normal bowel sounds heard. Central nervous system: Alert and oriented. No focal neurological deficits. Extremities: Symmetric 5 x 5 power. Skin: No rashes, lesions or ulcers Psychiatry: Judgement and insight appear normal. Mood & affect appropriate.   Data Reviewed:  Lab results  reviewed.  Family Communication: Son updated at bedside  Disposition: Status is: Inpatient Remains inpatient appropriate because: Unsafe discharge, pending nursing home placement  Planned Discharge Destination: Skilled nursing facility    Time spent: 28 minutes  Author: , MD 06/06/2021 12:26 PM  For on call review [email or web address].

## 2021-06-06 NOTE — Plan of Care (Signed)

## 2021-06-06 NOTE — Progress Notes (Signed)
Physical Therapy Treatment Patient Details Name: Sandra [name] MRN: [id number] DOB: 05/26/1940 Today's Date: 06/06/2021   History of Present Illness Pt admitted for L fem neck fx secondary to fall and is s/p L hip hemi on 06/03/21. HIstory includes anxiety and HTN    PT Comments    Pt ready for session.  Reports no pain at rest but does increase with movement.  To EOB with increased time and rails but only needs light min a x 1.  Steady in sitting. Stood to [employer] with min a x 1 for standing AROM LLE X 10.  Seated rest before gait with RW and min guard/assist to door and back limited by fatigue .  She remains in recliner with needs met after session.    Recommendations for follow up therapy are one component of a multi-disciplinary discharge planning process, led by the attending physician.  Recommendations may be updated based on patient status, additional functional criteria and insurance authorization.  Follow Up Recommendations  Skilled nursing-short term rehab (<3 hours/day)     Assistance Recommended at Discharge    Patient can return home with the following Assistance with cooking/housework;Assist for transportation;Help with stairs or ramp for entrance;A little help with walking and/or transfers;A little help with bathing/dressing/bathroom   Equipment Recommendations  Rolling walker (2 wheels);BSC/3in1    Recommendations for Other Services       Precautions / Restrictions Precautions Precautions: Fall;Anterior Hip Precaution Booklet Issued: No Restrictions Weight Bearing Restrictions: Yes LLE Weight Bearing: Weight bearing as tolerated     Mobility  Bed Mobility Overal bed mobility: Needs Assistance Bed Mobility: Supine to Sit     Supine to sit: Min assist     General bed mobility comments: increased time and use of rails but does well    Transfers Overall transfer level: Needs assistance Equipment used: Rolling walker (2 wheels) Transfers: Sit to/from  Stand Sit to Stand: Min guard, Min assist                Ambulation/Gait Ambulation/Gait assistance: Min guard, Min assist Gait Distance (Feet): 30 Feet Assistive device: Rolling walker (2 wheels) Gait Pattern/deviations: Step-to pattern, Antalgic, Trunk flexed Gait velocity: decreased         Stairs             Wheelchair Mobility    Modified Rankin (Stroke Patients Only)       Balance Overall balance assessment: Needs assistance Sitting-balance support: Feet supported Sitting balance-Leahy Scale: Good     Standing balance support: During functional activity, Bilateral upper extremity supported Standing balance-Leahy Scale: Fair                              Cognition Arousal/Alertness: Awake/alert Behavior During Therapy: WFL for tasks assessed/performed Overall Cognitive Status: Within Functional Limits for tasks assessed                                 General Comments: alert and pleasant        Exercises Other Exercises Other Exercises: standing AROM x 10 with cues and min gaurd for balance    General Comments        Pertinent Vitals/Pain Pain Assessment Pain Assessment: Faces Faces Pain Scale: Hurts little more Pain Location: L hip Pain Descriptors / Indicators: Operative site guarding Pain Intervention(s): Limited activity within patient's tolerance, Monitored during session, Repositioned  Home Living                          Prior Function            PT Goals (current goals can now be found in the care plan section) Progress towards PT goals: Progressing toward goals    Frequency    BID      PT Plan Current plan remains appropriate    Co-evaluation              AM-PAC PT "6 Clicks" Mobility   Outcome Measure  Help needed turning from your back to your side while in a flat bed without using bedrails?: A Lot Help needed moving from lying on your back to sitting on the  side of a flat bed without using bedrails?: A Lot Help needed moving to and from a bed to a chair (including a wheelchair)?: A Little Help needed standing up from a chair using your arms (e.g., wheelchair or bedside chair)?: A Little Help needed to walk in hospital room?: A Little Help needed climbing 3-5 steps with a railing? : A Lot 6 Click Score: 15    End of Session Equipment Utilized During Treatment: Gait belt Activity Tolerance: Patient tolerated treatment well Patient left: in bed;with bed alarm set;with SCD's reapplied Nurse Communication: Mobility status PT Visit Diagnosis: Difficulty in walking, not elsewhere classified (R26.2);History of falling (Z91.81);Pain Pain - Right/Left: Left Pain - part of body: Hip     Time: 3013-1438 PT Time Calculation (min) (ACUTE ONLY): 17 min  Charges:  $Gait Training: 8-22 mins                   Chesley Noon, PTA 06/06/21, 9:37 AM

## 2021-06-06 NOTE — Progress Notes (Signed)
Subjective: 3 Days Post-Op Procedure(s) (LRB): ARTHROPLASTY BIPOLAR HIP (HEMIARTHROPLASTY) (Left)   Patient is alert awake and oriented.  She is lying comfortably in bed.  She is been up to a chair earlier.  Dressing is dry.  Hemoglobin is 8.9.  Pain is moderate.  Patient reports pain as mild.  Objective:   VITALS:   Vitals:   06/06/21 0407 06/06/21 0743  BP: 132/61 (!) 131/57  Pulse: 79 79  Resp: 17 16  Temp: 98.6 F (37 C) 98.4 F (36.9 C)  SpO2: 100% 93%    Neurologically intact Incision: dressing C/D/I  LABS Recent Labs    06/04/21 0540 06/05/21 0514 06/06/21 0506  HGB 11.2* 9.5* 8.9*  HCT 31.5* 28.0* 26.2*  WBC 12.8* 10.6* 8.2  PLT 157 156 164    Recent Labs    06/04/21 0540 06/05/21 0514 06/06/21 0506  NA 128* 129* 131*  K 3.9 4.3 3.9  BUN 22 24* 20  CREATININE 0.94 0.77 0.69  GLUCOSE 141* 102* 90    No results for input(s): LABPT, INR in the last 72 hours.   Assessment/Plan: 3 Days Post-Op Procedure(s) (LRB): ARTHROPLASTY BIPOLAR HIP (HEMIARTHROPLASTY) (Left)   Advance diet Up with therapy D/C IV fluids Discharge to SNF when stable medically.  Follow-up in 2 weeks with Dr. Odis Luster.  Enteric-coated aspirin for DVT prophylaxis.

## 2021-06-07 ENCOUNTER — Encounter: Payer: Self-pay | Admitting: Orthopedic Surgery

## 2021-06-07 DIAGNOSIS — R296 Repeated falls: Secondary | ICD-10-CM | POA: Diagnosis not present

## 2021-06-07 DIAGNOSIS — W19XXXD Unspecified fall, subsequent encounter: Secondary | ICD-10-CM | POA: Diagnosis not present

## 2021-06-07 DIAGNOSIS — I959 Hypotension, unspecified: Secondary | ICD-10-CM | POA: Diagnosis not present

## 2021-06-07 DIAGNOSIS — Z7982 Long term (current) use of aspirin: Secondary | ICD-10-CM | POA: Insufficient documentation

## 2021-06-07 DIAGNOSIS — Z8781 Personal history of (healed) traumatic fracture: Secondary | ICD-10-CM | POA: Insufficient documentation

## 2021-06-07 DIAGNOSIS — K449 Diaphragmatic hernia without obstruction or gangrene: Secondary | ICD-10-CM | POA: Insufficient documentation

## 2021-06-07 DIAGNOSIS — S72002D Fracture of unspecified part of neck of left femur, subsequent encounter for closed fracture with routine healing: Secondary | ICD-10-CM | POA: Insufficient documentation

## 2021-06-07 DIAGNOSIS — K5904 Chronic idiopathic constipation: Secondary | ICD-10-CM | POA: Diagnosis not present

## 2021-06-07 DIAGNOSIS — I10 Essential (primary) hypertension: Secondary | ICD-10-CM | POA: Diagnosis not present

## 2021-06-07 DIAGNOSIS — S72002K Fracture of unspecified part of neck of left femur, subsequent encounter for closed fracture with nonunion: Secondary | ICD-10-CM | POA: Diagnosis not present

## 2021-06-07 DIAGNOSIS — E782 Mixed hyperlipidemia: Secondary | ICD-10-CM | POA: Diagnosis not present

## 2021-06-07 DIAGNOSIS — E559 Vitamin D deficiency, unspecified: Secondary | ICD-10-CM | POA: Insufficient documentation

## 2021-06-07 DIAGNOSIS — D62 Acute posthemorrhagic anemia: Secondary | ICD-10-CM | POA: Insufficient documentation

## 2021-06-07 DIAGNOSIS — M62838 Other muscle spasm: Secondary | ICD-10-CM | POA: Insufficient documentation

## 2021-06-07 DIAGNOSIS — K5900 Constipation, unspecified: Secondary | ICD-10-CM | POA: Insufficient documentation

## 2021-06-07 DIAGNOSIS — E871 Hypo-osmolality and hyponatremia: Secondary | ICD-10-CM | POA: Diagnosis not present

## 2021-06-07 DIAGNOSIS — M6281 Muscle weakness (generalized): Secondary | ICD-10-CM | POA: Diagnosis not present

## 2021-06-07 DIAGNOSIS — Z7401 Bed confinement status: Secondary | ICD-10-CM | POA: Diagnosis not present

## 2021-06-07 DIAGNOSIS — Z96642 Presence of left artificial hip joint: Secondary | ICD-10-CM | POA: Insufficient documentation

## 2021-06-07 DIAGNOSIS — G510 Bell's palsy: Secondary | ICD-10-CM | POA: Diagnosis not present

## 2021-06-07 DIAGNOSIS — F411 Generalized anxiety disorder: Secondary | ICD-10-CM | POA: Diagnosis not present

## 2021-06-07 LAB — SURGICAL PATHOLOGY

## 2021-06-07 MED ORDER — SODIUM CHLORIDE 1 G PO TABS: 1.0000 g | ORAL_TABLET | Freq: Two times a day (BID) | ORAL | 0 refills | Status: AC

## 2021-06-07 MED ORDER — ASPIRIN 81 MG PO CHEW
81.0000 mg | CHEWABLE_TABLET | Freq: Two times a day (BID) | ORAL | 0 refills | Status: AC
Start: 2021-06-07 — End: ?

## 2021-06-07 MED ORDER — HYDROCODONE-ACETAMINOPHEN 5-325 MG PO TABS: 1.0000 | ORAL_TABLET | ORAL | 0 refills | Status: DC | PRN

## 2021-06-07 MED ORDER — DOCUSATE SODIUM 100 MG PO CAPS: 100.0000 mg | ORAL_CAPSULE | Freq: Two times a day (BID) | ORAL | 0 refills | Status: AC

## 2021-06-07 MED ORDER — METHOCARBAMOL 500 MG PO TABS: 500.0000 mg | ORAL_TABLET | Freq: Four times a day (QID) | ORAL | 0 refills | Status: DC | PRN

## 2021-06-07 NOTE — Progress Notes (Signed)
Physical Therapy Treatment Patient Details Name: Sandra [name] MRN: [id number] DOB: 03/30/1940 Today's Date: 06/07/2021   History of Present Illness Pt admitted for L fem neck fx secondary to fall and is s/p L hip hemi on 06/03/21. HIstory includes anxiety and HTN    PT Comments    Pt ready for session.  To EOB with rails and time but no physical assist today.  Stood and is able to walk to bathroom to void, perform ind self care then is encouraged to walk into hallway today.  She does walk out across hall but self initiates return to room.  Encouraged and agrees to stay up in chair after session with needs are in reach.   Recommendations for follow up therapy are one component of a multi-disciplinary discharge planning process, led by the attending physician.  Recommendations may be updated based on patient status, additional functional criteria and insurance authorization.  Follow Up Recommendations  Skilled nursing-short term rehab (<3 hours/day)     Assistance Recommended at Discharge Intermittent Supervision/Assistance  Patient can return home with the following Assistance with cooking/housework;Assist for transportation;Help with stairs or ramp for entrance;A little help with walking and/or transfers;A little help with bathing/dressing/bathroom   Equipment Recommendations  Rolling walker (2 wheels);BSC/3in1    Recommendations for Other Services       Precautions / Restrictions Precautions Precautions: Fall;Anterior Hip Precaution Booklet Issued: No Restrictions Weight Bearing Restrictions: Yes LLE Weight Bearing: Weight bearing as tolerated     Mobility  Bed Mobility Overal bed mobility: Needs Assistance Bed Mobility: Supine to Sit     Supine to sit: Min guard, HOB elevated     General bed mobility comments: increased time and use of rails but does well    Transfers Overall transfer level: Needs assistance Equipment used: Rolling walker (2 wheels) Transfers:  Sit to/from Stand Sit to Stand: Min guard, Min assist                Ambulation/Gait Ambulation/Gait assistance: Min guard, Min assist Gait Distance (Feet): 40 Feet Assistive device: Rolling walker (2 wheels) Gait Pattern/deviations: Step-to pattern, Antalgic, Trunk flexed Gait velocity: decreased     General Gait Details: less antalgic today and overall improved quality but pt does still self limit and needs enocuragement to walk outside of door.   Stairs             Wheelchair Mobility    Modified Rankin (Stroke Patients Only)       Balance Overall balance assessment: Needs assistance Sitting-balance support: Feet supported Sitting balance-Leahy Scale: Good     Standing balance support: During functional activity, Bilateral upper extremity supported Standing balance-Leahy Scale: Fair                              Cognition Arousal/Alertness: Awake/alert Behavior During Therapy: WFL for tasks assessed/performed Overall Cognitive Status: Within Functional Limits for tasks assessed                                          Exercises Other Exercises Other Exercises: to bathroom to void    General Comments        Pertinent Vitals/Pain Pain Assessment Pain Assessment: Faces Faces Pain Scale: Hurts a little bit Pain Location: L hip Pain Descriptors / Indicators: Operative site guarding Pain Intervention(s): Limited activity within patient's  tolerance, Monitored during session, Repositioned    Home Living                          Prior Function            PT Goals (current goals can now be found in the care plan section) Progress towards PT goals: Progressing toward goals    Frequency    BID      PT Plan Current plan remains appropriate    Co-evaluation              AM-PAC PT "6 Clicks" Mobility   Outcome Measure  Help needed turning from your back to your side while in a flat bed  without using bedrails?: A Little Help needed moving from lying on your back to sitting on the side of a flat bed without using bedrails?: A Little Help needed moving to and from a bed to a chair (including a wheelchair)?: A Little Help needed standing up from a chair using your arms (e.g., wheelchair or bedside chair)?: A Little Help needed to walk in hospital room?: A Little Help needed climbing 3-5 steps with a railing? : A Lot 6 Click Score: 17    End of Session Equipment Utilized During Treatment: Gait belt Activity Tolerance: Patient tolerated treatment well Patient left: in bed;with bed alarm set;with SCD's reapplied Nurse Communication: Mobility status PT Visit Diagnosis: Difficulty in walking, not elsewhere classified (R26.2);History of falling (Z91.81);Pain Pain - Right/Left: Left Pain - part of body: Hip     Time: 0942-1000 PT Time Calculation (min) (ACUTE ONLY): 18 min  Charges:  $Gait Training: 8-22 mins                   Danielle Dess, PTA 06/07/21, 10:18 AM

## 2021-06-07 NOTE — Discharge Summary (Signed)
Physician Discharge Summary   Patient: Sandra [name] MRN: [id number] DOB: [birth year]  Admit date:     06/02/2021  Discharge date: 06/07/21  Discharge Physician: Sharen Hones   PCP: Steele Sizer, MD   Recommendations at discharge:   Follow with PCP in 1 week. Follow-up up with orthopedics in 2 weeks.  Discharge Diagnoses: Principal Problem:   Femoral neck fracture (HCC) Active Problems:   Hypertension, benign   Hyponatremia   Acute postoperative anemia due to expected blood loss  Resolved Problems:   * No resolved hospital problems. [facility] Course: Sandra [name] is a 81 y.o. female with medical history significant for hypertension, anxiety disorder, hiatal hernia who presents to the ER via EMS from an assisted living facility after she had a witnessed ground-level mechanical fall.  He sustained a left femoral neck fracture, left hip hemiarthroplasty performed on 5/18.  Assessment and Plan: Left femoral neck fracture status post surgery. Acute blood loss anemia. Patient has a better appetite, able to walk with physical therapy.  Hemoglobin stable.  Patient will take aspirin 81 mg twice a day for 30 days for prophylaxis per recommendation from orthopedics  Hyponatremia secondary to SIADH. Sodium level is better at 131, continue salt tablets.   Essential hypertension. Resume home treatment       Consultants: orthopdics Procedures performed: Left hip hemiarthroplasty. Disposition: Skilled nursing facility Diet recommendation:  Discharge Diet Orders (From admission, onward)     Start     Ordered   06/07/21 0000  Diet general       Comments: Low fat diet, no restriction on salt   06/07/21 1028           Cardiac diet DISCHARGE MEDICATION: Allergies as of 06/07/2021   No Known Allergies      Medication List     TAKE these medications    aspirin 81 MG chewable tablet Chew 1 tablet (81 mg total) by mouth 2 (two) times daily.   docusate  sodium 100 MG capsule Commonly known as: COLACE Take 1 capsule (100 mg total) by mouth 2 (two) times daily.   HYDROcodone-acetaminophen 5-325 MG tablet Commonly known as: NORCO/VICODIN Take 1 tablet by mouth every 4 (four) hours as needed for moderate pain.   lisinopril 2.5 MG tablet Commonly known as: ZESTRIL Take 1 tablet (2.5 mg total) by mouth daily.   methocarbamol 500 MG tablet Commonly known as: ROBAXIN Take 1 tablet (500 mg total) by mouth every 6 (six) hours as needed for muscle spasms.   multivitamin tablet Take 1 tablet by mouth daily.   sodium chloride 1 g tablet Take 1 tablet (1 g total) by mouth 2 (two) times daily with a meal for 20 days.   Vitamin D3 50 MCG (2000 UT) capsule Take 1 capsule (2,000 Units total) by mouth daily.               Discharge Care Instructions  (From admission, onward)           Start     Ordered   06/07/21 0000  Discharge wound care:       Comments: Follow with orthopedics   06/07/21 1028            Contact information for follow-up providers     Lovell Sheehan, MD Follow up in 2 week(s).   Specialty: Orthopedic Surgery Why: Make appt for follow up prior to discharge, For wound re-check Contact information: [place] [address]  [id number]         Steele Sizer, MD Follow up in 1 week(s).   Specialty: Family Medicine Contact information: [address] [place] [address] [phone]              Contact information for after-discharge care     [facility] .   Service: Skilled Nursing Contact information: [place] [phone]                    Discharge Exam: Danley Danker Weights   06/02/21 1102 06/03/21 1502  Weight: 62.1 kg 62.1 kg   General exam: Appears calm and comfortable  Respiratory  system: Clear to auscultation. Respiratory effort normal. Cardiovascular system: S1 & S2 heard, RRR. No JVD, murmurs, rubs, gallops or clicks. No pedal edema. Gastrointestinal system: Abdomen is nondistended, soft and nontender. No organomegaly or masses felt. Normal bowel sounds heard. Central nervous system: Alert and oriented. No focal neurological deficits. Extremities: Symmetric 5 x 5 power. Skin: No rashes, lesions or ulcers Psychiatry: Judgement and insight appear normal. Mood & affect appropriate.    Condition at discharge: good  The results of significant diagnostics from this hospitalization (including imaging, microbiology, ancillary and laboratory) are listed below for reference.   Imaging Studies: DG Chest 1 View  Result Date: 06/02/2021 CLINICAL DATA:  Provided history: Fall with left hip pain. Mechanical fall with left hip pain. EXAM: CHEST  1 VIEW COMPARISON:  Prior chest radiographs 08/19/2018 and earlier. FINDINGS: Heart size within normal limits. No appreciable airspace consolidation. No evidence of pleural effusion or pneumothorax. No acute bony abnormality identified. Upper thoracic dextrocurvature. Degenerative changes of the spine. IMPRESSION: No evidence of acute cardiopulmonary abnormality. Upper thoracic dextrocurvature Degenerative changes of the spine. Electronically Signed   By: Kellie Simmering D.O.   On: 06/02/2021 11:55   MR ANGIO HEAD WO CONTRAST  Result Date: 05/24/2021 CLINICAL DATA:  Blurry vision, stroke suspected EXAM: MRI HEAD WITHOUT CONTRAST MRA HEAD WITHOUT CONTRAST TECHNIQUE: Multiplanar, multi-echo pulse sequences of the brain and surrounding structures were acquired without intravenous contrast. Angiographic images of the Circle of Willis were acquired using MRA technique without intravenous contrast. COMPARISON:  None Available. FINDINGS: MRI HEAD FINDINGS Brain: No restricted diffusion to suggest acute or subacute infarct. No acute hemorrhage, mass, mass  effect, or midline shift. No hydrocephalus or extra-axial collection. No hemosiderin deposition to suggest remote hemorrhage. T2 hyperintense signal in the periventricular white matter, likely the sequela of moderate chronic small vessel ischemic disease. Vascular: Normal flow voids. Skull and upper cervical spine: Normal marrow signal. Sinuses/Orbits: No acute or significant finding. Other: Fluid in left mastoid air cells. MRA HEAD FINDINGS Anterior circulation: Both internal carotid arteries are patent to the termini, without significant stenosis. A1 segments patent, with an elongated right A1 course. Anterior cerebral arteries are patent to their distal aspects. No M1 stenosis or occlusion. Normal MCA bifurcations. Suspect moderate narrowing in the inferior right M2 branch (series 9, image 103). Distal MCA branches otherwise perfused and symmetric. Posterior circulation: Vertebral arteries patent to the vertebrobasilar junction without stenosis. Posterior inferior cerebral arteries patent bilaterally, although the proximal right PICA is poorly visualized. Basilar patent to its distal aspect. Superior cerebellar arteries patent bilaterally. Fetal origin of the right PCA, from a patent right posterior communicating artery. Patent left P1. PCAs perfused to their distal aspects without stenosis.  The left posterior communicating artery is patent. Anatomic variants: Fetal origin of the right PCA. IMPRESSION: 1. No acute intracranial process. No evidence of acute or subacute infarct. 2. No intracranial large vessel occlusion or severe stenosis. Moderate narrowing is suspected in the inferior right M2 branch, and there is poor signal in the right PICA, likely indicating poor flow without focal stenosis. Electronically Signed   By: Merilyn Baba M.D.   On: 05/24/2021 18:13   MR BRAIN WO CONTRAST  Result Date: 05/24/2021 CLINICAL DATA:  Blurry vision, stroke suspected EXAM: MRI HEAD WITHOUT CONTRAST MRA HEAD WITHOUT  CONTRAST TECHNIQUE: Multiplanar, multi-echo pulse sequences of the brain and surrounding structures were acquired without intravenous contrast. Angiographic images of the Circle of Willis were acquired using MRA technique without intravenous contrast. COMPARISON:  None Available. FINDINGS: MRI HEAD FINDINGS Brain: No restricted diffusion to suggest acute or subacute infarct. No acute hemorrhage, mass, mass effect, or midline shift. No hydrocephalus or extra-axial collection. No hemosiderin deposition to suggest remote hemorrhage. T2 hyperintense signal in the periventricular white matter, likely the sequela of moderate chronic small vessel ischemic disease. Vascular: Normal flow voids. Skull and upper cervical spine: Normal marrow signal. Sinuses/Orbits: No acute or significant finding. Other: Fluid in left mastoid air cells. MRA HEAD FINDINGS Anterior circulation: Both internal carotid arteries are patent to the termini, without significant stenosis. A1 segments patent, with an elongated right A1 course. Anterior cerebral arteries are patent to their distal aspects. No M1 stenosis or occlusion. Normal MCA bifurcations. Suspect moderate narrowing in the inferior right M2 branch (series 9, image 103). Distal MCA branches otherwise perfused and symmetric. Posterior circulation: Vertebral arteries patent to the vertebrobasilar junction without stenosis. Posterior inferior cerebral arteries patent bilaterally, although the proximal right PICA is poorly visualized. Basilar patent to its distal aspect. Superior cerebellar arteries patent bilaterally. Fetal origin of the right PCA, from a patent right posterior communicating artery. Patent left P1. PCAs perfused to their distal aspects without stenosis. The left posterior communicating artery is patent. Anatomic variants: Fetal origin of the right PCA. IMPRESSION: 1. No acute intracranial process. No evidence of acute or subacute infarct. 2. No intracranial large vessel  occlusion or severe stenosis. Moderate narrowing is suspected in the inferior right M2 branch, and there is poor signal in the right PICA, likely indicating poor flow without focal stenosis. Electronically Signed   By: Merilyn Baba M.D.   On: 05/24/2021 18:13   DG Pelvis Portable  Result Date: 06/03/2021 CLINICAL DATA:  Subcapital left femoral neck fracture EXAM: PORTABLE PELVIS 1 VIEWS COMPARISON:  Film from the previous day. FINDINGS: Pelvic ring is intact. New left replacement is noted in satisfactory position. Postsurgical changes in the proximal right femur are stable. No other focal abnormality is noted. IMPRESSION: Status post left hip replacement. Electronically Signed   By: Inez Catalina M.D.   On: 06/03/2021 18:57   DG Hip Unilat With Pelvis 2-3 Views Left  Result Date: 06/02/2021 CLINICAL DATA:  Fall.  Left hip pain. EXAM: DG HIP (WITH OR WITHOUT PELVIS) 2-3V LEFT COMPARISON:  None Available. FINDINGS: Displaced left femoral neck fracture is seen. No evidence of hip dislocation. No acute pelvic fracture identified. Internal fixation hardware noted in the right hip. Generalized osteopenia also demonstrated. IMPRESSION: Displaced left femoral neck fracture. Electronically Signed   By: Marlaine Hind M.D.   On: 06/02/2021 11:56    Microbiology: Results for orders placed or performed during the hospital encounter of 08/19/18  SARS Coronavirus 2 (  Hospital order, Performed in [facility] hospital lab) Nasopharyngeal Nasopharyngeal Swab     Status: None   Collection Time: 08/19/18  5:06 AM   Specimen: Nasopharyngeal Swab  Result Value Ref Range Status   SARS Coronavirus 2 NEGATIVE NEGATIVE Final    Comment: (NOTE) If result is NEGATIVE SARS-CoV-2 target nucleic acids are NOT DETECTED. The SARS-CoV-2 RNA is generally detectable in upper and lower  respiratory specimens during the acute phase of infection. The lowest  concentration of SARS-CoV-2 viral copies this assay can detect is 250   copies / mL. A negative result does not preclude SARS-CoV-2 infection  and should not be used as the sole basis for treatment or other  patient management decisions.  A negative result may occur with  improper specimen collection / handling, submission of specimen other  than nasopharyngeal swab, presence of viral mutation(s) within the  areas targeted by this assay, and inadequate number of viral copies  (<250 copies / mL). A negative result must be combined with clinical  observations, patient history, and epidemiological information. If result is POSITIVE SARS-CoV-2 target nucleic acids are DETECTED. The SARS-CoV-2 RNA is generally detectable in upper and lower  respiratory specimens dur ing the acute phase of infection.  Positive  results are indicative of active infection with SARS-CoV-2.  Clinical  correlation with patient history and other diagnostic information is  necessary to determine patient infection status.  Positive results do  not rule out bacterial infection or co-infection with other viruses. If result is PRESUMPTIVE POSTIVE SARS-CoV-2 nucleic acids MAY BE PRESENT.   A presumptive positive result was obtained on the submitted specimen  and confirmed on repeat testing.  While 2019 novel coronavirus  (SARS-CoV-2) nucleic acids may be present in the submitted sample  additional confirmatory testing may be necessary for epidemiological  and / or clinical management purposes  to differentiate between  SARS-CoV-2 and other Sarbecovirus currently known to infect humans.  If clinically indicated additional testing with an alternate test  methodology [phone]) is advised. The SARS-CoV-2 RNA is generally  detectable in upper and lower respiratory sp ecimens during the acute  phase of infection. The expected result is Negative. Fact Sheet for Patients:  StrictlyIdeas.no Fact Sheet for Healthcare  Providers: BankingDealers.co.za This test is not yet approved or cleared by the [place] FDA and has been authorized for detection and/or diagnosis of SARS-CoV-2 by FDA under an Emergency Use Authorization (EUA).  This EUA will remain in effect (meaning this test can be used) for the duration of the COVID-19 declaration under Section 564(b)(1) of the Act, 21 U.S.C. section 360bbb-3(b)(1), unless the authorization is terminated or revoked sooner. Performed at [facility], [address], [place], [place] [address]   Surgical PCR screen     Status: None   Collection Time: 08/19/18 10:51 AM   Specimen: Nasal Mucosa; Nasal Swab  Result Value Ref Range Status   MRSA, PCR NEGATIVE NEGATIVE Final   Staphylococcus aureus NEGATIVE NEGATIVE Final    Comment: (NOTE) The Xpert SA Assay (FDA approved for NASAL specimens in patients 64 years of age and older), is one component of a comprehensive surveillance program. It is not intended to diagnose infection nor to guide or monitor treatment. Performed at [facility], [address] [place], [place] [address]   SARS Coronavirus 2 [facility] order, Performed in [facility] hospital lab)     Status: None   Collection Time: 08/24/18  1:00 PM  Result Value Ref Range  Status   SARS Coronavirus 2 NEGATIVE NEGATIVE Final    Comment: (NOTE) If result is NEGATIVE SARS-CoV-2 target nucleic acids are NOT DETECTED. The SARS-CoV-2 RNA is generally detectable in upper and lower  respiratory specimens during the acute phase of infection. The lowest  concentration of SARS-CoV-2 viral copies this assay can detect is 250  copies / mL. A negative result does not preclude SARS-CoV-2 infection  and should not be used as the sole basis for treatment or other  patient management decisions.  A negative result may occur with  improper specimen collection / handling, submission of specimen other  than nasopharyngeal  swab, presence of viral mutation(s) within the  areas targeted by this assay, and inadequate number of viral copies  (<250 copies / mL). A negative result must be combined with clinical  observations, patient history, and epidemiological information. If result is POSITIVE SARS-CoV-2 target nucleic acids are DETECTED. The SARS-CoV-2 RNA is generally detectable in upper and lower  respiratory specimens dur ing the acute phase of infection.  Positive  results are indicative of active infection with SARS-CoV-2.  Clinical  correlation with patient history and other diagnostic information is  necessary to determine patient infection status.  Positive results do  not rule out bacterial infection or co-infection with other viruses. If result is PRESUMPTIVE POSTIVE SARS-CoV-2 nucleic acids MAY BE PRESENT.   A presumptive positive result was obtained on the submitted specimen  and confirmed on repeat testing.  While 2019 novel coronavirus  (SARS-CoV-2) nucleic acids may be present in the submitted sample  additional confirmatory testing may be necessary for epidemiological  and / or clinical management purposes  to differentiate between  SARS-CoV-2 and other Sarbecovirus currently known to infect humans.  If clinically indicated additional testing with an alternate test  methodology [phone]) is advised. The SARS-CoV-2 RNA is generally  detectable in upper and lower respiratory sp ecimens during the acute  phase of infection. The expected result is Negative. Fact Sheet for Patients:  StrictlyIdeas.no Fact Sheet for Healthcare Providers: BankingDealers.co.za This test is not yet approved or cleared by the [place] FDA and has been authorized for detection and/or diagnosis of SARS-CoV-2 by FDA under an Emergency Use Authorization (EUA).  This EUA will remain in effect (meaning this test can be used) for the duration of the COVID-19 declaration  under Section 564(b)(1) of the Act, 21 U.S.C. section 360bbb-3(b)(1), unless the authorization is terminated or revoked sooner. Performed at [facility], [address] [place], [place] [address]     Labs: CBC: Recent Labs  Lab 06/02/21 1104 06/03/21 0347 06/04/21 0540 06/05/21 0514 06/06/21 0506  WBC 7.1 8.5 12.8* 10.6* 8.2  HGB 12.8 11.6* 11.2* 9.5* 8.9*  HCT 37.5 33.8* 31.5* 28.0* 26.2*  MCV 90.8 90.4 90.3 90.6 93.2  PLT 234 186 157 156 [id number]   Basic Metabolic Panel: Recent Labs  Lab 06/02/21 1104 06/03/21 0347 06/04/21 0540 06/05/21 0514 06/06/21 0506  NA 132* 130* 128* 129* 131*  K 3.9 4.0 3.9 4.3 3.9  CL 98 98 96* 98 101  CO2 25 26 22 26 25   GLUCOSE 112* 115* 141* 102* 90  BUN 16 22 22  24* 20  CREATININE 0.85 0.87 0.94 0.77 0.69  CALCIUM 9.5 9.1 8.3* 8.2* 7.9*  MG  --   --  2.0 2.2  --    Liver Function Tests: Recent Labs  Lab 06/02/21 1104  AST 21  ALT 14  ALKPHOS 55  BILITOT 0.8  PROT 7.6  ALBUMIN 4.2   CBG: No results for input(s): GLUCAP in the last 168 hours.  Discharge time spent: less than 30 minutes.  Signed: Sharen Hones, MD [facility] 06/07/2021

## 2021-06-07 NOTE — Discharge Instructions (Signed)

## 2021-06-07 NOTE — Progress Notes (Signed)
EMS here to transport, Dentures and glasses sent with pt. Daughter Pam notified.

## 2021-06-07 NOTE — Progress Notes (Signed)
  Subjective:  Patient reports pain as mild to moderate.    Objective:   VITALS:   Vitals:   06/06/21 1619 06/06/21 2011 06/06/21 2319 06/07/21 0516  BP: (!) 119/46 (!) 124/51 127/60 126/60  Pulse: 92 92 94 83  Resp: 16 17 17 16   Temp: 98.9 F (37.2 C) 98.4 F (36.9 C) 98.5 F (36.9 C) 98.1 F (36.7 C)  TempSrc:      SpO2: 100% 100% 99% 100%  Weight:      Height:        PHYSICAL EXAM:  Neurologically intact ABD soft Neurovascular intact Sensation intact distally Intact pulses distally Dorsiflexion/Plantar flexion intact Incision: dressing C/D/I No cellulitis present Compartment soft  LABS  No results found for this or any previous visit (from the past 24 hour(s)).  No results found.  Assessment/Plan: 4 Days Post-Op   Principal Problem:   Femoral neck fracture (HCC) Active Problems:   Hypertension, benign   Hyponatremia   Acute postoperative anemia due to expected blood loss   Advance diet Up with therapy WBAT LLE Continue aspirin 81 mg BID X 30 days Continue hydrocodone as needed for pain (rx in chart) Follow up on June 18, 2021 for staple removal with Carlynn Spry PA-C.  Call office to confirm [phone] Okay to discharge from ortho standpoint Patient states she prefers [facility] for SNF   Carlynn Spry , PA-C 06/07/2021, 8:16 AM

## 2021-06-07 NOTE — Progress Notes (Signed)
Report called to [place], RN @ [facility], Pt waiting on EMS for transportation.

## 2021-06-07 NOTE — Care Management Important Message (Signed)
Important Message  Patient Details  Name: Sandra [name] MRN: [id number] Date of Birth: 02-05-1940   Medicare Important Message Given:  Yes     Olegario Messier A Avy Barlett 06/07/2021, 11:14 AM

## 2021-06-07 NOTE — TOC Progression Note (Signed)
Transition of Care [facility]) - Progression Note    Patient Details  Name: Sandra [name] MRN: [id number] Date of Birth: 1940-12-21  [facility]) CM/SW Contact  Marlowe Sax, RN Phone Number: 06/07/2021, 10:56 AM  Clinical Narrative:     Going to room 507 at [facility], called daughter Rinaldo Cloud  and notified her that the patient will go to  room 507 EMS  called   Expected Discharge Plan: Skilled Nursing Facility Barriers to Discharge: Continued Medical Work up, SNF Pending bed offer, Insurance Authorization  Expected Discharge Plan and Services Expected Discharge Plan: Skilled Nursing Facility       Living arrangements for the past 2 months: Assisted Living Facility Expected Discharge Date: 06/07/21                                     Social Determinants of Health (SDOH) Interventions    Readmission Risk Interventions     View : No data to display.

## 2021-06-09 ENCOUNTER — Telehealth: Payer: Self-pay

## 2021-06-09 NOTE — Telephone Encounter (Signed)
Transition Care Management Follow-up Telephone Call Date of discharge and from where: [facility] 5/22 How have you been since you were released from the hospital? Getting along good Any questions or concerns? No  Items Reviewed: Did the pt receive and understand the discharge instructions provided? Yes  Medications obtained and verified? Yes  Other? No  Any new allergies since your discharge? No  Dietary orders reviewed? No Do you have support at home? Yes   Home Care and Equipment/Supplies: Were home health services ordered? no  Functional Questionnaire: (I = Independent and D = Dependent) ADLs: I  Bathing/Dressing- I  Meal Prep- I  Eating- I  Maintaining continence- I  Transferring/Ambulation- I  Managing Meds- I  Follow up appointments reviewed:  PCP Hospital f/u appt confirmed? Yes  Scheduled to see Dr.Sowles on 6/28 for a CPE, pt didn't want to be seen before then. Are transportation arrangements needed? No  If their condition worsens, is the pt aware to call PCP or go to the Emergency Dept.? Yes Was the patient provided with contact information for the PCP's office or ED? Yes Was to pt encouraged to call back with questions or concerns? Yes

## 2021-06-11 DIAGNOSIS — S72002D Fracture of unspecified part of neck of left femur, subsequent encounter for closed fracture with routine healing: Secondary | ICD-10-CM | POA: Diagnosis not present

## 2021-06-11 DIAGNOSIS — D62 Acute posthemorrhagic anemia: Secondary | ICD-10-CM | POA: Diagnosis not present

## 2021-06-11 DIAGNOSIS — I10 Essential (primary) hypertension: Secondary | ICD-10-CM | POA: Diagnosis not present

## 2021-06-11 DIAGNOSIS — M6281 Muscle weakness (generalized): Secondary | ICD-10-CM | POA: Diagnosis not present

## 2021-06-11 DIAGNOSIS — K5904 Chronic idiopathic constipation: Secondary | ICD-10-CM | POA: Diagnosis not present

## 2021-06-11 DIAGNOSIS — E871 Hypo-osmolality and hyponatremia: Secondary | ICD-10-CM | POA: Diagnosis not present

## 2021-06-14 DIAGNOSIS — M6281 Muscle weakness (generalized): Secondary | ICD-10-CM | POA: Diagnosis not present

## 2021-06-14 DIAGNOSIS — E871 Hypo-osmolality and hyponatremia: Secondary | ICD-10-CM | POA: Diagnosis not present

## 2021-06-14 DIAGNOSIS — D62 Acute posthemorrhagic anemia: Secondary | ICD-10-CM | POA: Diagnosis not present

## 2021-06-14 DIAGNOSIS — I10 Essential (primary) hypertension: Secondary | ICD-10-CM | POA: Diagnosis not present

## 2021-06-14 DIAGNOSIS — S72002D Fracture of unspecified part of neck of left femur, subsequent encounter for closed fracture with routine healing: Secondary | ICD-10-CM | POA: Diagnosis not present

## 2021-06-15 ENCOUNTER — Encounter: Payer: Self-pay | Admitting: Orthopedic Surgery

## 2021-06-17 DIAGNOSIS — I10 Essential (primary) hypertension: Secondary | ICD-10-CM | POA: Diagnosis not present

## 2021-06-17 DIAGNOSIS — G510 Bell's palsy: Secondary | ICD-10-CM | POA: Diagnosis not present

## 2021-06-17 DIAGNOSIS — R296 Repeated falls: Secondary | ICD-10-CM | POA: Diagnosis not present

## 2021-06-17 DIAGNOSIS — S72002D Fracture of unspecified part of neck of left femur, subsequent encounter for closed fracture with routine healing: Secondary | ICD-10-CM | POA: Diagnosis not present

## 2021-06-17 DIAGNOSIS — D62 Acute posthemorrhagic anemia: Secondary | ICD-10-CM | POA: Diagnosis not present

## 2021-06-17 DIAGNOSIS — E871 Hypo-osmolality and hyponatremia: Secondary | ICD-10-CM | POA: Diagnosis not present

## 2021-06-17 DIAGNOSIS — M6281 Muscle weakness (generalized): Secondary | ICD-10-CM | POA: Diagnosis not present

## 2021-06-17 DIAGNOSIS — M62838 Other muscle spasm: Secondary | ICD-10-CM | POA: Diagnosis not present

## 2021-06-17 DIAGNOSIS — K5904 Chronic idiopathic constipation: Secondary | ICD-10-CM | POA: Diagnosis not present

## 2021-06-18 ENCOUNTER — Telehealth: Payer: Self-pay | Admitting: Family Medicine

## 2021-06-18 DIAGNOSIS — I10 Essential (primary) hypertension: Secondary | ICD-10-CM | POA: Diagnosis not present

## 2021-06-18 DIAGNOSIS — E871 Hypo-osmolality and hyponatremia: Secondary | ICD-10-CM | POA: Diagnosis not present

## 2021-06-18 DIAGNOSIS — M6281 Muscle weakness (generalized): Secondary | ICD-10-CM | POA: Diagnosis not present

## 2021-06-18 DIAGNOSIS — S72002D Fracture of unspecified part of neck of left femur, subsequent encounter for closed fracture with routine healing: Secondary | ICD-10-CM | POA: Diagnosis not present

## 2021-06-18 DIAGNOSIS — K5904 Chronic idiopathic constipation: Secondary | ICD-10-CM | POA: Diagnosis not present

## 2021-06-18 DIAGNOSIS — D62 Acute posthemorrhagic anemia: Secondary | ICD-10-CM | POA: Diagnosis not present

## 2021-06-18 NOTE — Telephone Encounter (Signed)
Copied from CRM [id number]. Topic: General - Other >> Jun 18, 2021  3:24 PM Maye Hides wrote: Reason for CRM: Adelina Mings from [facility] home health called stating that pt is being release form [facility] today for fracture of femur.She is needing a verbal approval for PT,OT and Nursing

## 2021-06-21 ENCOUNTER — Telehealth: Payer: Self-pay

## 2021-06-21 DIAGNOSIS — Z96642 Presence of left artificial hip joint: Secondary | ICD-10-CM | POA: Diagnosis not present

## 2021-06-21 NOTE — Telephone Encounter (Signed)
Pt does not want to come in before 08/16/21    Copied from CRM #[id number]. Topic: General - Other >> Jun 21, 2021  3:25 PM Gaetana Michaelis A wrote: Reason for CRM: Dewana with [facility] has called to notify the practice of a delay of care   Skilled nursing, physical therapy and occupational therapy have been rescheduled for 06/30/21  The delay was per patient request   Please contact further if needed

## 2021-06-21 NOTE — Telephone Encounter (Signed)
Copied from CRM [id number]. Topic: Appointment Scheduling - Scheduling Inquiry for Clinic >> Jun 21, 2021  2:46 PM Sandra [name] A wrote: Reason for CRM: The patient would like to speak with Dr. Carlynn Purl or Dr Carlynn Purl' nurse regarding rescheduling their appointment   Please contact further when available   The agent offered to reschedule the patient at the time of call and the patient declined

## 2021-06-22 NOTE — Telephone Encounter (Signed)
Pt cancelled her 07/14/21 appt and kept 08/16/21 appt that she had scheduled

## 2021-06-23 ENCOUNTER — Encounter: Payer: Self-pay | Admitting: Orthopedic Surgery

## 2021-06-24 DIAGNOSIS — H2513 Age-related nuclear cataract, bilateral: Secondary | ICD-10-CM | POA: Diagnosis not present

## 2021-06-24 DIAGNOSIS — D3132 Benign neoplasm of left choroid: Secondary | ICD-10-CM | POA: Diagnosis not present

## 2021-06-24 DIAGNOSIS — H4901 Third [oculomotor] nerve palsy, right eye: Secondary | ICD-10-CM | POA: Diagnosis not present

## 2021-07-14 ENCOUNTER — Encounter: Payer: Medicare PPO | Admitting: Family Medicine

## 2021-08-10 ENCOUNTER — Telehealth: Payer: Self-pay | Admitting: Family Medicine

## 2021-08-10 NOTE — Telephone Encounter (Signed)
Copied from CRM [id number]. Topic: General - Other >> Aug 10, 2021 10:31 AM Macon Large wrote: Reason for CRM: Pt requests that a nurse that is in the office return her call. Pt did not provide any further information. Cb# [phone]

## 2021-08-10 NOTE — Telephone Encounter (Signed)
Returned call, patient just wanted to know when her next appointment was scheduled for. Information given. Patient had no further questions or concerns to express at this time

## 2021-08-16 ENCOUNTER — Inpatient Hospital Stay: Payer: Medicare PPO | Admitting: Family Medicine

## 2021-08-30 NOTE — Progress Notes (Deleted)
Name: Sandra [name]   MRN: [id number]    DOB: September 22, 1940   Date:08/30/2021       Progress Note  Subjective  Chief Complaint  Hospital Follow-Up  HPI  Admitted: 06/02/21 Discharged: 06/07/21  Patient Active Problem List   Diagnosis Date Noted   Acute postoperative anemia due to expected blood loss 06/05/2021   Femoral neck fracture (HCC) 06/02/2021   History of fracture of right hip 05/14/2019   Dyslipidemia 05/14/2019   Age-related osteoporosis without current pathological fracture 08/27/2018   Hyponatremia 08/27/2018   Hiatal hernia 10/27/2014   Hypertension, benign 10/27/2014   Female pattern hair loss 10/27/2014   Nocturia 10/27/2014   Left-sided Bell's palsy 10/27/2014   Generalized anxiety disorder 10/27/2014    Past Surgical History:  Procedure Laterality Date   ABDOMINAL HYSTERECTOMY     HIP ARTHROPLASTY Left 06/03/2021   Procedure: ARTHROPLASTY BIPOLAR HIP (HEMIARTHROPLASTY);  Surgeon: Lyndle Herrlich, MD;  Location: [facility] ORS;  Service: Orthopedics;  Laterality: Left;   HIP SURGERY  01/22/2019   INTRAMEDULLARY (IM) NAIL INTERTROCHANTERIC Right 08/19/2018   Procedure: INTRAMEDULLARY (IM) NAIL INTERTROCHANTRIC;  Surgeon: Karleen Hampshire, MD;  Location: [facility] ORS;  Service: Orthopedics;  Laterality: Right;   MULTIPLE TOOTH EXTRACTIONS N/A    patient had all of her bottom teeth pulled about 2-3 weeks ago.   TONSILLECTOMY      Family History  Problem Relation Age of Onset   Hypertension Daughter    Hypertension Sister    Hypertension Brother     Social History   Tobacco Use   Smoking status: Never   Smokeless tobacco: Never  Substance Use Topics   Alcohol use: No    Alcohol/week: 0.0 standard drinks of alcohol     Current Outpatient Medications:    aspirin 81 MG chewable tablet, Chew 1 tablet (81 mg total) by mouth 2 (two) times daily., Disp: 60 tablet, Rfl: 0   Cholecalciferol (VITAMIN D3) 50 MCG (2000 UT) capsule, Take 1 capsule (2,000 Units total) by  mouth daily., Disp: 30 capsule, Rfl: 3   docusate sodium (COLACE) 100 MG capsule, Take 1 capsule (100 mg total) by mouth 2 (two) times daily., Disp: 30 capsule, Rfl: 0   HYDROcodone-acetaminophen (NORCO/VICODIN) 5-325 MG tablet, Take 1 tablet by mouth every 4 (four) hours as needed for moderate pain., Disp: 30 tablet, Rfl: 0   lisinopril (ZESTRIL) 2.5 MG tablet, Take 1 tablet (2.5 mg total) by mouth daily., Disp: 90 tablet, Rfl: 3   methocarbamol (ROBAXIN) 500 MG tablet, Take 1 tablet (500 mg total) by mouth every 6 (six) hours as needed for muscle spasms., Disp: 60 tablet, Rfl: 0   Multiple Vitamin (MULTIVITAMIN) tablet, Take 1 tablet by mouth daily., Disp: 30 tablet, Rfl: 3  No Known Allergies  I personally reviewed active problem list, medication list, allergies, family history, social history, health maintenance with the patient/caregiver today.   ROS  ***  Objective  There were no vitals filed for this visit.  There is no height or weight on file to calculate BMI.  Physical Exam ***   PHQ2/9:    05/06/2021   11:49 AM 06/17/2020    2:55 PM 04/21/2020    3:10 PM 11/13/2019    9:15 AM 05/14/2019   10:38 AM  Depression screen PHQ 2/9  Decreased Interest 0 0 0 0 0  Down, Depressed, Hopeless 0 0 1 0 0  PHQ - 2 Score 0 0 1 0 0  Altered sleeping  0 0  Tired, decreased energy    0 0  Change in appetite    0 0  Feeling bad or failure about yourself     0 0  Trouble concentrating    0 0  Moving slowly or fidgety/restless    0 0  Suicidal thoughts    0 0  PHQ-9 Score    0 0    phq 9 is {gen pos [id number]}   Fall Risk:    05/06/2021   11:51 AM 06/17/2020    2:55 PM 04/21/2020    3:12 PM 11/13/2019    9:15 AM 02/14/2019    2:57 PM  Fall Risk   Falls in the past year? 0 0 0 1 1  Number falls in past yr: 0 0 0 0 1  Injury with Fall? 0 0 0 0 1  Risk for fall due to : No Fall Risks  History of fall(s) History of fall(s);Impaired balance/gait Orthopedic patient;Impaired  balance/gait;History of fall(s)  Follow up Falls prevention discussed  Falls prevention discussed  Falls prevention discussed      Functional Status Survey:      Assessment & Plan  *** There are no diagnoses linked to this encounter.

## 2021-08-31 ENCOUNTER — Inpatient Hospital Stay: Payer: Medicare PPO | Admitting: Family Medicine

## 2021-09-06 ENCOUNTER — Other Ambulatory Visit: Payer: Self-pay | Admitting: Family Medicine

## 2021-09-06 DIAGNOSIS — I10 Essential (primary) hypertension: Secondary | ICD-10-CM

## 2021-09-06 NOTE — Telephone Encounter (Signed)
Medication Refill - Medication: lisinopril (ZESTRIL) 2.5 MG tablet  Has the patient contacted their pharmacy? Yes.   (Agent: If no, request that the patient contact the pharmacy for the refill. If patient does not wish to contact the pharmacy document the reason why and proceed with request.) (Agent: If yes, when and what did the pharmacy advise?)  Preferred Pharmacy (with phone number or street name):  [facility] [place], [place] - [address] Phone:  [phone]  Fax:  [phone]     Has the patient been seen for an appointment in the last year OR does the patient have an upcoming appointment? Yes.    Agent: Please be advised that RX refills may take up to 3 business days. We ask that you follow-up with your pharmacy.    Patient stated she will be out of her medication before her next appointment on 09/17/21.

## 2021-09-06 NOTE — Telephone Encounter (Signed)
Medication Refill - Medication: lisinopril (ZESTRIL) 2.5 MG tablet  Has the patient contacted their pharmacy? Yes.   (Agent: If no, request that the patient contact the pharmacy for the refill. If patient does not wish to contact the pharmacy document the reason why and proceed with request.) (Agent: If yes, when and what did the pharmacy advise?)  Preferred Pharmacy (with phone number or street name):  [facility] - [place], [place] - [address] Phone:  [phone]  Fax:  [phone]     Has the patient been seen for an appointment in the last year OR does the patient have an upcoming appointment? Yes.    Agent: Please be advised that RX refills may take up to 3 business days. We ask that you follow-up with your pharmacy.    Patient stated she will be out of her medication before her next appointment on 09/17/21. 

## 2021-09-07 ENCOUNTER — Ambulatory Visit: Payer: Self-pay | Admitting: *Deleted

## 2021-09-07 MED ORDER — LISINOPRIL 2.5 MG PO TABS: 2.5000 mg | ORAL_TABLET | Freq: Every day | ORAL | 0 refills | Status: DC

## 2021-09-07 NOTE — Telephone Encounter (Signed)
Pt given enough refill to last until upcoming appt.  Requested Prescriptions  Pending Prescriptions Disp Refills  . lisinopril (ZESTRIL) 2.5 MG tablet 11 tablet 0    Sig: Take 1 tablet (2.5 mg total) by mouth daily.     Cardiovascular:  ACE Inhibitors Passed - 09/06/2021  5:37 PM      Passed - Cr in normal range and within 180 days    Creat  Date Value Ref Range Status  06/17/2020 0.77 0.60 - 0.88 mg/dL Final    Comment:    For patients >83 years of age, the reference limit for Creatinine is approximately 13% higher for people identified as African-American. .    Creatinine, Ser  Date Value Ref Range Status  06/06/2021 0.69 0.44 - 1.00 mg/dL Final         Passed - K in normal range and within 180 days    Potassium  Date Value Ref Range Status  06/06/2021 3.9 3.5 - 5.1 mmol/L Final         Passed - Patient is not pregnant      Passed - Last BP in normal range    BP Readings from Last 1 Encounters:  06/07/21 (!) 130/53         Passed - Valid encounter within last 6 months    Recent Outpatient Visits          1 year ago Senile purpura [facility])   [facility] Alba Cory, MD   1 year ago Dyslipidemia   [facility] [place] Alba Cory, MD   2 years ago Dyslipidemia   [facility] Alba Cory, MD   2 years ago Hyponatremia   [facility] Alba Cory, MD   3 years ago Hypertension, benign   [facility] D[place], [place]      Future Appointments            In 1 week Alba Cory, MD [facility], [facility]

## 2021-09-07 NOTE — Patient Outreach (Signed)
  Care Coordination   09/07/2021 Name: Sandra [name] MRN: [id number] DOB: 18-Apr-1940   Care Coordination Outreach Attempts:  An unsuccessful telephone outreach was attempted today to offer the patient information about available care coordination services as a benefit of their health plan.   Follow Up Plan:  Additional outreach attempts will be made to offer the patient care coordination information and services.   Encounter Outcome:  No Answer  Care Coordination Interventions Activated:  No   Care Coordination Interventions:  No, not indicated    Mariell Nester, LCSW Clinical Social Worker  [facility] Care Management [phone]

## 2021-09-08 ENCOUNTER — Telehealth: Payer: Self-pay | Admitting: *Deleted

## 2021-09-08 NOTE — Patient Outreach (Signed)
  Care Coordination   09/08/2021 Name: Sandra [name] MRN: [id number] DOB: 12/16/1940   Care Coordination Outreach Attempts:  A second unsuccessful outreach was attempted today to offer the patient with information about available care coordination services as a benefit of their health plan.     Follow Up Plan:  Additional outreach attempts will be made to offer the patient care coordination information and services.   Encounter Outcome:  No Answer  Care Coordination Interventions Activated:  No   Care Coordination Interventions:  No, not indicated    Joscelin Fray, LCSW Clinical Social Worker  [facility] Care Management [phone]

## 2021-09-16 NOTE — Progress Notes (Signed)
Name: Sandra [name]   MRN: [id number]    DOB: 1941-01-05   Date:09/17/2021       Progress Note  Subjective  Chief Complaint  Surgery Clearance  HPI    History of fracture of right hip: it happened in 08/2018 and left hip in 05/2021 , discussed importance of checking bone density and starting therapy, she does not want to take oral medications or injectables so we will refer her to endo once bone density results are back.  We did labs after previous hip fracture and it was normal   Senile purpura: she states still bruises on arms intermittent, stable   HTN: she is down to 2.5 mg of lisinopril daily. No chest pain, palpitation or SOB. She needs a refill    Dyslipidemia: not interested in medications, discussed healthy diet. We will not recheck labs since not interested on therapy     Hyponatremia : last level was  131.  This has been ongoing for a few years and has remained stable.     History of acute right 3rd nerve palsy: seen by ophthalmologist back in 05/2021 and went to [facility] , MRI brain and MR angio head was done and results below, discussed starting her on cholesterol medication, but she is not interested Symptoms resolved   IMPRESSION: 1. No acute intracranial process. No evidence of acute or subacute infarct. 2. No intracranial large vessel occlusion or severe stenosis. Moderate narrowing is suspected in the inferior right M2 branch, and there is poor signal in the right PICA, likely indicating poor flow without focal stenosis.  Patient Active Problem List   Diagnosis Date Noted   Acute postoperative anemia due to expected blood loss 06/05/2021   Femoral neck fracture ([place]) 06/02/2021   History of fracture of right hip 05/14/2019   Dyslipidemia 05/14/2019   Age-related osteoporosis without current pathological fracture 08/27/2018   Hyponatremia 08/27/2018   Hiatal hernia 10/27/2014   Hypertension, benign 10/27/2014   Female pattern hair loss 10/27/2014   Nocturia  10/27/2014   Left-sided Bell's palsy 10/27/2014   Generalized anxiety disorder 10/27/2014    Past Surgical History:  Procedure Laterality Date   ABDOMINAL HYSTERECTOMY     HIP ARTHROPLASTY Left 06/03/2021   Procedure: ARTHROPLASTY BIPOLAR HIP (HEMIARTHROPLASTY);  Surgeon: Lovell Sheehan, MD;  Location: [facility] ORS;  Service: Orthopedics;  Laterality: Left;   HIP SURGERY  01/22/2019   INTRAMEDULLARY (IM) NAIL INTERTROCHANTERIC Right 08/19/2018   Procedure: INTRAMEDULLARY (IM) NAIL INTERTROCHANTRIC;  Surgeon: Nida Boatman, MD;  Location: [facility] ORS;  Service: Orthopedics;  Laterality: Right;   MULTIPLE TOOTH EXTRACTIONS N/A    patient had all of her bottom teeth pulled about 2-3 weeks ago.   TONSILLECTOMY      Family History  Problem Relation Age of Onset   Hypertension Daughter    Hypertension Sister    Hypertension Brother     Social History   Tobacco Use   Smoking status: Never   Smokeless tobacco: Never  Substance Use Topics   Alcohol use: No    Alcohol/week: 0.0 standard drinks of alcohol     Current Outpatient Medications:    aspirin 81 MG chewable tablet, Chew 1 tablet (81 mg total) by mouth 2 (two) times daily., Disp: 60 tablet, Rfl: 0   Cholecalciferol (VITAMIN D3) 50 MCG (2000 UT) capsule, Take 1 capsule (2,000 Units total) by mouth daily., Disp: 30 capsule, Rfl: 3   docusate sodium (COLACE) 100 MG capsule, Take 1 capsule (100 mg  total) by mouth 2 (two) times daily., Disp: 30 capsule, Rfl: 0   HYDROcodone-acetaminophen (NORCO/VICODIN) 5-325 MG tablet, Take 1 tablet by mouth every 4 (four) hours as needed for moderate pain., Disp: 30 tablet, Rfl: 0   lisinopril (ZESTRIL) 2.5 MG tablet, Take 1 tablet (2.5 mg total) by mouth daily., Disp: 11 tablet, Rfl: 0   methocarbamol (ROBAXIN) 500 MG tablet, Take 1 tablet (500 mg total) by mouth every 6 (six) hours as needed for muscle spasms., Disp: 60 tablet, Rfl: 0   Multiple Vitamin (MULTIVITAMIN) tablet, Take 1 tablet by mouth  daily., Disp: 30 tablet, Rfl: 3  No Known Allergies  I personally reviewed active problem list, medication list, allergies, family history, social history, health maintenance with the patient/caregiver today.   ROS  Constitutional: Negative for fever or weight change.  Respiratory: Negative for cough and shortness of breath.   Cardiovascular: Negative for chest pain or palpitations.  Gastrointestinal: Negative for abdominal pain, no bowel changes.  Musculoskeletal: Negative for gait problem or joint swelling.  Skin: Negative for rash.  Neurological: Negative for dizziness or headache.  No other specific complaints in a complete review of systems (except as listed in HPI above).   Objective  Vitals:   09/17/21 1047  BP: 128/70  Pulse: 88  Resp: 18  Temp: 97.8 F (36.6 C)  TempSrc: Oral  SpO2: 100%  Weight: 135 lb 6.4 oz (61.4 kg)  Height: 5\' 1"  (1.549 m)    Body mass index is 25.58 kg/m.  Physical Exam  Constitutional: Patient appears well-developed and well-nourished.  No distress.  HEENT: head atraumatic, normocephalic, pupils equal and reactive to light, neck supple Cardiovascular: Normal rate, regular rhythm and normal heart sounds.  No murmur heard. No BLE edema. Pulmonary/Chest: Effort normal and breath sounds normal. No respiratory distress. Abdominal: Soft.  There is no tenderness. Psychiatric: Patient has a normal mood and affect. behavior is normal. Judgment and thought content normal.  Muscular skeletal: using a cane, slow gait   PHQ2/9:    09/17/2021   10:46 AM 05/06/2021   11:49 AM 06/17/2020    2:55 PM 04/21/2020    3:10 PM 11/13/2019    9:15 AM  Depression screen PHQ 2/9  Decreased Interest 0 0 0 0 0  Down, Depressed, Hopeless 0 0 0 1 0  PHQ - 2 Score 0 0 0 1 0  Altered sleeping 0    0  Tired, decreased energy 0    0  Change in appetite 0    0  Feeling bad or failure about yourself  0    0  Trouble concentrating 0    0  Moving slowly or  fidgety/restless 0    0  Suicidal thoughts 0    0  PHQ-9 Score 0    0  Difficult doing work/chores Not difficult at all        phq 9 is negative   Fall Risk:    09/17/2021   10:46 AM 05/06/2021   11:51 AM 06/17/2020    2:55 PM 04/21/2020    3:12 PM 11/13/2019    9:15 AM  Fall Risk   Falls in the past year? 0 0 0 0 1  Number falls in past yr: 0 0 0 0 0  Injury with Fall? 0 0 0 0 0  Risk for fall due to : No Fall Risks No Fall Risks  History of fall(s) History of fall(s);Impaired balance/gait  Follow up Falls prevention discussed;Education provided Falls  prevention discussed  Falls prevention discussed       Functional Status Survey: Is the patient deaf or have difficulty hearing?: No Does the patient have difficulty seeing, even when wearing glasses/contacts?: No Does the patient have difficulty concentrating, remembering, or making decisions?: No Does the patient have difficulty walking or climbing stairs?: No Does the patient have difficulty dressing or bathing?: No Does the patient have difficulty doing errands alone such as visiting a doctor's office or shopping?: No    Assessment & Plan  1. Senile purpura (HCC)  Reassurance given   2. Dyslipidemia   3. Hypertension, benign  - COMPLETE METABOLIC PANEL WITH GFR - lisinopril (ZESTRIL) 2.5 MG tablet; Take 1 tablet (2.5 mg total) by mouth daily.  Dispense: 90 tablet; Refill: 1  4. Hyponatremia  - COMPLETE METABOLIC PANEL WITH GFR  5. History of fracture of both hips  - DG Bone Density; Future  6. Ovarian failure  - CBC with Differential/Platelet - COMPLETE METABOLIC PANEL WITH GFR - DG Bone Density; Future  7. Acute postoperative anemia due to expected blood loss  - CBC with Differential/Platelet - Iron, TIBC and Ferritin Panel

## 2021-09-17 ENCOUNTER — Encounter: Payer: Self-pay | Admitting: Family Medicine

## 2021-09-17 ENCOUNTER — Ambulatory Visit (INDEPENDENT_AMBULATORY_CARE_PROVIDER_SITE_OTHER): Payer: Medicare PPO | Admitting: Family Medicine

## 2021-09-17 VITALS — BP 128/70 | HR 88 | Temp 97.8°F | Resp 18 | Ht 61.0 in | Wt 135.4 lb

## 2021-09-17 DIAGNOSIS — E2839 Other primary ovarian failure: Secondary | ICD-10-CM

## 2021-09-17 DIAGNOSIS — E785 Hyperlipidemia, unspecified: Secondary | ICD-10-CM | POA: Diagnosis not present

## 2021-09-17 DIAGNOSIS — D692 Other nonthrombocytopenic purpura: Secondary | ICD-10-CM

## 2021-09-17 DIAGNOSIS — E871 Hypo-osmolality and hyponatremia: Secondary | ICD-10-CM | POA: Diagnosis not present

## 2021-09-17 DIAGNOSIS — I10 Essential (primary) hypertension: Secondary | ICD-10-CM

## 2021-09-17 DIAGNOSIS — D62 Acute posthemorrhagic anemia: Secondary | ICD-10-CM

## 2021-09-17 DIAGNOSIS — Z8781 Personal history of (healed) traumatic fracture: Secondary | ICD-10-CM | POA: Insufficient documentation

## 2021-09-17 MED ORDER — LISINOPRIL 2.5 MG PO TABS: 2.5000 mg | ORAL_TABLET | Freq: Every day | ORAL | 1 refills | Status: DC

## 2021-09-18 LAB — IRON,TIBC AND FERRITIN PANEL
%SAT: 26 % (calc) (ref 16–45)
Ferritin: 30 ng/mL (ref 16–288)
Iron: 86 ug/dL (ref 45–160)
TIBC: 337 mcg/dL (calc) (ref 250–450)

## 2021-09-18 LAB — CBC WITH DIFFERENTIAL/PLATELET
Absolute Monocytes: 733 cells/uL (ref 200–950)
Basophils Absolute: 70 cells/uL (ref 0–200)
Basophils Relative: 0.9 %
Eosinophils Absolute: 70 cells/uL (ref 15–500)
Eosinophils Relative: 0.9 %
HCT: 39 % (ref 35.0–45.0)
Hemoglobin: 13 g/dL (ref 11.7–15.5)
Lymphs Abs: 2129 cells/uL (ref 850–3900)
MCH: 29.9 pg (ref 27.0–33.0)
MCHC: 33.3 g/dL (ref 32.0–36.0)
MCV: 89.7 fL (ref 80.0–100.0)
MPV: 11.5 fL (ref 7.5–12.5)
Monocytes Relative: 9.4 %
Neutro Abs: 4797 cells/uL (ref 1500–7800)
Neutrophils Relative %: 61.5 %
Platelets: 207 10*3/uL (ref 140–400)
RBC: 4.35 10*6/uL (ref 3.80–5.10)
RDW: 13.1 % (ref 11.0–15.0)
Total Lymphocyte: 27.3 %
WBC: 7.8 10*3/uL (ref 3.8–10.8)

## 2021-09-18 LAB — COMPLETE METABOLIC PANEL WITH GFR
AG Ratio: 1.4 (calc) (ref 1.0–2.5)
ALT: 11 U/L (ref 6–29)
AST: 16 U/L (ref 10–35)
Albumin: 4.4 g/dL (ref 3.6–5.1)
Alkaline phosphatase (APISO): 59 U/L (ref 37–153)
BUN: 19 mg/dL (ref 7–25)
CO2: 27 mmol/L (ref 20–32)
Calcium: 10.1 mg/dL (ref 8.6–10.4)
Chloride: 103 mmol/L (ref 98–110)
Creat: 0.78 mg/dL (ref 0.60–0.95)
Globulin: 3.1 g/dL (calc) (ref 1.9–3.7)
Glucose, Bld: 87 mg/dL (ref 65–99)
Potassium: 4.8 mmol/L (ref 3.5–5.3)
Sodium: 139 mmol/L (ref 135–146)
Total Bilirubin: 0.4 mg/dL (ref 0.2–1.2)
Total Protein: 7.5 g/dL (ref 6.1–8.1)
eGFR: 75 mL/min/{1.73_m2} (ref >=60)

## 2021-09-22 ENCOUNTER — Telehealth: Payer: Self-pay

## 2021-09-22 NOTE — Telephone Encounter (Signed)
Copied from CRM [id number]. Topic: General - Call Back - No Documentation >> Sep 22, 2021 10:59 AM Franchot Heidelberg wrote: Reason for CRM: Pt called requesting to speak to a nurse, only states that she has an important question to ask and declined to disclose any further info. Please advise   Best contact: [phone]

## 2021-09-23 ENCOUNTER — Ambulatory Visit: Payer: Self-pay | Admitting: *Deleted

## 2021-09-23 NOTE — Telephone Encounter (Signed)
  Chief Complaint: Lab Results Symptoms: NA Frequency: NA Pertinent Negatives: Patient denies NA Disposition: [] ED /[] Urgent Care (no appt availability in office) / [] Appointment(In office/virtual)/ []  [place] [facility]/ [x] Home Care/ [] Refused Recommended Disposition /[] [place] [facility]/ []  Follow-up with PCP Additional Notes: Pt called for lab results but would not disclose request to agent. Results from 09/17/21 read to patient:  Normal CBC - anemia resolved Iron storage is normal  Sugar, kidney and liver function tests are within normal limits  Sodium is back to normal range

## 2021-09-23 NOTE — Telephone Encounter (Signed)
Lab results given

## 2021-10-22 ENCOUNTER — Inpatient Hospital Stay: Payer: Medicare PPO | Admitting: Family Medicine

## 2022-03-14 ENCOUNTER — Other Ambulatory Visit: Payer: Self-pay | Admitting: Family Medicine

## 2022-03-14 ENCOUNTER — Telehealth: Payer: Self-pay

## 2022-03-14 DIAGNOSIS — I10 Essential (primary) hypertension: Secondary | ICD-10-CM

## 2022-03-14 MED ORDER — LISINOPRIL 2.5 MG PO TABS: 2.5000 mg | ORAL_TABLET | Freq: Every day | ORAL | 0 refills | Status: DC

## 2022-03-14 NOTE — Telephone Encounter (Signed)
Copied from [place] [id number]. Topic: General - Other >> Mar 14, 2022  8:14 AM Oley Balm A wrote: Reason for CRM: Pt is calling requesting to speak with Dr. Ancil Boozer nurse. She states that she has an important question and need to talk with her. Please call pt back    Returned call to patient, she needed a refill for lisinopril as she does not have enough to make it until her appointment. Per Dr. Ancil Boozer, refill sent.

## 2022-03-14 NOTE — Telephone Encounter (Signed)
Medication Refill - Medication: lisinopril (ZESTRIL) 2.5 MG tablet   Has the patient contacted their pharmacy? Yes.     Preferred Pharmacy (with phone number or street name):  [place], [place] Phone: [phone]  Fax: [phone]     Has the patient been seen for an appointment in the last year OR does the patient have an upcoming appointment? Yes.    Please assist patient further

## 2022-03-15 NOTE — Telephone Encounter (Signed)
Unable to refill per protocol, Rx request is too soon. Last refilled 03/14/22 for 30 days.  Requested Prescriptions  Pending Prescriptions Disp Refills   lisinopril (ZESTRIL) 2.5 MG tablet 90 tablet 1    Sig: Take 1 tablet (2.5 mg total) by mouth daily.     Cardiovascular:  ACE Inhibitors Passed - 03/14/2022  9:51 AM      Passed - Cr in normal range and within 180 days    Creat  Date Value Ref Range Status  09/17/2021 0.78 0.60 - 0.95 mg/dL Final         Passed - K in normal range and within 180 days    Potassium  Date Value Ref Range Status  09/17/2021 4.8 3.5 - 5.3 mmol/L Final         Passed - Patient is not pregnant      Passed - Last BP in normal range    BP Readings from Last 1 Encounters:  09/17/21 128/70         Passed - Valid encounter within last 6 months    Recent Outpatient Visits           5 months ago Senile purpura [facility])   [facility] Steele Sizer, MD   1 year ago Senile purpura [facility])   [facility] Steele Sizer, MD   2 years ago Dyslipidemia   [facility] Steele Sizer, MD   2 years ago Dyslipidemia   [facility] Steele Sizer, MD   3 years ago Hyponatremia   [facility] Steele Sizer, MD

## 2022-03-18 ENCOUNTER — Ambulatory Visit: Payer: Medicare PPO | Admitting: Family Medicine

## 2022-04-11 ENCOUNTER — Ambulatory Visit (INDEPENDENT_AMBULATORY_CARE_PROVIDER_SITE_OTHER): Payer: Medicare HMO | Admitting: Family Medicine

## 2022-04-11 ENCOUNTER — Encounter: Payer: Self-pay | Admitting: Family Medicine

## 2022-04-11 VITALS — BP 128/68 | HR 80 | Temp 97.7°F | Resp 14 | Ht 62.0 in | Wt 142.0 lb

## 2022-04-11 DIAGNOSIS — D692 Other nonthrombocytopenic purpura: Secondary | ICD-10-CM | POA: Diagnosis not present

## 2022-04-11 DIAGNOSIS — M810 Age-related osteoporosis without current pathological fracture: Secondary | ICD-10-CM

## 2022-04-11 DIAGNOSIS — Z8781 Personal history of (healed) traumatic fracture: Secondary | ICD-10-CM

## 2022-04-11 DIAGNOSIS — E785 Hyperlipidemia, unspecified: Secondary | ICD-10-CM

## 2022-04-11 DIAGNOSIS — I10 Essential (primary) hypertension: Secondary | ICD-10-CM

## 2022-04-11 DIAGNOSIS — E871 Hypo-osmolality and hyponatremia: Secondary | ICD-10-CM

## 2022-04-11 MED ORDER — LISINOPRIL 2.5 MG PO TABS: 2.5000 mg | ORAL_TABLET | Freq: Every day | ORAL | 1 refills | Status: DC

## 2022-04-11 MED ORDER — ATORVASTATIN CALCIUM 10 MG PO TABS
10.0000 mg | ORAL_TABLET | Freq: Every day | ORAL | 1 refills | Status: DC
Start: 2022-04-11 — End: 2022-09-16

## 2022-04-11 NOTE — Progress Notes (Signed)
Name: Sandra [name]   MRN: [id number]    DOB: 1940-11-12   Date:04/11/2022       Progress Note  Subjective  Chief Complaint  Follow Up  HPI  History of fracture of right hip: it happened in 08/2018 and left hip in 05/2021 , discussed importance of checking bone density and starting therapy, she continues to refuse bone density or medications  Senile purpura: she states still bruises on arms intermittent, she states doing better, not as often , reassurance given    HTN: she is down to 2.5 mg of lisinopril daily. No chest pain, palpitation or SOB. She does not want to stop medications since sometimes she eats a high salt diet  She denies orthostatic changes    Dyslipidemia: today she said she is willing to try a low dose of statin therapy. We will give her atorvastatin 10 mg today    Hyponatremia : last level was  131.  This has been ongoing for a few years and was stable for a long time, last time we checked it was back to normal    History of acute right 3rd nerve palsy: seen by ophthalmologist back in 05/2021 and went to [facility] , MRI brain and MR angio head was done and results below, discussed starting her on cholesterol medication, but she is not interested No other problems since   IMPRESSION: 1. No acute intracranial process. No evidence of acute or subacute infarct. 2. No intracranial large vessel occlusion or severe stenosis. Moderate narrowing is suspected in the inferior right M2 branch, and there is poor signal in the right PICA, likely indicating poor flow without focal stenosis.  Patient Active Problem List   Diagnosis Date Noted   History of fracture of both hips 09/17/2021   Femoral neck fracture ([place]) 06/02/2021   Dyslipidemia 05/14/2019   Hyponatremia 08/27/2018   Hiatal hernia 10/27/2014   Hypertension, benign 10/27/2014   Female pattern hair loss 10/27/2014   Nocturia 10/27/2014   Left-sided Bell's palsy 10/27/2014   Generalized anxiety disorder 10/27/2014     Past Surgical History:  Procedure Laterality Date   ABDOMINAL HYSTERECTOMY     HIP ARTHROPLASTY Left 06/03/2021   Procedure: ARTHROPLASTY BIPOLAR HIP (HEMIARTHROPLASTY);  Surgeon: Lovell Sheehan, MD;  Location: [facility] ORS;  Service: Orthopedics;  Laterality: Left;   HIP SURGERY  01/22/2019   INTRAMEDULLARY (IM) NAIL INTERTROCHANTERIC Right 08/19/2018   Procedure: INTRAMEDULLARY (IM) NAIL INTERTROCHANTRIC;  Surgeon: Nida Boatman, MD;  Location: [facility] ORS;  Service: Orthopedics;  Laterality: Right;   MULTIPLE TOOTH EXTRACTIONS N/A    patient had all of her bottom teeth pulled about 2-3 weeks ago.   TONSILLECTOMY      Family History  Problem Relation Age of Onset   Hypertension Daughter    Hypertension Sister    Hypertension Brother     Social History   Tobacco Use   Smoking status: Never   Smokeless tobacco: Never  Substance Use Topics   Alcohol use: No    Alcohol/week: 0.0 standard drinks of alcohol     Current Outpatient Medications:    aspirin 81 MG chewable tablet, Chew 1 tablet (81 mg total) by mouth 2 (two) times daily., Disp: 60 tablet, Rfl: 0   Cholecalciferol (VITAMIN D3) 50 MCG (2000 UT) capsule, Take 1 capsule (2,000 Units total) by mouth daily., Disp: 30 capsule, Rfl: 3   docusate sodium (COLACE) 100 MG capsule, Take 1 capsule (100 mg total) by mouth 2 (two) times daily.,  Disp: 30 capsule, Rfl: 0   lisinopril (ZESTRIL) 2.5 MG tablet, Take 1 tablet (2.5 mg total) by mouth daily., Disp: 30 tablet, Rfl: 0   Multiple Vitamin (MULTIVITAMIN) tablet, Take 1 tablet by mouth daily., Disp: 30 tablet, Rfl: 3  No Known Allergies  I personally reviewed active problem list, medication list, allergies, family history, social history, health maintenance with the patient/caregiver today.   ROS  Constitutional: Negative for fever or weight change.  Respiratory: Negative for cough and shortness of breath.   Cardiovascular: Negative for chest pain or palpitations.   Gastrointestinal: Negative for abdominal pain, no bowel changes.  Musculoskeletal: Negative for gait problem or joint swelling.  Skin: Negative for rash.  Neurological: Negative for dizziness or headache.  No other specific complaints in a complete review of systems (except as listed in HPI above).   Objective  Vitals:   04/11/22 1424  BP: 128/68  Pulse: 80  Resp: 14  Temp: 97.7 F (36.5 C)  TempSrc: Oral  SpO2: 99%  Weight: 142 lb (64.4 kg)  Height: 5\' 2"  (1.575 m)    Body mass index is 25.97 kg/m.  Physical Exam  Constitutional: Patient appears well-developed and well-nourished.  No distress.  HEENT: head atraumatic, normocephalic, pupils equal and reactive to light, neck supple Cardiovascular: Normal rate, regular rhythm and normal heart sounds.  No murmur heard. No BLE edema. Pulmonary/Chest: Effort normal and breath sounds normal. No respiratory distress. Abdominal: Soft.  There is no tenderness. Muscular Skeletal: slow gait - using a cane Psychiatric: Patient has a normal mood and affect. behavior is normal. Judgment and thought content normal.   PHQ2/9:    04/11/2022    2:25 PM 09/17/2021   10:46 AM 05/06/2021   11:49 AM 06/17/2020    2:55 PM 04/21/2020    3:10 PM  Depression screen PHQ 2/9  Decreased Interest 0 0 0 0 0  Down, Depressed, Hopeless 0 0 0 0 1  PHQ - 2 Score 0 0 0 0 1  Altered sleeping 0 0     Tired, decreased energy 0 0     Change in appetite 0 0     Feeling bad or failure about yourself  0 0     Trouble concentrating 0 0     Moving slowly or fidgety/restless 0 0     Suicidal thoughts 0 0     PHQ-9 Score 0 0     Difficult doing work/chores  Not difficult at all       phq 9 is negative   Fall Risk:    04/11/2022    2:25 PM 09/17/2021   10:46 AM 05/06/2021   11:51 AM 06/17/2020    2:55 PM 04/21/2020    3:12 PM  Fall Risk   Falls in the past year? 1 0 0 0 0  Number falls in past yr: 0 0 0 0 0  Injury with Fall? 1 0 0 0 0  Risk for fall due to  : History of fall(s);Impaired balance/gait No Fall Risks No Fall Risks  History of fall(s)  Follow up Falls prevention discussed;Education provided;Falls evaluation completed Falls prevention discussed;Education provided Falls prevention discussed  Falls prevention discussed      Functional Status Survey: Is the patient deaf or have difficulty hearing?: No Does the patient have difficulty seeing, even when wearing glasses/contacts?: No Does the patient have difficulty concentrating, remembering, or making decisions?: No Does the patient have difficulty walking or climbing stairs?: Yes Does the patient  have difficulty dressing or bathing?: No Does the patient have difficulty doing errands alone such as visiting a doctor's office or shopping?: No    Assessment & Plan  1. Senile purpura ([place])  Reassurance given   2. Age-related osteoporosis without current pathological fracture  She does not want   3. Dyslipidemia  - atorvastatin (LIPITOR) 10 MG tablet; Take 1 tablet (10 mg total) by mouth daily.  Dispense: 90 tablet; Refill: 1  4. Hyponatremia  Recheck next visit   5. History of fracture of both hips  Using a cane , doing home PT now   6. Hypertension, benign  - lisinopril (ZESTRIL) 2.5 MG tablet; Take 1 tablet (2.5 mg total) by mouth daily.  Dispense: 90 tablet; Refill: 1

## 2022-05-12 ENCOUNTER — Ambulatory Visit: Payer: Medicare HMO

## 2022-05-12 VITALS — Ht 63.0 in | Wt 142.0 lb

## 2022-05-12 DIAGNOSIS — Z0000 Encounter for general adult medical examination without abnormal findings: Secondary | ICD-10-CM

## 2022-05-12 NOTE — Progress Notes (Signed)
I connected with  Sandra [name] on 05/12/22 by a audio enabled telemedicine application and verified that I am speaking with the correct person using two identifiers.  Patient Location: Home  Provider Location: Office/Clinic  I discussed the limitations of evaluation and management by telemedicine. The patient expressed understanding and agreed to proceed.  Subjective:   Sandra [name] is a 82 y.o. female who presents for Medicare Annual (Subsequent) preventive examination.  Review of Systems     Cardiac Risk Factors include: advanced age (>58men, >68 women);dyslipidemia;hypertension;sedentary lifestyle     Objective:    Today's Vitals   05/12/22 1104  Weight: 142 lb (64.4 kg)  Height: 5\' 3"  (1.6 m)   Body mass index is 25.15 kg/m.     05/12/2022   11:16 AM 06/03/2021    2:56 PM 06/02/2021   12:00 PM 06/02/2021   11:02 AM 05/24/2021    4:21 PM 05/06/2021   11:50 AM 04/21/2020    3:11 PM  Advanced Directives  Does Patient Have a Medical Advance Directive? No No No No No No No  Would patient like information on creating a medical advance directive?  No - Patient declined No - Patient declined   No - Patient declined No - Patient declined    Current Medications (verified) Outpatient Encounter Medications as of 05/12/2022  Medication Sig   aspirin 81 MG chewable tablet Chew 1 tablet (81 mg total) by mouth 2 (two) times daily.   atorvastatin (LIPITOR) 10 MG tablet Take 1 tablet (10 mg total) by mouth daily.   Cholecalciferol (VITAMIN D3) 50 MCG (2000 UT) capsule Take 1 capsule (2,000 Units total) by mouth daily.   docusate sodium (COLACE) 100 MG capsule Take 1 capsule (100 mg total) by mouth 2 (two) times daily.   lisinopril (ZESTRIL) 2.5 MG tablet Take 1 tablet (2.5 mg total) by mouth daily.   Multiple Vitamin (MULTIVITAMIN) tablet Take 1 tablet by mouth daily.   No facility-administered encounter medications on file as of 05/12/2022.    Allergies (verified) Patient  has no known allergies.   History: Past Medical History:  Diagnosis Date   Anxiety    Bell's palsy    Changes in skin texture    Femoral neck fracture 06/02/2021   Hair loss    Hiatal hernia    Hypertension    Murmur, cardiac    Nocturia    Ovarian failure    Past Surgical History:  Procedure Laterality Date   ABDOMINAL HYSTERECTOMY     HIP ARTHROPLASTY Left 06/03/2021   Procedure: ARTHROPLASTY BIPOLAR HIP (HEMIARTHROPLASTY);  Surgeon: Lyndle Herrlich, MD;  Location: [facility] ORS;  Service: Orthopedics;  Laterality: Left;   HIP SURGERY  01/22/2019   INTRAMEDULLARY (IM) NAIL INTERTROCHANTERIC Right 08/19/2018   Procedure: INTRAMEDULLARY (IM) NAIL INTERTROCHANTRIC;  Surgeon: Karleen Hampshire, MD;  Location: [facility] ORS;  Service: Orthopedics;  Laterality: Right;   MULTIPLE TOOTH EXTRACTIONS N/A    patient had all of her bottom teeth pulled about 2-3 weeks ago.   TONSILLECTOMY     Family History  Problem Relation Age of Onset   Hypertension Daughter    Hypertension Sister    Hypertension Brother    Social History   Socioeconomic History   Marital status: Widowed    Spouse name: Not on file   Number of children: 2   Years of education: 98   Highest education level: High school graduate  Occupational History   Not on file  Tobacco Use  Smoking status: Never   Smokeless tobacco: Never  Vaping Use   Vaping Use: Never used  Substance and Sexual Activity   Alcohol use: No    Alcohol/week: 0.0 standard drinks of alcohol   Drug use: No   Sexual activity: Not Currently  Other Topics Concern   Not on file  Social History Narrative   She lives in an independent living facility - [place], but will spend winter with her sister in [place]       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    Social Determinants of Health   Financial Resource Strain: Low Risk  (05/12/2022)   Overall Financial Resource Strain (CARDIA)    Difficulty of Paying Living Expenses: Not hard at all  Food  Insecurity: No Food Insecurity (05/12/2022)   Hunger Vital Sign    Worried About Running Out of Food in the Last Year: Never true    Ran Out of Food in the Last Year: Never true  Transportation Needs: No Transportation Needs (05/12/2022)   PRAPARE - Administrator, Civil Service (Medical): No    Lack of Transportation (Non-Medical): No  Physical Activity: Inactive (05/12/2022)   Exercise Vital Sign    Days of Exercise per Week: 0 days    Minutes of Exercise per Session: 0 min  Stress: No Stress Concern Present (05/12/2022)   [employer] of Occupational Health - Occupational Stress Questionnaire    Feeling of Stress : Not at all  Social Connections: Moderately Isolated (05/12/2022)   Social Connection and Isolation Panel [NHANES]    Frequency of Communication with Friends and Family: More than three times a week    Frequency of Social Gatherings with Friends and Family: Twice a week    Attends Religious Services: 1 to 4 times per year    Active Member of [employer] or Organizations: No    Attends Banker Meetings: Never    Marital Status: Widowed    Tobacco Counseling Counseling given: Not Answered  Clinical Intake:  Pre-visit preparation completed: Yes  Pain : No/denies pain   BMI - recorded: 25.15 Nutritional Status: BMI 25 -29 Overweight Nutritional Risks: None Diabetes: No  How often do you need to have someone help you when you read instructions, pamphlets, or other written materials from your doctor or pharmacy?: 1 - Never  Diabetic?no  Interpreter Needed?: No  Comments: lives alone but complex over 50 Information entered by :: B.Josemiguel Gries,LPN   Activities of Daily Living    05/12/2022   11:16 AM 04/11/2022    2:26 PM  In your present state of health, do you have any difficulty performing the following activities:  Hearing? 0 0  Vision? 0 0  Difficulty concentrating or making decisions? 0 0  Walking or climbing stairs? 1 1  Dressing or  bathing? 0 0  Doing errands, shopping? 1 0  Preparing Food and eating ? N   Using the Toilet? N   In the past six months, have you accidently leaked urine? N   Do you have problems with loss of bowel control? N   Managing your Medications? N   Managing your Finances? N   Housekeeping or managing your Housekeeping? Y   Comment daughter helps     Patient Care Team: Alba Cory, MD as PCP - General (Family Medicine)  Indicate any recent Medical Services you may have received from other than [facility] providers in the past year (date may be approximate).  Assessment:   This is a routine wellness examination for Sandra.  Hearing/Vision screen Hearing Screening - Comments:: Adequate hearing Vision Screening - Comments:: Adequate vision w/glasses [facility]  Dietary issues and exercise activities discussed: Current Exercise Habits: The patient does not participate in regular exercise at present, Exercise limited by: orthopedic condition(s)   Goals Addressed             This Visit's Progress    Prevent falls   Not on track    Recommend continuing physical therapy exercises at home to improve gait and mobility and prevent future falls.        Depression Screen    05/12/2022   11:12 AM 04/11/2022    2:25 PM 09/17/2021   10:46 AM 05/06/2021   11:49 AM 06/17/2020    2:55 PM 04/21/2020    3:10 PM 11/13/2019    9:15 AM  PHQ 2/9 Scores  PHQ - 2 Score 0 0 0 0 0 1 0  PHQ- 9 Score  0 0    0    Fall Risk    05/12/2022   11:08 AM 04/11/2022    2:25 PM 09/17/2021   10:46 AM 05/06/2021   11:51 AM 06/17/2020    2:55 PM  Fall Risk   Falls in the past year? 1 1 0 0 0  Number falls in past yr: 0 0 0 0 0  Injury with Fall? 1 1 0 0 0  Comment fractured rt hip      Risk for fall due to : No Fall Risks History of fall(s);Impaired balance/gait No Fall Risks No Fall Risks   Follow up Education provided;Falls prevention discussed Falls prevention discussed;Education provided;Falls  evaluation completed Falls prevention discussed;Education provided Falls prevention discussed     FALL RISK PREVENTION PERTAINING TO THE HOME:  Any stairs in or around the home? Yes  uses elevator If so, are there any without handrails? Yes  Home free of loose throw rugs in walkways, pet beds, electrical cords, etc? Yes  Adequate lighting in your home to reduce risk of falls? Yes   ASSISTIVE DEVICES UTILIZED TO PREVENT FALLS:  Life alert? No  Use of a cane, walker or w/c? Yes cane Grab bars in the bathroom? Yes  Shower chair or bench in shower? Yes  Elevated toilet seat or a handicapped toilet? Yes   Cognitive Function:        05/12/2022   11:18 AM 04/21/2020    3:13 PM 02/14/2019    2:59 PM  6CIT Screen  What Year? 0 points 0 points 0 points  What month? 0 points 0 points 0 points  What time? 0 points 0 points 0 points  Count back from 20 0 points 0 points 0 points  Months in reverse 0 points 0 points 0 points  Repeat phrase 2 points 0 points 4 points  Total Score 2 points 0 points 4 points    Immunizations Immunization History  Administered Date(s) Administered   PFIZER(Purple Top)SARS-COV-2 Vaccination 03/20/2019, 04/07/2019   Pneumococcal Conjugate-13 10/27/2014   Pneumococcal Polysaccharide-23 11/22/2010    TDAP status: Up to date  Flu Vaccine status: Up to date  Pneumococcal vaccine status: Up to date  Covid-19 vaccine status: Completed vaccines  Qualifies for Shingles Vaccine? Yes   Zostavax completed No   Shingrix Completed?: No.    Education has been provided regarding the importance of this vaccine. Patient has been advised to call insurance company to determine out of pocket expense if  they have not yet received this vaccine. Advised may also receive vaccine at local pharmacy or Health Dept. Verbalized acceptance and understanding.  Screening Tests Health Maintenance  Topic Date Due   COVID-19 Vaccine (3 - 2023-24 season) 09/17/2021   Zoster  Vaccines- Shingrix (1 of 2) 07/12/2022 (Originally 04/18/1990)   INFLUENZA VACCINE  08/18/2022   Medicare Annual Wellness (AWV)  05/12/2023   Pneumonia Vaccine 19+ Years old  Completed   HPV VACCINES  Aged Out   DTaP/Tdap/Td  Discontinued   DEXA SCAN  Discontinued    Health Maintenance  Health Maintenance Due  Topic Date Due   COVID-19 Vaccine (3 - 2023-24 season) 09/17/2021    Colorectal cancer screening: No longer required.   Mammogram status: No longer required due to age.  Bone Density status: Completed yes. Results reflect: Bone density results: OSTEOPOROSIS. Repeat every 5 years. No longer to receive  Lung Cancer Screening: (Low Dose CT Chest recommended if Age 96-80 years, 30 pack-year currently smoking OR have quit w/in 15years.) does not qualify.   Lung Cancer Screening Referral: no  Additional Screening:  Hepatitis C Screening: does not qualify; Completed no  Vision Screening: Recommended annual ophthalmology exams for early detection of glaucoma and other disorders of the eye. Is the patient up to date with their annual eye exam?  Yes  Who is the provider or what is the name of the office in which the patient attends annual eye exams? [facility] If pt is not established with a provider, would they like to be referred to a provider to establish care? No .   Dental Screening: Recommended annual dental exams for proper oral hygiene  Community Resource Referral / Chronic Care Management: CRR required this visit?  No   CCM required this visit?  No    Plan:     I have personally reviewed and noted the following in the patient's chart:   Medical and social history Use of alcohol, tobacco or illicit drugs  Current medications and supplements including opioid prescriptions. Patient is not currently taking opioid prescriptions. Functional ability and status Nutritional status Physical activity Advanced directives List of other physicians Hospitalizations,  surgeries, and ER visits in previous 12 months Vitals Screenings to include cognitive, depression, and falls Referrals and appointments  In addition, I have reviewed and discussed with patient certain preventive protocols, quality metrics, and best practice recommendations. A written personalized care plan for preventive services as well as general preventive health recommendations were provided to patient.    Sue Lush, LPN   1/61/0960   Nurse Notes: Pt is doing well still recovering from rt hip fracture last year. She says she still has a limp but doing alright, is visiting daughter in [place] right now. She has no concerns or questions at this time

## 2022-05-12 NOTE — Patient Instructions (Signed)
Sandra [name] , Thank you for taking time to come for your Medicare Wellness Visit. I appreciate your ongoing commitment to your health goals. Please review the following plan we discussed and let me know if I can assist you in the future.   These are the goals we discussed:  Goals      Prevent falls     Recommend continuing physical therapy exercises at home to improve gait and mobility and prevent future falls.         This is a list of the screening recommended for you and due dates:  Health Maintenance  Topic Date Due   COVID-19 Vaccine (3 - 2023-24 season) 09/17/2021   Zoster (Shingles) Vaccine (1 of 2) 07/12/2022*   Flu Shot  08/18/2022   Medicare Annual Wellness Visit  05/12/2023   Pneumonia Vaccine  Completed   HPV Vaccine  Aged Out   DTaP/Tdap/Td vaccine  Discontinued   DEXA scan (bone density measurement)  Discontinued  *Topic was postponed. The date shown is not the original due date.    Advanced directives: no  Conditions/risks identified: moderate falls risk  Next appointment: Follow up in one year for your annual wellness visit 05/18/2023 :30am telephone   Preventive Care 65 Years and Older, Female Preventive care refers to lifestyle choices and visits with your health care provider that can promote health and wellness. What does preventive care include? A yearly physical exam. This is also called an annual well check. Dental exams once or twice a year. Routine eye exams. Ask your health care provider how often you should have your eyes checked. Personal lifestyle choices, including: Daily care of your teeth and gums. Regular physical activity. Eating a healthy diet. Avoiding tobacco and drug use. Limiting alcohol use. Practicing safe sex. Taking low-dose aspirin every day. Taking vitamin and mineral supplements as recommended by your health care provider. What happens during an annual well check? The services and screenings done by your health care  provider during your annual well check will depend on your age, overall health, lifestyle risk factors, and family history of disease. Counseling  Your health care provider may ask you questions about your: Alcohol use. Tobacco use. Drug use. Emotional well-being. Home and relationship well-being. Sexual activity. Eating habits. History of falls. Memory and ability to understand (cognition). Work and work Astronomer. Reproductive health. Screening  You may have the following tests or measurements: Height, weight, and BMI. Blood pressure. Lipid and cholesterol levels. These may be checked every 5 years, or more frequently if you are over 50 years old. Skin check. Lung cancer screening. You may have this screening every year starting at age 65 if you have a 30-pack-year history of smoking and currently smoke or have quit within the past 15 years. Fecal occult blood test (FOBT) of the stool. You may have this test every year starting at age 68. Flexible sigmoidoscopy or colonoscopy. You may have a sigmoidoscopy every 5 years or a colonoscopy every 10 years starting at age 63. Hepatitis C blood test. Hepatitis B blood test. Sexually transmitted disease (STD) testing. Diabetes screening. This is done by checking your blood sugar (glucose) after you have not eaten for a while (fasting). You may have this done every 1-3 years. Bone density scan. This is done to screen for osteoporosis. You may have this done starting at age 89. Mammogram. This may be done every 1-2 years. Talk to your health care provider about how often you should have regular mammograms. Talk  with your health care provider about your test results, treatment options, and if necessary, the need for more tests. Vaccines  Your health care provider may recommend certain vaccines, such as: Influenza vaccine. This is recommended every year. Tetanus, diphtheria, and acellular pertussis (Tdap, Td) vaccine. You may need a Td  booster every 10 years. Zoster vaccine. You may need this after age 38. Pneumococcal 13-valent conjugate (PCV13) vaccine. One dose is recommended after age 41. Pneumococcal polysaccharide (PPSV23) vaccine. One dose is recommended after age 32. Talk to your health care provider about which screenings and vaccines you need and how often you need them. This information is not intended to replace advice given to you by your health care provider. Make sure you discuss any questions you have with your health care provider. Document Released: 01/30/2015 Document Revised: 09/23/2015 Document Reviewed: 11/04/2014 Elsevier Interactive Patient Education  2017 [employer] Prevention in the Home Falls can cause injuries. They can happen to people of all ages. There are many things you can do to make your home safe and to help prevent falls. What can I do on the outside of my home? Regularly fix the edges of walkways and driveways and fix any cracks. Remove anything that might make you trip as you walk through a door, such as a raised step or threshold. Trim any bushes or trees on the path to your home. Use bright outdoor lighting. Clear any walking paths of anything that might make someone trip, such as rocks or tools. Regularly check to see if handrails are loose or broken. Make sure that both sides of any steps have handrails. Any raised decks and porches should have guardrails on the edges. Have any leaves, snow, or ice cleared regularly. Use sand or salt on walking paths during winter. Clean up any spills in your garage right away. This includes oil or grease spills. What can I do in the bathroom? Use night lights. Install grab bars by the toilet and in the tub and shower. Do not use towel bars as grab bars. Use non-skid mats or decals in the tub or shower. If you need to sit down in the shower, use a plastic, non-slip stool. Keep the floor dry. Clean up any water that spills on the floor  as soon as it happens. Remove soap buildup in the tub or shower regularly. Attach bath mats securely with double-sided non-slip rug tape. Do not have throw rugs and other things on the floor that can make you trip. What can I do in the bedroom? Use night lights. Make sure that you have a light by your bed that is easy to reach. Do not use any sheets or blankets that are too big for your bed. They should not hang down onto the floor. Have a firm chair that has side arms. You can use this for support while you get dressed. Do not have throw rugs and other things on the floor that can make you trip. What can I do in the kitchen? Clean up any spills right away. Avoid walking on wet floors. Keep items that you use a lot in easy-to-reach places. If you need to reach something above you, use a strong step stool that has a grab bar. Keep electrical cords out of the way. Do not use floor polish or wax that makes floors slippery. If you must use wax, use non-skid floor wax. Do not have throw rugs and other things on the floor that can make you  trip. What can I do with my stairs? Do not leave any items on the stairs. Make sure that there are handrails on both sides of the stairs and use them. Fix handrails that are broken or loose. Make sure that handrails are as long as the stairways. Check any carpeting to make sure that it is firmly attached to the stairs. Fix any carpet that is loose or worn. Avoid having throw rugs at the top or bottom of the stairs. If you do have throw rugs, attach them to the floor with carpet tape. Make sure that you have a light switch at the top of the stairs and the bottom of the stairs. If you do not have them, ask someone to add them for you. What else can I do to help prevent falls? Wear shoes that: Do not have high heels. Have rubber bottoms. Are comfortable and fit you well. Are closed at the toe. Do not wear sandals. If you use a stepladder: Make sure that it is  fully opened. Do not climb a closed stepladder. Make sure that both sides of the stepladder are locked into place. Ask someone to hold it for you, if possible. Clearly mark and make sure that you can see: Any grab bars or handrails. First and last steps. Where the edge of each step is. Use tools that help you move around (mobility aids) if they are needed. These include: Canes. Walkers. Scooters. Crutches. Turn on the lights when you go into a dark area. Replace any light bulbs as soon as they burn out. Set up your furniture so you have a clear path. Avoid moving your furniture around. If any of your floors are uneven, fix them. If there are any pets around you, be aware of where they are. Review your medicines with your doctor. Some medicines can make you feel dizzy. This can increase your chance of falling. Ask your doctor what other things that you can do to help prevent falls. This information is not intended to replace advice given to you by your health care provider. Make sure you discuss any questions you have with your health care provider. Document Released: 10/30/2008 Document Revised: 06/11/2015 Document Reviewed: 02/07/2014 Elsevier Interactive Patient Education  2017 [employer].

## 2022-08-29 ENCOUNTER — Other Ambulatory Visit: Payer: Self-pay | Admitting: Family Medicine

## 2022-08-29 DIAGNOSIS — I10 Essential (primary) hypertension: Secondary | ICD-10-CM

## 2022-08-29 NOTE — Telephone Encounter (Signed)
Medication Refill - Medication:  lisinopril (ZESTRIL) 2.5 MG tablet  Not out of medication yet but won't have enough to last until appt date 8.30.2024  Patient stated she only needs 5 pills to last until appt date & wants to know the price of 5 pills?  Has the patient contacted their pharmacy? Yes.  Advised to contact PCP  Preferred Pharmacy (with phone number or street name):  [facility] [address] [place], [place] - [address] Phone: [phone]  Fax: [phone]     Has the patient been seen for an appointment in the last year OR does the patient have an upcoming appointment? Yes.  F.U with PCP on 8.30.2024

## 2022-08-30 MED ORDER — LISINOPRIL 2.5 MG PO TABS: 2.5000 mg | ORAL_TABLET | Freq: Every day | ORAL | 0 refills | Status: DC

## 2022-08-30 NOTE — Telephone Encounter (Signed)
Requested Prescriptions  Pending Prescriptions Disp Refills   lisinopril (ZESTRIL) 2.5 MG tablet 90 tablet 0    Sig: Take 1 tablet (2.5 mg total) by mouth daily.     Cardiovascular:  ACE Inhibitors Failed - 08/29/2022 11:20 AM      Failed - Cr in normal range and within 180 days    Creat  Date Value Ref Range Status  09/17/2021 0.78 0.60 - 0.95 mg/dL Final         Failed - K in normal range and within 180 days    Potassium  Date Value Ref Range Status  09/17/2021 4.8 3.5 - 5.3 mmol/L Final         Passed - Patient is not pregnant      Passed - Last BP in normal range    BP Readings from Last 1 Encounters:  04/11/22 128/68         Passed - Valid encounter within last 6 months    Recent Outpatient Visits           4 months ago Senile purpura [facility])   [place] [facility] Alba Cory, MD   11 months ago Senile purpura [facility])   [place] [facility] Alba Cory, MD   2 years ago Senile purpura [facility])   [facility] Alba Cory, MD   2 years ago Dyslipidemia   [facility] Alba Cory, MD   3 years ago Dyslipidemia   [facility] Alba Cory, MD       Future Appointments             In 2 weeks Alba Cory, MD [facility], [facility]

## 2022-09-12 ENCOUNTER — Ambulatory Visit: Payer: Medicare HMO | Admitting: Family Medicine

## 2022-09-16 ENCOUNTER — Encounter: Payer: Self-pay | Admitting: Family Medicine

## 2022-09-16 ENCOUNTER — Ambulatory Visit (INDEPENDENT_AMBULATORY_CARE_PROVIDER_SITE_OTHER): Payer: Medicare HMO | Admitting: Family Medicine

## 2022-09-16 VITALS — BP 126/70 | HR 80 | Temp 97.7°F | Resp 16 | Ht 63.0 in | Wt 143.2 lb

## 2022-09-16 DIAGNOSIS — Z23 Encounter for immunization: Secondary | ICD-10-CM

## 2022-09-16 DIAGNOSIS — I739 Peripheral vascular disease, unspecified: Secondary | ICD-10-CM | POA: Diagnosis not present

## 2022-09-16 DIAGNOSIS — D692 Other nonthrombocytopenic purpura: Secondary | ICD-10-CM

## 2022-09-16 DIAGNOSIS — E785 Hyperlipidemia, unspecified: Secondary | ICD-10-CM | POA: Diagnosis not present

## 2022-09-16 DIAGNOSIS — I10 Essential (primary) hypertension: Secondary | ICD-10-CM

## 2022-09-16 DIAGNOSIS — M810 Age-related osteoporosis without current pathological fracture: Secondary | ICD-10-CM | POA: Diagnosis not present

## 2022-09-16 MED ORDER — LISINOPRIL 2.5 MG PO TABS: 2.5000 mg | ORAL_TABLET | Freq: Every day | ORAL | 1 refills | Status: DC

## 2022-09-16 NOTE — Progress Notes (Signed)
Name: Sandra [name]   MRN: [id number]    DOB: 28-Jan-1940   Date:09/16/2022       Progress Note  Subjective  Chief Complaint  Follow Up  HPI  History of fractures : she feel and broke the right 08/2018 and left hip in 05/2021 , discussed importance of checking bone density and starting therapy, she continues to refuse bone density or medications . Unchanged   Senile purpura: she states still bruises on arms intermittent, stable    HTN: she is down to 2.5 mg of lisinopril daily. No chest pain, palpitation or SOB. She does not want to stop medications since sometimes she eats a high salt diet  She denies orthostatic changes    Dyslipidemia: she never started taking atorvastatin, explained it is okay, she asked me to check her level again    Small vessel disease: History of acute right 3rd nerve palsy: seen by ophthalmologist back in 05/2021 and went to [facility] , MRI brain and MR angio head was done and results below,  we gave her atorvastatin but she never started it   IMPRESSION: 1. No acute intracranial process. No evidence of acute or subacute infarct. 2. No intracranial large vessel occlusion or severe stenosis. Moderate narrowing is suspected in the inferior right M2 branch, and there is poor signal in the right PICA, likely indicating poor flow without focal stenosis.  Patient Active Problem List   Diagnosis Date Noted   Small vessel disease (HCC) 09/16/2022   Senile purpura (HCC) 04/11/2022   History of fracture of both hips 09/17/2021   Constipation, unspecified 06/07/2021   Long term (current) use of aspirin 06/07/2021   Mixed hyperlipidemia 06/07/2021   Other muscle spasm 06/07/2021   Presence of left artificial hip joint 06/07/2021   Unspecified fall, subsequent encounter 06/07/2021   Vitamin D deficiency, unspecified 06/07/2021   Acute posthemorrhagic anemia 06/07/2021   Fracture of unspecified part of neck of left femur, subsequent encounter for closed fracture with  routine healing 06/07/2021   Diaphragmatic hernia without obstruction or gangrene 06/07/2021   Personal history of (healed) traumatic fracture 06/07/2021   Dyslipidemia 05/14/2019   Age-related osteoporosis without current pathological fracture 08/27/2018   Hyponatremia 08/27/2018   Essential (primary) hypertension 08/24/2018   Bell's palsy 08/24/2018   Hiatal hernia 10/27/2014   Hypertension, benign 10/27/2014   Female pattern hair loss 10/27/2014   Nocturia 10/27/2014   Left-sided Bell's palsy 10/27/2014   Generalized anxiety disorder 10/27/2014    Past Surgical History:  Procedure Laterality Date   ABDOMINAL HYSTERECTOMY     HIP ARTHROPLASTY Left 06/03/2021   Procedure: ARTHROPLASTY BIPOLAR HIP (HEMIARTHROPLASTY);  Surgeon: Lyndle Herrlich, MD;  Location: [facility] ORS;  Service: Orthopedics;  Laterality: Left;   HIP SURGERY  01/22/2019   INTRAMEDULLARY (IM) NAIL INTERTROCHANTERIC Right 08/19/2018   Procedure: INTRAMEDULLARY (IM) NAIL INTERTROCHANTRIC;  Surgeon: Karleen Hampshire, MD;  Location: [facility] ORS;  Service: Orthopedics;  Laterality: Right;   MULTIPLE TOOTH EXTRACTIONS N/A    patient had all of her bottom teeth pulled about 2-3 weeks ago.   TONSILLECTOMY      Family History  Problem Relation Age of Onset   Hypertension Daughter    Hypertension Sister    Hypertension Brother     Social History   Tobacco Use   Smoking status: Never   Smokeless tobacco: Never  Substance Use Topics   Alcohol use: No    Alcohol/week: 0.0 standard drinks of alcohol     Current  Outpatient Medications:    aspirin 81 MG chewable tablet, Chew 1 tablet (81 mg total) by mouth 2 (two) times daily., Disp: 60 tablet, Rfl: 0   Cholecalciferol (VITAMIN D3) 50 MCG (2000 UT) capsule, Take 1 capsule (2,000 Units total) by mouth daily., Disp: 30 capsule, Rfl: 3   docusate sodium (COLACE) 100 MG capsule, Take 1 capsule (100 mg total) by mouth 2 (two) times daily., Disp: 30 capsule, Rfl: 0   Multiple  Vitamin (MULTIVITAMIN) tablet, Take 1 tablet by mouth daily., Disp: 30 tablet, Rfl: 3   lisinopril (ZESTRIL) 2.5 MG tablet, Take 1 tablet (2.5 mg total) by mouth daily., Disp: 90 tablet, Rfl: 1  No Known Allergies  I personally reviewed active problem list, medication list, allergies, family history with the patient/caregiver today.   ROS  Ten systems reviewed and is negative except as mentioned in HPI  Objective  Vitals:   09/16/22 1316  BP: 126/70  Pulse: 80  Resp: 16  Temp: 97.7 F (36.5 C)  TempSrc: Oral  SpO2: 98%  Weight: 143 lb 3.2 oz (65 kg)  Height: 5\' 3"  (1.6 m)    Body mass index is 25.37 kg/m.  Physical Exam  Constitutional: Patient appears well-developed and well-nourished. Obese  No distress.  HEENT: head atraumatic, normocephalic, pupils equal and reactive to light, neck supple Cardiovascular: Normal rate, regular rhythm and normal heart sounds.  No murmur heard. No BLE edema. Pulmonary/Chest: Effort normal and breath sounds normal. No respiratory distress. Abdominal: Soft.  There is no tenderness. Psychiatric: Patient has a normal mood and affect. behavior is normal. Judgment and thought content normal.   PHQ2/9:    09/16/2022    1:19 PM 05/12/2022   11:12 AM 04/11/2022    2:25 PM 09/17/2021   10:46 AM 05/06/2021   11:49 AM  Depression screen PHQ 2/9  Decreased Interest 0 0 0 0 0  Down, Depressed, Hopeless 0 0 0 0 0  PHQ - 2 Score 0 0 0 0 0  Altered sleeping 0  0 0   Tired, decreased energy 0  0 0   Change in appetite 0  0 0   Feeling bad or failure about yourself  0  0 0   Trouble concentrating 0  0 0   Moving slowly or fidgety/restless 0  0 0   Suicidal thoughts 0  0 0   PHQ-9 Score 0  0 0   Difficult doing work/chores    Not difficult at all     phq 9 is negative   Fall Risk:    09/16/2022    1:19 PM 05/12/2022   11:08 AM 04/11/2022    2:25 PM 09/17/2021   10:46 AM 05/06/2021   11:51 AM  Fall Risk   Falls in the past year? 0 1 1 0 0   Number falls in past yr: 0 0 0 0 0  Injury with Fall? 0 1 1 0 0  Comment  fractured rt hip     Risk for fall due to : Orthopedic patient No Fall Risks History of fall(s);Impaired balance/gait No Fall Risks No Fall Risks  Follow up Falls prevention discussed;Education provided;Falls evaluation completed Education provided;Falls prevention discussed Falls prevention discussed;Education provided;Falls evaluation completed Falls prevention discussed;Education provided Falls prevention discussed     Functional Status Survey: Is the patient deaf or have difficulty hearing?: No Does the patient have difficulty seeing, even when wearing glasses/contacts?: No Does the patient have difficulty concentrating, remembering, or making decisions?: No  Does the patient have difficulty walking or climbing stairs?: Yes Does the patient have difficulty dressing or bathing?: No Does the patient have difficulty doing errands alone such as visiting a doctor's office or shopping?: No    Assessment & Plan  1. Hypertension, benign  - lisinopril (ZESTRIL) 2.5 MG tablet; Take 1 tablet (2.5 mg total) by mouth daily.  Dispense: 90 tablet; Refill: 1 - COMPLETE METABOLIC PANEL WITH GFR - CBC with Differential/Platelet  2. Senile purpura (HCC)  Reassurance given   3. Small vessel disease (HCC)  Never started statin therapy   4. Age-related osteoporosis without current pathological fracture  Refuses medication   5. Dyslipidemia  - Lipid panel  6. Needs flu shot  - Flu vaccine HIGH DOSE PF (Fluzone High dose)

## 2022-09-17 LAB — CBC WITH DIFFERENTIAL/PLATELET
Absolute Monocytes: 792 {cells}/uL (ref 200–950)
Basophils Absolute: 40 {cells}/uL (ref 0–200)
Basophils Relative: 0.5 %
Eosinophils Absolute: 32 {cells}/uL (ref 15–500)
Eosinophils Relative: 0.4 %
HCT: 38.1 % (ref 35.0–45.0)
Hemoglobin: 12.7 g/dL (ref 11.7–15.5)
Lymphs Abs: 1808 {cells}/uL (ref 850–3900)
MCH: 31 pg (ref 27.0–33.0)
MCHC: 33.3 g/dL (ref 32.0–36.0)
MCV: 92.9 fL (ref 80.0–100.0)
MPV: 10.4 fL (ref 7.5–12.5)
Monocytes Relative: 9.9 %
Neutro Abs: 5328 {cells}/uL (ref 1500–7800)
Neutrophils Relative %: 66.6 %
Platelets: 237 10*3/uL (ref 140–400)
RBC: 4.1 10*6/uL (ref 3.80–5.10)
RDW: 12.2 % (ref 11.0–15.0)
Total Lymphocyte: 22.6 %
WBC: 8 10*3/uL (ref 3.8–10.8)

## 2022-09-17 LAB — COMPLETE METABOLIC PANEL WITH GFR
AG Ratio: 1.3 (calc) (ref 1.0–2.5)
ALT: 10 U/L (ref 6–29)
AST: 15 U/L (ref 10–35)
Albumin: 4 g/dL (ref 3.6–5.1)
Alkaline phosphatase (APISO): 65 U/L (ref 37–153)
BUN: 18 mg/dL (ref 7–25)
CO2: 25 mmol/L (ref 20–32)
Calcium: 9.4 mg/dL (ref 8.6–10.4)
Chloride: 104 mmol/L (ref 98–110)
Creat: 0.77 mg/dL (ref 0.60–0.95)
Globulin: 3 g/dL (ref 1.9–3.7)
Glucose, Bld: 89 mg/dL (ref 65–99)
Potassium: 4.3 mmol/L (ref 3.5–5.3)
Sodium: 138 mmol/L (ref 135–146)
Total Bilirubin: 0.4 mg/dL (ref 0.2–1.2)
Total Protein: 7 g/dL (ref 6.1–8.1)
eGFR: 76 mL/min/{1.73_m2} (ref >=60)

## 2022-09-17 LAB — LIPID PANEL
Cholesterol: 206 mg/dL — ABNORMAL HIGH (ref <200)
HDL: 63 mg/dL (ref >=50)
LDL Cholesterol (Calc): 127 mg/dL — ABNORMAL HIGH
Non-HDL Cholesterol (Calc): 143 mg/dL — ABNORMAL HIGH (ref <130)
Total CHOL/HDL Ratio: 3.3 (calc) (ref <5.0)
Triglycerides: 64 mg/dL (ref <150)

## 2022-09-26 ENCOUNTER — Telehealth: Payer: Self-pay

## 2022-09-26 NOTE — Telephone Encounter (Signed)
Called patient and made aware to start Atorvastatin, and she gave verbal understanding.

## 2022-09-26 NOTE — Telephone Encounter (Signed)
Pt called for lab results. Shared provider's note. Pt states that she was prescribed Atorvastatin 10 mg , but never starting taking it.  Pt is wondering of she should begin to take this medication. Pt would like a call back regarding medication.  Alba Cory, MD 09/19/2022  5:57 PM EDT Back to Top    Bad cholesterol is higher, up from 109 to 116 and now 127 All other labs normal

## 2022-12-25 ENCOUNTER — Other Ambulatory Visit: Payer: Self-pay | Admitting: Family Medicine

## 2022-12-25 DIAGNOSIS — E785 Hyperlipidemia, unspecified: Secondary | ICD-10-CM

## 2022-12-26 ENCOUNTER — Telehealth: Payer: Self-pay | Admitting: Family Medicine

## 2022-12-26 NOTE — Telephone Encounter (Signed)
Pt has been taking it since last visit per pt. [facility].

## 2022-12-26 NOTE — Telephone Encounter (Signed)
Medication Refill -  Most Recent Primary Care Visit:  Provider: Alba Cory  Department: [facility]  Visit Type: OFFICE VISIT  Date: 09/16/2022  Medication: atorvastatin (LIPITOR) 10 MG tablet [[id number]]  DISCONTINUED   Has the patient contacted their pharmacy? Yes (Agent: If no, request that the patient contact the pharmacy for the refill. If patient does not wish to contact the pharmacy document the reason why and proceed with request.) (Agent: If yes, when and what did the pharmacy advise?)  Is this the correct pharmacy for this prescription? Yes If no, delete pharmacy and type the correct one.  This is the patient's preferred pharmacy:  [facility] [address] [place] - [address] [place] [address] Phone: [phone] Fax: [phone]   Has the prescription been filled recently? Yes  Is the patient out of the medication? No Patient reports that she has one pill left.  Has the patient been seen for an appointment in the last year OR does the patient have an upcoming appointment? Yes  Can we respond through MyChart? No  Agent: Please be advised that Rx refills may take up to 3 business days. We ask that you follow-up with your pharmacy.

## 2022-12-26 NOTE — Telephone Encounter (Signed)
The patient has made additional contact regarding their request for the medication   Please contact if/when available

## 2022-12-26 NOTE — Telephone Encounter (Signed)
Pt called for refill of atorvastatin. Not on current med profile. LRF 04/11/22  #90  1 refill. Please advise

## 2022-12-27 ENCOUNTER — Other Ambulatory Visit: Payer: Self-pay | Admitting: Family Medicine

## 2022-12-27 ENCOUNTER — Telehealth: Payer: Self-pay | Admitting: Family Medicine

## 2022-12-27 DIAGNOSIS — I739 Peripheral vascular disease, unspecified: Secondary | ICD-10-CM

## 2022-12-27 MED ORDER — ATORVASTATIN CALCIUM 10 MG PO TABS: 10.0000 mg | ORAL_TABLET | Freq: Every day | ORAL | 3 refills | Status: AC

## 2022-12-27 NOTE — Telephone Encounter (Signed)
Pt has been taking it, sent you the request rx message FYI

## 2022-12-27 NOTE — Telephone Encounter (Signed)
Medication Refill -  Most Recent Primary Care Visit:  Provider: Alba Cory  Department: [facility]  Visit Type: OFFICE VISIT  Date: 09/16/2022  Medication: atorvastatin (LIPITOR) 10 MG tablet   Has the patient contacted their pharmacy? No  Is this the correct pharmacy for this prescription? Yes  This is the patient's preferred pharmacy:  [facility] [address] [place] - [address] [place] [address] Phone: [phone] Fax: [phone]   Has the prescription been filled recently? No  Is the patient out of the medication? Yes  Has the patient been seen for an appointment in the last year OR does the patient have an upcoming appointment? Yes  Can we respond through MyChart? No  Agent: Please be advised that Rx refills may take up to 3 business days. We ask that you follow-up with your pharmacy.  Patient said she took her last pill today

## 2023-04-03 ENCOUNTER — Ambulatory Visit: Payer: Self-pay | Admitting: Family Medicine

## 2023-05-17 ENCOUNTER — Ambulatory Visit: Payer: Medicare PPO | Admitting: Family Medicine

## 2023-05-18 ENCOUNTER — Ambulatory Visit: Payer: Medicare (Managed Care)

## 2023-05-18 DIAGNOSIS — Z0000 Encounter for general adult medical examination without abnormal findings: Secondary | ICD-10-CM

## 2023-05-18 NOTE — Progress Notes (Signed)
 Subjective:   Sandra [name] is a 83 y.o. who presents for a Medicare Wellness preventive visit.  Visit Complete: Virtual I connected with  Sandra [name] on 05/18/23 by a audio enabled telemedicine application and verified that I am speaking with the correct person using two identifiers.  Patient Location: Home  Provider Location: Office/Clinic  I discussed the limitations of evaluation and management by telemedicine. The patient expressed understanding and agreed to proceed.  Vital Signs: Because this visit was a virtual/telehealth visit, some criteria may be missing or patient reported. Any vitals not documented were not able to be obtained and vitals that have been documented are patient reported.  VideoDeclined- This patient declined Librarian, academic. Therefore the visit was completed with audio only.  Persons Participating in Visit: Patient.  AWV Questionnaire: No: Patient Medicare AWV questionnaire was not completed prior to this visit.  Cardiac Risk Factors include: advanced age (>26men, >37 women);hypertension;dyslipidemia     Objective:    There were no vitals filed for this visit. There is no height or weight on file to calculate BMI.     05/18/2023    9:36 AM 05/12/2022   11:16 AM 06/03/2021    2:56 PM 06/02/2021   12:00 PM 06/02/2021   11:02 AM 05/24/2021    4:21 PM 05/06/2021   11:50 AM  Advanced Directives  Does Patient Have a Medical Advance Directive? No No No No No No No  Would patient like information on creating a medical advance directive? No - Patient declined  No - Patient declined No - Patient declined   No - Patient declined    Current Medications (verified) Outpatient Encounter Medications as of 05/18/2023  Medication Sig   atorvastatin  (LIPITOR) 10 MG tablet Take 1 tablet (10 mg total) by mouth daily.   Cholecalciferol  (VITAMIN D3) 50 MCG (2000 UT) capsule Take 1 capsule (2,000 Units total) by mouth daily.    docusate sodium  (COLACE) 100 MG capsule Take 1 capsule (100 mg total) by mouth 2 (two) times daily.   lisinopril  (ZESTRIL ) 2.5 MG tablet Take 1 tablet (2.5 mg total) by mouth daily.   Multiple Vitamin (MULTIVITAMIN) tablet Take 1 tablet by mouth daily.   aspirin  81 MG chewable tablet Chew 1 tablet (81 mg total) by mouth 2 (two) times daily. (Patient not taking: Reported on 05/18/2023)   No facility-administered encounter medications on file as of 05/18/2023.    Allergies (verified) Patient has no allergy information on record.   History: Past Medical History:  Diagnosis Date   Anxiety    Bell's palsy    Changes in skin texture    Femoral neck fracture (HCC) 06/02/2021   Hair loss    Hiatal hernia    Hypertension    Murmur, cardiac    Nocturia    Ovarian failure    Past Surgical History:  Procedure Laterality Date   ABDOMINAL HYSTERECTOMY     HIP ARTHROPLASTY Left 06/03/2021   Procedure: ARTHROPLASTY BIPOLAR HIP (HEMIARTHROPLASTY);  Surgeon: Jerlyn Moons, MD;  Location: [facility] ORS;  Service: Orthopedics;  Laterality: Left;   HIP SURGERY  01/22/2019   INTRAMEDULLARY (IM) NAIL INTERTROCHANTERIC Right 08/19/2018   Procedure: INTRAMEDULLARY (IM) NAIL INTERTROCHANTRIC;  Surgeon: Arnie Bibber, MD;  Location: [facility] ORS;  Service: Orthopedics;  Laterality: Right;   MULTIPLE TOOTH EXTRACTIONS N/A    patient had all of her bottom teeth pulled about 2-3 weeks ago.   TONSILLECTOMY     Family History  Problem Relation Age of Onset   Hypertension Daughter    Hypertension Sister    Hypertension Brother    Social History   Socioeconomic History   Marital status: Widowed    Spouse name: Not on file   Number of children: 2   Years of education: 12   Highest education level: High school graduate  Occupational History   Not on file  Tobacco Use   Smoking status: Never   Smokeless tobacco: Never  Vaping Use   Vaping status: Never Used  Substance and Sexual Activity   Alcohol use: No     Alcohol/week: 0.0 standard drinks of alcohol   Drug use: No   Sexual activity: Not Currently  Other Topics Concern   Not on file  Social History Narrative   She lives in an independent living facility - [place], but will spend winter with her sister in [place]        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    Social Drivers of Corporate investment banker Strain: Low Risk  (05/18/2023)   Overall Financial Resource Strain (CARDIA)    Difficulty of Paying Living Expenses: Not hard at all  Food Insecurity: No Food Insecurity (05/18/2023)   Hunger Vital Sign    Worried About Running Out of Food in the Last Year: Never true    Ran Out of Food in the Last Year: Never true  Transportation Needs: No Transportation Needs (05/18/2023)   PRAPARE - Administrator, Civil Service (Medical): No    Lack of Transportation (Non-Medical): No  Physical Activity: Insufficiently Active (05/18/2023)   Exercise Vital Sign    Days of Exercise per Week: 3 days    Minutes of Exercise per Session: 30 min  Stress: No Stress Concern Present (05/18/2023)   Sandra [name] of Occupational Health - Occupational Stress Questionnaire    Feeling of Stress : Not at all  Social Connections: Moderately Isolated (05/18/2023)   Social Connection and Isolation Panel [NHANES]    Frequency of Communication with Friends and Family: More than three times a week    Frequency of Social Gatherings with Friends and Family: Twice a week    Attends Religious Services: More than 4 times per year    Active Member of [employer] or Organizations: No    Attends Banker Meetings: Never    Marital Status: Widowed    Tobacco Counseling Counseling given: Not Answered    Clinical Intake:  Pre-visit preparation completed: Yes  Pain : No/denies pain     Nutritional Risks: None Diabetes: No  No results found for: "HGBA1C"   How often do you need to have someone help you when you read instructions, pamphlets,  or other written materials from your doctor or pharmacy?: 1 - Never  Interpreter Needed?: No  Information entered by :: Dellie Fergusson, LPN   Activities of Daily Living    05/18/2023    9:38 AM 09/16/2022    1:19 PM  In your present state of health, do you have any difficulty performing the following activities:  Hearing? 0 0  Vision? 0 0  Difficulty concentrating or making decisions? 0 0  Walking or climbing stairs? 0 1  Dressing or bathing? 0 0  Doing errands, shopping? 0 0  Preparing Food and eating ? N   Using the Toilet? N   In the past six months, have you accidently leaked urine? Y   Do you have problems with loss  of bowel control? N   Managing your Medications? N   Managing your Finances? N   Housekeeping or managing your Housekeeping? N     Patient Care Team: Sowles, Krichna, MD as PCP - General (Family Medicine) [facility], [facility] (Optometry)  Indicate any recent Medical Services you may have received from other than [facility] providers in the past year (date may be approximate).     Assessment:   This is a routine wellness examination for Sandra [name] .  Hearing/Vision screen Hearing Screening - Comments:: NO AIDS Vision Screening - Comments:: WEARS GLASSES ALL DAY- [facility]   Goals Addressed             This Visit's Progress    DIET - EAT MORE FRUITS AND VEGETABLES         Depression Screen     05/18/2023    9:34 AM 09/16/2022    1:19 PM 05/12/2022   11:12 AM 04/11/2022    2:25 PM 09/17/2021   10:46 AM 05/06/2021   11:49 AM 06/17/2020    2:55 PM  PHQ 2/9 Scores  PHQ - 2 Score 0 0 0 0 0 0 0  PHQ- 9 Score 0 0  0 0      Fall Risk     05/18/2023    9:38 AM 09/16/2022    1:19 PM 05/12/2022   11:08 AM 04/11/2022    2:25 PM 09/17/2021   10:46 AM  Fall Risk   Falls in the past year? 1 0 1 1 0  Number falls in past yr: 0 0 0 0 0  Injury with Fall? 1 0 1 1 0  Comment   fractured rt hip    Risk for fall due to : History of fall(s);Impaired mobility Orthopedic  patient No Fall Risks History of fall(s);Impaired balance/gait No Fall Risks  Follow up Falls evaluation completed;Falls prevention discussed Falls prevention discussed;Education provided;Falls evaluation completed Education provided;Falls prevention discussed Falls prevention discussed;Education provided;Falls evaluation completed Falls prevention discussed;Education provided    MEDICARE RISK AT HOME:  Medicare Risk at Home Any stairs in or around the home?: No If so, are there any without handrails?: No Home free of loose throw rugs in walkways, pet beds, electrical cords, etc?: Yes Adequate lighting in your home to reduce risk of falls?: Yes Life alert?: No Use of a cane, walker or w/c?: Yes (CANE SINCE HIP SURGERY) Grab bars in the bathroom?: Yes Shower chair or bench in shower?: No Elevated toilet seat or a handicapped toilet?: No  TIMED UP AND GO:  Was the test performed?  No  Cognitive Function: 6CIT completed        05/18/2023    9:39 AM 05/12/2022   11:18 AM 04/21/2020    3:13 PM 02/14/2019    2:59 PM  6CIT Screen  What Year? 0 points 0 points 0 points 0 points  What month? 0 points 0 points 0 points 0 points  What time? 0 points 0 points 0 points 0 points  Count back from 20 0 points 0 points 0 points 0 points  Months in reverse 2 points 0 points 0 points 0 points  Repeat phrase 0 points 2 points 0 points 4 points  Total Score 2 points 2 points 0 points 4 points    Immunizations Immunization History  Administered Date(s) Administered   Fluad Trivalent(High Dose 65+) 09/16/2022   PFIZER(Purple Top)SARS-COV-2 Vaccination 03/20/2019, 04/07/2019   PPD Test 08/24/2018   Pneumococcal Conjugate-13 10/27/2014   Pneumococcal  Polysaccharide-23 11/22/2010    Screening Tests Health Maintenance  Topic Date Due   DTaP/Tdap/Td (1 - Tdap) Never done   Zoster Vaccines- Shingrix (1 of 2) Never done   COVID-19 Vaccine (3 - 2024-25 season) 09/18/2022   INFLUENZA VACCINE   08/18/2023   Medicare Annual Wellness (AWV)  05/17/2024   Pneumonia Vaccine 23+ Years old  Completed   HPV VACCINES  Aged Out   Meningococcal B Vaccine  Aged Out   DEXA SCAN  Discontinued    Health Maintenance  Health Maintenance Due  Topic Date Due   DTaP/Tdap/Td (1 - Tdap) Never done   Zoster Vaccines- Shingrix (1 of 2) Never done   COVID-19 Vaccine (3 - 2024-25 season) 09/18/2022   Health Maintenance Items Addressed: WANTS NO MORE SHOTS; AGED OUT OF BDS, MAMMOGRAM & COLONOSCOPY  Additional Screening:  Vision Screening: Recommended annual ophthalmology exams for early detection of glaucoma and other disorders of the eye.  Dental Screening: Recommended annual dental exams for proper oral hygiene  Community Resource Referral / Chronic Care Management: CRR required this visit?  No   CCM required this visit?  No     Plan:     I have personally reviewed and noted the following in the patient's chart:   Medical and social history Use of alcohol, tobacco or illicit drugs  Current medications and supplements including opioid prescriptions. Patient is not currently taking opioid prescriptions. Functional ability and status Nutritional status Physical activity Advanced directives List of other physicians Hospitalizations, surgeries, and ER visits in previous 12 months Vitals Screenings to include cognitive, depression, and falls Referrals and appointments  In addition, I have reviewed and discussed with patient certain preventive protocols, quality metrics, and best practice recommendations. A written personalized care plan for preventive services as well as general preventive health recommendations were provided to patient.     Pinky Bright, LPN   09/17/4780   After Visit Summary: (MyChart) Due to this being a telephonic visit, the after visit summary with patients personalized plan was offered to patient via MyChart   Notes: Nothing significant to report at this  time.

## 2023-05-18 NOTE — Patient Instructions (Addendum)
 Sandra [name] , Thank you for taking time to come for your Medicare Wellness Visit. I appreciate your ongoing commitment to your health goals. Please review the following plan we discussed and let me know if I can assist you in the future.   Referrals/Orders/Follow-Ups/Clinician Recommendations: NONE  This is a list of the screening recommended for you and due dates:  Health Maintenance  Topic Date Due   DTaP/Tdap/Td vaccine (1 - Tdap) Never done   Zoster (Shingles) Vaccine (1 of 2) Never done   COVID-19 Vaccine (3 - 2024-25 season) 09/18/2022   Flu Shot  08/18/2023   Medicare Annual Wellness Visit  05/17/2024   Pneumonia Vaccine  Completed   HPV Vaccine  Aged Out   Meningitis B Vaccine  Aged Out   DEXA scan (bone density measurement)  Discontinued    Advanced directives: (ACP Link)Information on Advanced Care Planning can be found at [facility] Advance Health Care Directives Advance Health Care Directives. [email or web address]   Next Medicare Annual Wellness Visit scheduled for next year: Yes  05/30/24 @ 8:10 AM BY PHONE

## 2023-05-30 ENCOUNTER — Other Ambulatory Visit: Payer: Self-pay | Admitting: Family Medicine

## 2023-05-30 DIAGNOSIS — I10 Essential (primary) hypertension: Secondary | ICD-10-CM

## 2023-05-30 NOTE — Telephone Encounter (Unsigned)
 Copied from CRM [id number]. Topic: Clinical - Medication Refill >> May 30, 2023  8:15 AM Alysia Jumbo S wrote: Medication: lisinopril  (ZESTRIL ) 2.5 MG tablet  Has the patient contacted their pharmacy? Yes (Agent: If no, request that the patient contact the pharmacy for the refill. If patient does not wish to contact the pharmacy document the reason why and proceed with request.) (Agent: If yes, when and what did the pharmacy advise?)  This is the patient's preferred pharmacy:  [facility] [address], [place] - [address] [place] [address] Phone: [phone] Fax: [phone]  Is this the correct pharmacy for this prescription? Yes If no, delete pharmacy and type the correct one.   Has the prescription been filled recently? No  Is the patient out of the medication? Yes, patient will be going out of town on Friday.   Has the patient been seen for an appointment in the last year OR does the patient have an upcoming appointment? Yes  Can we respond through MyChart? No  Agent: Please be advised that Rx refills may take up to 3 business days. We ask that you follow-up with your pharmacy.

## 2023-05-31 MED ORDER — LISINOPRIL 2.5 MG PO TABS: 2.5000 mg | ORAL_TABLET | Freq: Every day | ORAL | 0 refills | Status: DC

## 2023-05-31 NOTE — Telephone Encounter (Signed)
 Requested Prescriptions  Pending Prescriptions Disp Refills   lisinopril  (ZESTRIL ) 2.5 MG tablet 90 tablet 0    Sig: Take 1 tablet (2.5 mg total) by mouth daily.     Cardiovascular:  ACE Inhibitors Failed - 05/31/2023  3:58 PM      Failed - Cr in normal range and within 180 days    Creat  Date Value Ref Range Status  09/16/2022 0.77 0.60 - 0.95 mg/dL Final         Failed - K in normal range and within 180 days    Potassium  Date Value Ref Range Status  09/16/2022 4.3 3.5 - 5.3 mmol/L Final         Passed - Patient is not pregnant      Passed - Last BP in normal range    BP Readings from Last 1 Encounters:  09/16/22 126/70         Passed - Valid encounter within last 6 months    Recent Outpatient Visits   None

## 2023-07-07 ENCOUNTER — Ambulatory Visit: Admitting: Family Medicine

## 2023-10-13 ENCOUNTER — Telehealth: Payer: Self-pay

## 2023-10-13 ENCOUNTER — Other Ambulatory Visit: Payer: Self-pay | Admitting: Family Medicine

## 2023-10-13 DIAGNOSIS — I10 Essential (primary) hypertension: Secondary | ICD-10-CM

## 2023-10-13 MED ORDER — LISINOPRIL 2.5 MG PO TABS: 2.5000 mg | ORAL_TABLET | Freq: Every day | ORAL | 0 refills | Status: DC

## 2023-10-13 NOTE — Telephone Encounter (Signed)
 Last time seen August 2024

## 2023-10-13 NOTE — Telephone Encounter (Signed)
 Copied from CRM [id number]. Topic: Clinical - Medical Advice >> Oct 13, 2023  8:38 AM Amy B wrote: Reason for CRM: Patient rescheduled her appointment from 10/20 to next year on 01/20 and wants to know if she can still get refills of her medications even though she rescheduled to January.

## 2023-10-13 NOTE — Telephone Encounter (Signed)
 Pt is scheduled for 10/31/23 due to transportation

## 2023-10-30 ENCOUNTER — Other Ambulatory Visit: Payer: Self-pay

## 2023-10-30 DIAGNOSIS — I739 Peripheral vascular disease, unspecified: Secondary | ICD-10-CM

## 2023-10-30 NOTE — Telephone Encounter (Signed)
 She was just seen in aug., states she is out of town til Dec. Does not need meds at this time.

## 2023-10-30 NOTE — Telephone Encounter (Signed)
 She had an appt. For tomorrow, she had to cancel b/c out of town

## 2023-10-30 NOTE — Telephone Encounter (Signed)
 Copied from CRM [id number]. Topic: Clinical - Medication Question >> Oct 30, 2023 10:10 AM Sandra [name] wrote: Reason for CRM: Patient needs to reschedule her appointment for tomorrow due to being out of town. First available is in December. Wants to make sure she will be able to get refills of her medication if she reschedules. Is requesting a callback from the office.  Patient can be reached at [phone]

## 2023-10-31 ENCOUNTER — Ambulatory Visit: Payer: Medicare (Managed Care) | Admitting: Family Medicine

## 2023-11-02 NOTE — Telephone Encounter (Unsigned)
 Copied from CRM [id number]. Topic: Clinical - Medication Refill >> Nov 02, 2023  2:45 PM Hadassah PARAS wrote: Medication: atorvastatin  (LIPITOR) 10 MG tablet   Has the patient contacted their pharmacy? No (Agent: If no, request that the patient contact the pharmacy for the refill. If patient does not wish to contact the pharmacy document the reason why and proceed with request.) (Agent: If yes, when and what did the pharmacy advise?)  This is the patient's preferred pharmacy:  [facility] [address], [place] - [address] [place] [address] Phone: [phone] Fax: [phone]  Is this the correct pharmacy for this prescription? Yes If no, delete pharmacy and type the correct one.   Has the prescription been filled recently? No  Is the patient out of the medication? Yes  Has the patient been seen for an appointment in the last year OR does the patient have an upcoming appointment? Yes  Can we respond through MyChart? no  Agent: Please be advised that Rx refills may take up to 3 business days. We ask that you follow-up with your pharmacy.

## 2023-11-02 NOTE — Addendum Note (Signed)
 Addended by: GRETTA LAURAINE HERO on: 11/02/2023 03:36 PM   Modules accepted: Orders

## 2023-11-02 NOTE — Telephone Encounter (Signed)
 Requested medication (s) are due for refill today: Yes  Requested medication (s) are on the active medication list: Yes  Last refill:  12/27/22  Future visit scheduled: Yes  Notes to clinic:  Unable to refill per protocol due to failed labs, no updated results.      Requested Prescriptions  Pending Prescriptions Disp Refills   atorvastatin  (LIPITOR) 10 MG tablet 90 tablet 3    Sig: Take 1 tablet (10 mg total) by mouth daily.     Cardiovascular:  Antilipid - Statins Failed - 11/02/2023  3:38 PM      Failed - Lipid Panel in normal range within the last 12 months    Cholesterol, Total  Date Value Ref Range Status  10/27/2014 204 (H) 100 - 199 mg/dL Final   Cholesterol  Date Value Ref Range Status  09/16/2022 206 (H) <200 mg/dL Final   LDL Cholesterol (Calc)  Date Value Ref Range Status  09/16/2022 127 (H) mg/dL (calc) Final    Comment:    Reference range: <100 . Desirable range <100 mg/dL for primary prevention;   <70 mg/dL for patients with CHD or diabetic patients  with > or = 2 CHD risk factors. SABRA LDL-C is now calculated using the Martin-Hopkins  calculation, which is a validated novel method providing  better accuracy than the Friedewald equation in the  estimation of LDL-C.  Gladis APPLETHWAITE et al. SANDREA. 7986;689(80): 2061-2068  (http://education.[facility].com/faq/FAQ164)    HDL  Date Value Ref Range Status  09/16/2022 63 > OR = 50 mg/dL Final  89/89/7983 70 >60 mg/dL Final    Comment:    According to ATP-III Guidelines, HDL-C >59 mg/dL is considered a negative risk factor for CHD.    Triglycerides  Date Value Ref Range Status  09/16/2022 64 <150 mg/dL Final         Passed - Patient is not pregnant      Passed - Valid encounter within last 12 months    Recent Outpatient Visits   None

## 2023-11-06 ENCOUNTER — Ambulatory Visit: Admitting: Family Medicine

## 2023-12-08 NOTE — Progress Notes (Signed)
 Pharmacy Quality Measure Review  This patient is appearing on a report for being at risk of failing the adherence measure for hypertension (ACEi/ARB) medications this calendar year.   Medication: lisinopril  Last fill date: 11/26/23 for 30 day supply  Insurance report was not up to date. No action needed at this time.   Aubrielle Stroud E. Marsh, PharmD, CPP Clinical Pharmacist [facility] [phone]

## 2023-12-18 ENCOUNTER — Telehealth: Payer: Self-pay | Admitting: Family Medicine

## 2023-12-18 DIAGNOSIS — I10 Essential (primary) hypertension: Secondary | ICD-10-CM

## 2023-12-18 NOTE — Telephone Encounter (Unsigned)
 Copied from CRM #[id number]. Topic: Clinical - Medication Refill >> Dec 18, 2023  8:55 AM Zy'onna H wrote: Medication:  lisinopril  lisinopril  (ZESTRIL ) 2.5 MG tablet  **Patient is requesting a refill ahead of time due to having to reschedule her most recent visit due to having a procedure**  Has the patient contacted their pharmacy? Yes (Agent: If no, request that the patient contact the pharmacy for the refill. If patient does not wish to contact the pharmacy document the reason why and proceed with request.) (Agent: If yes, when and what did the pharmacy advise?)  This is the patient's preferred pharmacy:  [facility] [address], [place] - [address] [place] [address] Phone: [phone] Fax: [phone]  Is this the correct pharmacy for this prescription? Yes If no, delete pharmacy and type the correct one.   Has the prescription been filled recently? Yes  Is the patient out of the medication? Yes  Has the patient been seen for an appointment in the last year OR does the patient have an upcoming appointment? Yes  Can we respond through MyChart? No  Agent: Please be advised that Rx refills may take up to 3 business days. We ask that you follow-up with your pharmacy.

## 2023-12-21 ENCOUNTER — Ambulatory Visit: Payer: Medicare (Managed Care) | Admitting: Family Medicine

## 2023-12-21 MED ORDER — LISINOPRIL 2.5 MG PO TABS: 2.5000 mg | ORAL_TABLET | Freq: Every day | ORAL | 0 refills | Status: DC

## 2023-12-21 NOTE — Telephone Encounter (Signed)
 Requested Prescriptions  Pending Prescriptions Disp Refills   lisinopril  (ZESTRIL ) 2.5 MG tablet 30 tablet 0    Sig: Take 1 tablet (2.5 mg total) by mouth daily.     Cardiovascular:  ACE Inhibitors Failed - 12/21/2023 11:26 AM      Failed - Cr in normal range and within 180 days    Creat  Date Value Ref Range Status  09/16/2022 0.77 0.60 - 0.95 mg/dL Final         Failed - K in normal range and within 180 days    Potassium  Date Value Ref Range Status  09/16/2022 4.3 3.5 - 5.3 mmol/L Final         Failed - Valid encounter within last 6 months    Recent Outpatient Visits   None            Passed - Patient is not pregnant      Passed - Last BP in normal range    BP Readings from Last 1 Encounters:  09/16/22 126/70

## 2024-01-02 ENCOUNTER — Telehealth: Payer: Self-pay

## 2024-01-02 NOTE — Progress Notes (Signed)
 Pharmacy Quality Measure Review  This patient is appearing on a report for being at risk of failing the adherence measure for hypertension (ACEi/ARB) medications this calendar year.   Medication: lisinopril  Last fill date: 12/26/23 for 30 day supply  Insurance report was not up to date. No action needed at this time.   Lovina Zuver E. Marsh, PharmD, CPP Clinical Pharmacist [facility] [phone]

## 2024-01-24 NOTE — Telephone Encounter (Signed)
 Pt couldn't come tomorrow. She has an appt on1/19/26 and says she still has a 2 wk supply

## 2024-01-24 NOTE — Telephone Encounter (Signed)
 Pt requesting courtesy refill on Lisinopril . Last time she saw you was 08/2022 and you gave her already a 30 day supply in Dec 2025

## 2024-01-24 NOTE — Telephone Encounter (Signed)
 Pt did reschedule her appt and has questions about her meds

## 2024-01-24 NOTE — Telephone Encounter (Signed)
 Copied from CRM #[id number]. Topic: Appointments - Scheduling Inquiry for Clinic >> Jan 24, 2024  9:35 AM Everette C wrote: Reason for CRM: The patient would like to be contacted by a member of clinical staff to discuss tentatively rescheduling their 02/06/24 appt with their PCP. The patient would like to discuss medication continuity prior to rescheduling. Please contact when possible

## 2024-01-30 ENCOUNTER — Ambulatory Visit: Payer: Medicare (Managed Care) | Admitting: Family Medicine

## 2024-02-02 ENCOUNTER — Other Ambulatory Visit: Payer: Self-pay | Admitting: Family Medicine

## 2024-02-02 ENCOUNTER — Telehealth: Payer: Self-pay

## 2024-02-02 DIAGNOSIS — I10 Essential (primary) hypertension: Secondary | ICD-10-CM

## 2024-02-02 NOTE — Telephone Encounter (Signed)
 Requested Prescriptions  Refused Prescriptions Disp Refills   lisinopril  (ZESTRIL ) 2.5 MG tablet 30 tablet 0    Sig: Take 1 tablet (2.5 mg total) by mouth daily.     Cardiovascular:  ACE Inhibitors Failed - 02/02/2024  3:10 PM      Failed - Cr in normal range and within 180 days    Creat  Date Value Ref Range Status  09/16/2022 0.77 0.60 - 0.95 mg/dL Final         Failed - K in normal range and within 180 days    Potassium  Date Value Ref Range Status  09/16/2022 4.3 3.5 - 5.3 mmol/L Final         Failed - Valid encounter within last 6 months    Recent Outpatient Visits   None            Passed - Patient is not pregnant      Passed - Last BP in normal range    BP Readings from Last 1 Encounters:  09/16/22 126/70

## 2024-02-02 NOTE — Telephone Encounter (Signed)
 Copied from CRM [id number]. Topic: Clinical - Medication Question >> Feb 02, 2024  9:16 AM Pinkey ORN wrote: Reason for CRM: Medication Request >> Feb 02, 2024  9:17 AM Pinkey ORN wrote: Patient called in, wanting to know if she could get her lisinopril  (ZESTRIL ) 2.5 MG tablet called into the pharmacy for another month until she's strong enough to come into the office.

## 2024-02-02 NOTE — Telephone Encounter (Signed)
 Requested medications are due for refill today.  yes  Requested medications are on the active medications list.  yes  Last refill. 12/21/2023 #30 [id number]  Future visit scheduled.   yes  Notes to clinic.  Expired labs    Requested Prescriptions  Pending Prescriptions Disp Refills   lisinopril  (ZESTRIL ) 2.5 MG tablet [Pharmacy Med Name: Lisinopril  2.5 MG Oral Tablet] 30 tablet 0    Sig: Take 1 tablet by mouth once daily     Cardiovascular:  ACE Inhibitors Failed - 02/02/2024  1:50 PM      Failed - Cr in normal range and within 180 days    Creat  Date Value Ref Range Status  09/16/2022 0.77 0.60 - 0.95 mg/dL Final         Failed - K in normal range and within 180 days    Potassium  Date Value Ref Range Status  09/16/2022 4.3 3.5 - 5.3 mmol/L Final         Failed - Valid encounter within last 6 months    Recent Outpatient Visits   None            Passed - Patient is not pregnant      Passed - Last BP in normal range    BP Readings from Last 1 Encounters:  09/16/22 126/70

## 2024-02-02 NOTE — Telephone Encounter (Unsigned)
 Copied from CRM [id number]. Topic: Clinical - Medication Refill >> Feb 02, 2024 10:55 AM Lonell PEDLAR wrote: Medication:  lisinopril  (ZESTRIL ) 2.5 MG tablet  Has the patient contacted their pharmacy? No, pt will reach out to pharmacy after she speaks w pcp office (Agent: If no, request that the patient contact the pharmacy for the refill. If patient does not wish to contact the pharmacy document the reason why and proceed with request.) (Agent: If yes, when and what did the pharmacy advise?)  This is the patient's preferred pharmacy:  [facility] [address], [place] - [address] [place] [address] Phone: [phone] Fax: [phone]  Is this the correct pharmacy for this prescription? Yes If no, delete pharmacy and type the correct one.   Has the prescription been filled recently? Yes  Is the patient out of the medication? Yes  Has the patient been seen for an appointment in the last year OR does the patient have an upcoming appointment? Yes  Can we respond through MyChart? No  Agent: Please be advised that Rx refills may take up to 3 business days. We ask that you follow-up with your pharmacy.

## 2024-02-02 NOTE — Telephone Encounter (Signed)
 Copied from CRM [id number]. Topic: Clinical - Medication Refill >> Feb 02, 2024  1:26 PM Larissa S wrote: Medication: lisinopril  (ZESTRIL ) 2.5 MG tablet  Has the patient contacted their pharmacy? Yes (Agent: If no, request that the patient contact the pharmacy for the refill. If patient does not wish to contact the pharmacy document the reason why and proceed with request.) (Agent: If yes, when and what did the pharmacy advise?)  This is the patient's preferred pharmacy:  [facility] [address] [place] - [address] [place] [address] Phone: [phone] Fax: [phone]  Is this the correct pharmacy for this prescription? Yes If no, delete pharmacy and type the correct one.   Has the prescription been filled recently? No  Is the patient out of the medication? Yes  Has the patient been seen for an appointment in the last year OR does the patient have an upcoming appointment? Yes  Can we respond through MyChart? No. Pt request a call when sent  Agent: Please be advised that Rx refills may take up to 3 business days. We ask that you follow-up with your pharmacy.

## 2024-02-05 ENCOUNTER — Ambulatory Visit: Payer: Medicare (Managed Care) | Admitting: Family Medicine

## 2024-02-06 ENCOUNTER — Ambulatory Visit: Admitting: Family Medicine

## 2024-02-19 ENCOUNTER — Ambulatory Visit: Payer: Self-pay | Admitting: Family Medicine

## 2024-02-19 DIAGNOSIS — I10 Essential (primary) hypertension: Secondary | ICD-10-CM

## 2024-02-19 NOTE — Telephone Encounter (Signed)
 FYI Only or Action Required?: Action required by provider: medication refill request and sch'd 03/19/24.  Patient was last seen in primary care on 09/16/2022 by Glenard Mire, MD.  Called Nurse Triage reporting Medication Refill and Appointment.   Triage Disposition: Call PCP When Office is Open  Patient/caregiver understands and will follow disposition?: Yes    Copied from CRM [id number]. Topic: Clinical - Medication Question >> Feb 19, 2024  9:32 AM Lonell PEDLAR wrote: Reason for CRM: Patient just had surgery on hip due to fall. She is requesting to cx apt of 02/26/24 for now but would like to make sure this will not affect her future refills for lisinopril  (ZESTRIL ) 2.5 MG tablet.    C/b: [phone] Reason for Disposition  [1] Prescription refill request for NON-ESSENTIAL medicine (i.e., no harm to patient if med not taken) AND [2] triager unable to refill per department policy  Answer Assessment - Initial Assessment Questions 1. DRUG NAME: What medicine do you need to have refilled?     Pt called to report recent fall and needing to reschedule appt for 02/26/24. Pt states she recently had surgery and will need to move appt out d/t rehab for hip. Pt requesting refill x 1 for lisinopril  until appt 03/19/24. Discussed refills will be up to provider but rx attached. Pt to keep march appt and knows no further refills until appt. Appointment scheduled for evaluation. Patient agrees with plan of care, and will call back if anything changes, or if symptoms worsen.  Protocols used: Medication Refill and Renewal Call-A-AH

## 2024-02-26 ENCOUNTER — Ambulatory Visit: Admitting: Family Medicine

## 2024-03-01 ENCOUNTER — Ambulatory Visit: Admitting: Family Medicine

## 2024-03-19 ENCOUNTER — Ambulatory Visit: Admitting: Family Medicine

## 2024-05-30 ENCOUNTER — Ambulatory Visit: Payer: Medicare (Managed Care)
